# Patient Record
Sex: Female | Born: 1955 | Race: White | Hispanic: No | Marital: Married | Smoking: Former smoker
Health system: Southern US, Community
[De-identification: ages and names within clinical notes are randomized; demographics above are authoritative.]

## PROBLEM LIST (undated history)

## (undated) DIAGNOSIS — F419 Anxiety disorder, unspecified: Secondary | ICD-10-CM

## (undated) DIAGNOSIS — K219 Gastro-esophageal reflux disease without esophagitis: Secondary | ICD-10-CM

## (undated) DIAGNOSIS — I10 Essential (primary) hypertension: Secondary | ICD-10-CM

## (undated) DIAGNOSIS — F32A Depression, unspecified: Secondary | ICD-10-CM

## (undated) DIAGNOSIS — M1990 Unspecified osteoarthritis, unspecified site: Secondary | ICD-10-CM

## (undated) DIAGNOSIS — R0600 Dyspnea, unspecified: Secondary | ICD-10-CM

## (undated) DIAGNOSIS — C801 Malignant (primary) neoplasm, unspecified: Secondary | ICD-10-CM

## (undated) DIAGNOSIS — G709 Myoneural disorder, unspecified: Secondary | ICD-10-CM

## (undated) DIAGNOSIS — J189 Pneumonia, unspecified organism: Secondary | ICD-10-CM

## (undated) DIAGNOSIS — R634 Abnormal weight loss: Secondary | ICD-10-CM

## (undated) DIAGNOSIS — N189 Chronic kidney disease, unspecified: Secondary | ICD-10-CM

## (undated) DIAGNOSIS — M353 Polymyalgia rheumatica: Secondary | ICD-10-CM

## (undated) DIAGNOSIS — F329 Major depressive disorder, single episode, unspecified: Secondary | ICD-10-CM

## (undated) DIAGNOSIS — D649 Anemia, unspecified: Secondary | ICD-10-CM

## (undated) DIAGNOSIS — E079 Disorder of thyroid, unspecified: Secondary | ICD-10-CM

## (undated) DIAGNOSIS — E039 Hypothyroidism, unspecified: Secondary | ICD-10-CM

## (undated) DIAGNOSIS — G4700 Insomnia, unspecified: Secondary | ICD-10-CM

## (undated) DIAGNOSIS — M109 Gout, unspecified: Secondary | ICD-10-CM

## (undated) HISTORY — DX: Gout, unspecified: M10.9

## (undated) HISTORY — PX: KNEE ARTHROSCOPY: SUR90

## (undated) HISTORY — PX: GASTRIC BYPASS: SHX52

## (undated) HISTORY — PX: EYE SURGERY: SHX253

## (undated) HISTORY — PX: CARPAL TUNNEL RELEASE: SHX101

## (undated) HISTORY — DX: Gastro-esophageal reflux disease without esophagitis: K21.9

## (undated) HISTORY — DX: Depression, unspecified: F32.A

## (undated) HISTORY — PX: JOINT REPLACEMENT: SHX530

## (undated) HISTORY — PX: TOTAL HIP ARTHROPLASTY: SHX124

## (undated) HISTORY — PX: COLONOSCOPY: SHX174

## (undated) HISTORY — PX: TONSILLECTOMY AND ADENOIDECTOMY: SHX28

## (undated) HISTORY — DX: Major depressive disorder, single episode, unspecified: F32.9

## (undated) HISTORY — DX: Disorder of thyroid, unspecified: E07.9

## (undated) HISTORY — DX: Abnormal weight loss: R63.4

---

## 2003-12-19 DIAGNOSIS — E668 Other obesity: Secondary | ICD-10-CM | POA: Insufficient documentation

## 2005-04-01 DIAGNOSIS — M25552 Pain in left hip: Secondary | ICD-10-CM | POA: Insufficient documentation

## 2006-04-19 DIAGNOSIS — G894 Chronic pain syndrome: Secondary | ICD-10-CM | POA: Insufficient documentation

## 2009-01-16 DIAGNOSIS — E034 Atrophy of thyroid (acquired): Secondary | ICD-10-CM | POA: Insufficient documentation

## 2010-07-27 DIAGNOSIS — N189 Chronic kidney disease, unspecified: Secondary | ICD-10-CM

## 2010-07-27 HISTORY — DX: Chronic kidney disease, unspecified: N18.9

## 2010-09-18 DIAGNOSIS — Z9889 Other specified postprocedural states: Secondary | ICD-10-CM | POA: Insufficient documentation

## 2010-09-18 DIAGNOSIS — Z96649 Presence of unspecified artificial hip joint: Secondary | ICD-10-CM | POA: Insufficient documentation

## 2011-09-10 DIAGNOSIS — H81319 Aural vertigo, unspecified ear: Secondary | ICD-10-CM | POA: Insufficient documentation

## 2012-01-04 DIAGNOSIS — M353 Polymyalgia rheumatica: Secondary | ICD-10-CM | POA: Insufficient documentation

## 2012-01-04 DIAGNOSIS — Z8679 Personal history of other diseases of the circulatory system: Secondary | ICD-10-CM | POA: Insufficient documentation

## 2012-10-13 DIAGNOSIS — R0902 Hypoxemia: Secondary | ICD-10-CM | POA: Insufficient documentation

## 2012-12-27 DIAGNOSIS — N179 Acute kidney failure, unspecified: Secondary | ICD-10-CM | POA: Insufficient documentation

## 2015-02-05 DIAGNOSIS — R0602 Shortness of breath: Secondary | ICD-10-CM | POA: Insufficient documentation

## 2015-02-05 DIAGNOSIS — E785 Hyperlipidemia, unspecified: Secondary | ICD-10-CM | POA: Insufficient documentation

## 2015-02-05 DIAGNOSIS — R0609 Other forms of dyspnea: Secondary | ICD-10-CM | POA: Insufficient documentation

## 2016-01-24 DIAGNOSIS — Z9884 Bariatric surgery status: Secondary | ICD-10-CM | POA: Insufficient documentation

## 2016-01-25 HISTORY — PX: DG GALL BLADDER: HXRAD326

## 2017-03-09 DIAGNOSIS — N841 Polyp of cervix uteri: Secondary | ICD-10-CM | POA: Insufficient documentation

## 2017-08-11 ENCOUNTER — Other Ambulatory Visit: Payer: Self-pay

## 2017-08-11 ENCOUNTER — Encounter: Payer: Self-pay | Admitting: Family Medicine

## 2017-08-11 ENCOUNTER — Other Ambulatory Visit
Admission: RE | Admit: 2017-08-11 | Discharge: 2017-08-11 | Disposition: A | Discharge status: Home / Self Care | Payer: Medicare HMO | Source: Ambulatory Visit | Attending: Family Medicine | Admitting: Family Medicine

## 2017-08-11 ENCOUNTER — Ambulatory Visit (INDEPENDENT_AMBULATORY_CARE_PROVIDER_SITE_OTHER): Payer: Medicare HMO | Admitting: Family Medicine

## 2017-08-11 VITALS — BP 118/76 | HR 88 | Resp 16 | Ht 63.0 in | Wt 161.4 lb

## 2017-08-11 DIAGNOSIS — E559 Vitamin D deficiency, unspecified: Secondary | ICD-10-CM

## 2017-08-11 DIAGNOSIS — I10 Essential (primary) hypertension: Secondary | ICD-10-CM | POA: Insufficient documentation

## 2017-08-11 DIAGNOSIS — Z79890 Hormone replacement therapy (postmenopausal): Secondary | ICD-10-CM | POA: Insufficient documentation

## 2017-08-11 DIAGNOSIS — M1A0790 Idiopathic chronic gout, unspecified ankle and foot, without tophus (tophi): Secondary | ICD-10-CM | POA: Diagnosis not present

## 2017-08-11 DIAGNOSIS — M159 Polyosteoarthritis, unspecified: Secondary | ICD-10-CM | POA: Diagnosis not present

## 2017-08-11 DIAGNOSIS — F3289 Other specified depressive episodes: Secondary | ICD-10-CM | POA: Diagnosis not present

## 2017-08-11 DIAGNOSIS — M1990 Unspecified osteoarthritis, unspecified site: Secondary | ICD-10-CM | POA: Diagnosis not present

## 2017-08-11 DIAGNOSIS — G4700 Insomnia, unspecified: Secondary | ICD-10-CM

## 2017-08-11 DIAGNOSIS — Z8739 Personal history of other diseases of the musculoskeletal system and connective tissue: Secondary | ICD-10-CM | POA: Diagnosis not present

## 2017-08-11 DIAGNOSIS — M109 Gout, unspecified: Secondary | ICD-10-CM | POA: Insufficient documentation

## 2017-08-11 DIAGNOSIS — Z87891 Personal history of nicotine dependence: Secondary | ICD-10-CM | POA: Diagnosis not present

## 2017-08-11 DIAGNOSIS — Z79899 Other long term (current) drug therapy: Secondary | ICD-10-CM | POA: Insufficient documentation

## 2017-08-11 DIAGNOSIS — E039 Hypothyroidism, unspecified: Secondary | ICD-10-CM | POA: Diagnosis not present

## 2017-08-11 DIAGNOSIS — M353 Polymyalgia rheumatica: Secondary | ICD-10-CM | POA: Diagnosis not present

## 2017-08-11 DIAGNOSIS — F3341 Major depressive disorder, recurrent, in partial remission: Secondary | ICD-10-CM

## 2017-08-11 DIAGNOSIS — E538 Deficiency of other specified B group vitamins: Secondary | ICD-10-CM | POA: Insufficient documentation

## 2017-08-11 DIAGNOSIS — Z23 Encounter for immunization: Secondary | ICD-10-CM

## 2017-08-11 LAB — SEDIMENTATION RATE: Sed Rate: 21 mm/hr (ref 0–30)

## 2017-08-11 LAB — COMPREHENSIVE METABOLIC PANEL
ALT: 23 U/L (ref 14–54)
AST: 25 U/L (ref 15–41)
Albumin: 4.4 g/dL (ref 3.5–5.0)
Alkaline Phosphatase: 126 U/L (ref 38–126)
Anion gap: 7 (ref 5–15)
BUN: 21 mg/dL — ABNORMAL HIGH (ref 6–20)
CHLORIDE: 104 mmol/L (ref 101–111)
CO2: 25 mmol/L (ref 22–32)
Calcium: 9.2 mg/dL (ref 8.9–10.3)
Creatinine, Ser: 0.83 mg/dL (ref 0.44–1.00)
Glucose, Bld: 88 mg/dL (ref 65–99)
POTASSIUM: 4 mmol/L (ref 3.5–5.1)
Sodium: 136 mmol/L (ref 135–145)
Total Bilirubin: 0.8 mg/dL (ref 0.3–1.2)
Total Protein: 7.1 g/dL (ref 6.5–8.1)

## 2017-08-11 LAB — CBC
HCT: 36.3 % (ref 35.0–47.0)
Hemoglobin: 12.4 g/dL (ref 12.0–16.0)
MCH: 33.1 pg (ref 26.0–34.0)
MCHC: 34.1 g/dL (ref 32.0–36.0)
MCV: 96.9 fL (ref 80.0–100.0)
PLATELETS: 228 10*3/uL (ref 150–440)
RBC: 3.75 MIL/uL — ABNORMAL LOW (ref 3.80–5.20)
RDW: 14.7 % — AB (ref 11.5–14.5)
WBC: 7 10*3/uL (ref 3.6–11.0)

## 2017-08-11 LAB — LIPID PANEL
CHOL/HDL RATIO: 2.4 ratio
CHOLESTEROL: 175 mg/dL (ref 0–200)
HDL: 72 mg/dL (ref >40)
LDL Cholesterol: 86 mg/dL (ref 0–99)
Triglycerides: 84 mg/dL (ref <150)
VLDL: 17 mg/dL (ref 0–40)

## 2017-08-11 LAB — VITAMIN B12: Vitamin B-12: 833 pg/mL (ref 180–914)

## 2017-08-11 LAB — TSH: TSH: 1.392 u[IU]/mL (ref 0.350–4.500)

## 2017-08-11 LAB — URIC ACID: URIC ACID, SERUM: 3.6 mg/dL (ref 2.3–6.6)

## 2017-08-12 ENCOUNTER — Other Ambulatory Visit: Payer: Self-pay | Admitting: Family Medicine

## 2017-08-12 DIAGNOSIS — F5105 Insomnia due to other mental disorder: Secondary | ICD-10-CM | POA: Insufficient documentation

## 2017-08-12 DIAGNOSIS — F329 Major depressive disorder, single episode, unspecified: Secondary | ICD-10-CM | POA: Insufficient documentation

## 2017-08-12 DIAGNOSIS — G4700 Insomnia, unspecified: Secondary | ICD-10-CM

## 2017-08-12 DIAGNOSIS — F32A Depression, unspecified: Secondary | ICD-10-CM | POA: Insufficient documentation

## 2017-08-12 LAB — VITAMIN D 25 HYDROXY (VIT D DEFICIENCY, FRACTURES): VIT D 25 HYDROXY: 58.4 ng/mL (ref 30.0–100.0)

## 2017-08-12 NOTE — Progress Notes (Signed)
Date:  08/11/2017   Name:  Deborah Jimenez   DOB:  10/03/1955   MRN:  [id number]  PCP:  Adline Potter, MD    Chief Complaint: Establish Care   History of Present Illness:  This is a 62 y.o. female seen for initial visit. Moved here from [place] in Nov. S/p gastric bypass 2017, weight down 135#, still losing. Uses walker s/p B THR, most recent 2014. Hx PMR on chronic prednisone in past contributing to joint problems. Uses Elavil 75 mg qhs, was on 25 qhs but increased due to insomnia associated with move., takes Ambien about once a week, on B12/Mg/iron and high dose multivit since 2017. Depression on Effexor x 7 yrs, Wellbutrin XL x 1 yr, seems to help. On Protonix since surgery but thinks can stop. On Mobic x 5 yrs, allopurinol x 2 yrs after gout in big toe, has never tried regular Tylenol. Takes Vicodin prn pain about 1/2 tab per month. Synthroid x 6 yrs TSH ok in July. Biotin for hair/skin. Imms UTD except Prevnar, mammo and colo per chart.  Review of Systems:  Review of Systems  Constitutional: Negative for chills and fever.  HENT: Negative for ear pain and sore throat.   Eyes: Negative for pain.  Respiratory: Negative for cough and shortness of breath.   Cardiovascular: Negative for chest pain and leg swelling.  Gastrointestinal: Negative for abdominal pain, constipation and diarrhea.  Genitourinary: Negative for difficulty urinating.  Neurological: Negative for syncope and light-headedness.  Hematological: Negative for adenopathy.    Patient Active Problem List   Diagnosis Date Noted  . Gout 08/11/2017  . Hypertension 08/11/2017  . Hypothyroidism 08/11/2017  . B12 deficiency 08/11/2017  . Vitamin D deficiency 08/11/2017  . History of polymyalgia rheumatica 08/11/2017  . Osteoarthritis 08/11/2017    Prior to Admission medications   Medication Sig Start Date End Date Taking? Authorizing Provider  acetaminophen (TYLENOL) 500 MG tablet Take 1,000 mg by mouth 2 (two) times  daily.   Yes [provider]  allopurinol (ZYLOPRIM) 300 MG tablet Take 1 tablet by mouth daily. 07/15/17  Yes [provider]  amitriptyline (ELAVIL) 25 MG tablet Take 3 tablets by mouth daily. 06/09/17  Yes [provider]  BIOTIN PO Take 1 tablet by mouth daily.   Yes [provider]  buPROPion (WELLBUTRIN XL) 150 MG 24 hr tablet Take 2 tablets by mouth daily.  08/10/17  Yes [provider]  calcium carbonate (TUMS - DOSED IN MG ELEMENTAL CALCIUM) 500 MG chewable tablet Chew 1 tablet by mouth 2 (two) times daily.   Yes [provider]  Cholecalciferol (VITAMIN D3) 5000 units TABS Take by mouth 2 (two) times a week.   Yes [provider]  EPINEPHrine 0.3 mg/0.3 mL IJ SOAJ injection Inject into the muscle as needed.   Yes [provider]  ferrous sulfate ([employer] IRON) 325 (65 FE) MG tablet Take 325 mg by mouth daily with breakfast.   Yes [provider]  HYDROcodone-acetaminophen (NORCO/VICODIN) 5-325 MG tablet Take 1 tablet by mouth every 8 (eight) hours as needed. 05/14/17  Yes [provider]  levothyroxine (SYNTHROID, LEVOTHROID) 200 MCG tablet Take 1 tablet by mouth daily. 06/10/17  Yes [provider]  levothyroxine (SYNTHROID, LEVOTHROID) 25 MCG tablet Take 1 tablet by mouth daily. 07/15/17  Yes [provider]  lisinopril (PRINIVIL,ZESTRIL) 40 MG tablet Take 0.5 tablets by mouth daily. 07/23/17  Yes [provider]  MAGNESIUM ASPARTATE PO Take 500 mg by  mouth daily.   Yes [provider]  Melatonin 3 MG TABS Take 1 tablet by mouth at bedtime.   Yes [provider]  Multiple Vitamins-Minerals (ALIVE WOMENS GUMMY PO) Take 4 capsules by mouth daily.   Yes [provider]  spironolactone (ALDACTONE) 50 MG tablet Take 1 tablet by mouth daily. 08/10/17  Yes [provider]  venlafaxine (EFFEXOR) 25 MG tablet Take 1 tablet by mouth 2 (two) times daily.  05/31/17  Yes [provider]  vitamin B-12 (CYANOCOBALAMIN) 500 MCG tablet Take 250 mcg by mouth 2 (two) times a week.    Yes [provider]  zolpidem (AMBIEN) 10 MG tablet Take 10 mg by mouth at bedtime as needed for sleep.   Yes [provider]    Allergies  Allergen Reactions  . Bee Pollen   . Penicillins     Past Surgical History:  Procedure Laterality Date  . CESAREAN SECTION    . DG GALL BLADDER    . GASTRIC BYPASS    . TONSILLECTOMY AND ADENOIDECTOMY    . TOTAL HIP ARTHROPLASTY Left    x 2    Social History   Tobacco Use  . Smoking status: Former Smoker    Last attempt to quit: 01/24/2010    Years since quitting: 7.5  . Smokeless tobacco: Never Used  Substance Use Topics  . Alcohol use: Yes    Frequency: Never  . Drug use: No    Family History  Family history unknown: Yes    Medication list has been reviewed and updated.  Physical Examination: BP 118/76   Pulse 88   Resp 16   Ht '5\' 3"'  (1.6 m)   Wt 161 lb 6.4 oz (73.2 kg)   SpO2 98%   BMI 28.59 kg/m   Physical Exam  Constitutional: She is oriented to person, place, and time. She appears well-developed and well-nourished.  HENT:  Head: Normocephalic and atraumatic.  Right Ear: External ear normal.  Left Ear: External ear normal.  Nose: Nose normal.  Mouth/Throat: Oropharynx is clear and moist.  TMs clear  Eyes: Conjunctivae and EOM are normal. Pupils are equal, round, and reactive to light. No scleral icterus.  Neck: Neck supple. No thyromegaly present.  Cardiovascular: Normal rate, regular rhythm and normal heart sounds.  Pulmonary/Chest: Effort normal and breath sounds normal.  Abdominal: Soft. She exhibits no distension and no mass. There is no tenderness.  Musculoskeletal: She exhibits no edema.  Lymphadenopathy:    She has no cervical adenopathy.  Neurological: She is alert and oriented to person, place, and time. Coordination normal.  Romberg positive, gait  unsteady due to pain  Skin: Skin is warm and dry.  Psychiatric: She has a normal mood and affect. Her behavior is normal.  Nursing note and vitals reviewed.   Assessment and Plan:  1. Essential hypertension Well controlled on lisinopril/Aldactone - Comprehensive Metabolic Panel (CMET) - CBC - Lipid Profile  2. Hypothyroidism, unspecified type Well controlled on Synthroid - TSH  3. Chronic idiopathic gout involving toe without tophus, unspecified laterality Well controlled on allopurinol - Uric acid  4. Osteoarthritis of multiple joints, unspecified osteoarthritis type S/p steroid arthropathy, B THR, change Mobic to Tylenol 1000 mg bid - Ambulatory referral to Physical Therapy  5. Vitamin D deficiency On supplement - Vitamin D (25 hydroxy)  6. B12 deficiency On supplement - B12  7. Insomnia, unspecified type Taper off Elavil, cont melatonin, prn Ambien for now  8. Recurrent major depressive  disorder, in partial remission ([place]) Well controlled on Effexor/Wellbutrin XL, consider taper once off Elavil  9. History of polymyalgia rheumatica - Sed Rate (ESR)  10. Need for influenza vaccination - Flu Vaccine QUAD 6+ mos PF IM (Fluarix Quad PF)  11. Need for pneumococcal vaccination - Pneumococcal conjugate vaccine 13-valent  12. Med review D/c Protonix, consider d/c iron/Mg  Return in about 4 weeks (around 09/08/2017).   One hour spent with pt over half in counseling  Vern[facility]  08/12/2017

## 2017-08-18 ENCOUNTER — Ambulatory Visit: Payer: Medicare HMO | Admitting: Physical Therapy

## 2017-09-02 DIAGNOSIS — M25551 Pain in right hip: Secondary | ICD-10-CM | POA: Diagnosis not present

## 2017-09-02 DIAGNOSIS — M25562 Pain in left knee: Secondary | ICD-10-CM | POA: Diagnosis not present

## 2017-09-02 DIAGNOSIS — M25561 Pain in right knee: Secondary | ICD-10-CM | POA: Diagnosis not present

## 2017-09-02 DIAGNOSIS — M25552 Pain in left hip: Secondary | ICD-10-CM | POA: Diagnosis not present

## 2017-09-02 DIAGNOSIS — R262 Difficulty in walking, not elsewhere classified: Secondary | ICD-10-CM | POA: Diagnosis not present

## 2017-09-03 ENCOUNTER — Encounter: Payer: Self-pay | Admitting: Family Medicine

## 2017-09-03 ENCOUNTER — Other Ambulatory Visit: Payer: Self-pay | Admitting: Family Medicine

## 2017-09-03 MED ORDER — PANTOPRAZOLE SODIUM 40 MG PO TBEC: 40.0000 mg | DELAYED_RELEASE_TABLET | Freq: Every day | ORAL | 3 refills | Status: DC

## 2017-09-03 NOTE — Telephone Encounter (Signed)
Patient mychart message.

## 2017-09-06 DIAGNOSIS — M25551 Pain in right hip: Secondary | ICD-10-CM | POA: Diagnosis not present

## 2017-09-06 DIAGNOSIS — R262 Difficulty in walking, not elsewhere classified: Secondary | ICD-10-CM | POA: Diagnosis not present

## 2017-09-06 DIAGNOSIS — M25561 Pain in right knee: Secondary | ICD-10-CM | POA: Diagnosis not present

## 2017-09-06 DIAGNOSIS — M25562 Pain in left knee: Secondary | ICD-10-CM | POA: Diagnosis not present

## 2017-09-06 DIAGNOSIS — M25552 Pain in left hip: Secondary | ICD-10-CM | POA: Diagnosis not present

## 2017-09-08 ENCOUNTER — Encounter: Payer: Self-pay | Admitting: Family Medicine

## 2017-09-08 ENCOUNTER — Ambulatory Visit (INDEPENDENT_AMBULATORY_CARE_PROVIDER_SITE_OTHER): Payer: Medicare HMO | Admitting: Family Medicine

## 2017-09-08 VITALS — Ht 63.0 in | Wt 163.0 lb

## 2017-09-08 DIAGNOSIS — G4700 Insomnia, unspecified: Secondary | ICD-10-CM

## 2017-09-08 DIAGNOSIS — M25561 Pain in right knee: Secondary | ICD-10-CM | POA: Diagnosis not present

## 2017-09-08 DIAGNOSIS — M25551 Pain in right hip: Secondary | ICD-10-CM | POA: Diagnosis not present

## 2017-09-08 DIAGNOSIS — M159 Polyosteoarthritis, unspecified: Secondary | ICD-10-CM | POA: Diagnosis not present

## 2017-09-08 DIAGNOSIS — E039 Hypothyroidism, unspecified: Secondary | ICD-10-CM

## 2017-09-08 DIAGNOSIS — M1A0790 Idiopathic chronic gout, unspecified ankle and foot, without tophus (tophi): Secondary | ICD-10-CM

## 2017-09-08 DIAGNOSIS — I10 Essential (primary) hypertension: Secondary | ICD-10-CM

## 2017-09-08 DIAGNOSIS — F3341 Major depressive disorder, recurrent, in partial remission: Secondary | ICD-10-CM

## 2017-09-08 DIAGNOSIS — E538 Deficiency of other specified B group vitamins: Secondary | ICD-10-CM | POA: Diagnosis not present

## 2017-09-08 DIAGNOSIS — M7522 Bicipital tendinitis, left shoulder: Secondary | ICD-10-CM | POA: Diagnosis not present

## 2017-09-08 DIAGNOSIS — K219 Gastro-esophageal reflux disease without esophagitis: Secondary | ICD-10-CM

## 2017-09-08 DIAGNOSIS — E559 Vitamin D deficiency, unspecified: Secondary | ICD-10-CM

## 2017-09-08 DIAGNOSIS — R262 Difficulty in walking, not elsewhere classified: Secondary | ICD-10-CM | POA: Diagnosis not present

## 2017-09-08 DIAGNOSIS — M25552 Pain in left hip: Secondary | ICD-10-CM | POA: Diagnosis not present

## 2017-09-08 DIAGNOSIS — M25562 Pain in left knee: Secondary | ICD-10-CM | POA: Diagnosis not present

## 2017-09-08 MED ORDER — CELECOXIB 100 MG PO CAPS: 100.0000 mg | ORAL_CAPSULE | Freq: Two times a day (BID) | ORAL | 0 refills | Status: DC

## 2017-09-08 MED ORDER — NAPROXEN 500 MG PO TABS: 500.0000 mg | ORAL_TABLET | Freq: Two times a day (BID) | ORAL | 0 refills | Status: DC

## 2017-09-08 NOTE — Patient Instructions (Signed)
You have biceps tendonitis. Anti-inflammatory medication sent to pharmacy, use for two weeks. Stop spironolactone.

## 2017-09-09 ENCOUNTER — Encounter: Payer: Self-pay | Admitting: Family Medicine

## 2017-09-09 DIAGNOSIS — K219 Gastro-esophageal reflux disease without esophagitis: Secondary | ICD-10-CM | POA: Insufficient documentation

## 2017-09-09 DIAGNOSIS — Z9884 Bariatric surgery status: Secondary | ICD-10-CM | POA: Insufficient documentation

## 2017-09-09 NOTE — Progress Notes (Signed)
Date:  09/08/2017   Name:  Deborah Jimenez   DOB:  09/28/1955   MRN:  [id number]  PCP:  Adline Potter, MD    Chief Complaint: Follow-up and Arm Pain (Left arm. States it has been hurting since she recieved pneumonia shot. )   History of Present Illness:  This is a 62 y.o. female seen for one month f/u from initial visit. Elavil stopped and Mobic changed to Tylenol bid, sleeping ok but feels OA pain worse off Mobic, seeing PT which is helping. Also tried stopping Protonix but GERD sxs recurred so restarted. Feels she needs to stay on iron/Mg/Ca supplements given her prior bariatric surgery. Blood work last visit ok. C/o L upper arm pain that started one week after receiving shot, hurts to lift or flex arm. Also gets lightheaded on standing intermittently.   Review of Systems:  Review of Systems  Constitutional: Negative for chills and fever.  Respiratory: Negative for cough and shortness of breath.   Cardiovascular: Negative for chest pain and leg swelling.  Gastrointestinal: Negative for abdominal pain.  Genitourinary: Negative for difficulty urinating.  Neurological: Negative for syncope.    Patient Active Problem List   Diagnosis Date Noted  . Insomnia 08/12/2017  . Depression 08/12/2017  . Gout 08/11/2017  . Hypertension 08/11/2017  . Hypothyroidism 08/11/2017  . B12 deficiency 08/11/2017  . Vitamin D deficiency 08/11/2017  . History of polymyalgia rheumatica 08/11/2017  . Osteoarthritis 08/11/2017    Prior to Admission medications   Medication Sig Start Date End Date Taking? Authorizing Provider  acetaminophen (TYLENOL) 500 MG tablet Take 1,000 mg by mouth 2 (two) times daily.   Yes [provider]  allopurinol (ZYLOPRIM) 300 MG tablet Take 1 tablet by mouth daily. 07/15/17  Yes [provider]  BIOTIN PO Take 1 tablet by mouth daily.   Yes [provider]  buPROPion (WELLBUTRIN XL) 150 MG 24 hr tablet Take 2 tablets by mouth daily.   08/10/17  Yes [provider]  Cholecalciferol (VITAMIN D3) 5000 units TABS Take by mouth 2 (two) times a week.   Yes [provider]  EPINEPHrine 0.3 mg/0.3 mL IJ SOAJ injection Inject into the muscle as needed.   Yes [provider]  HYDROcodone-acetaminophen (NORCO/VICODIN) 5-325 MG tablet Take 1 tablet by mouth every 8 (eight) hours as needed. 05/14/17  Yes [provider]  levothyroxine (SYNTHROID, LEVOTHROID) 200 MCG tablet Take 1 tablet by mouth daily. 06/10/17  Yes [provider]  levothyroxine (SYNTHROID, LEVOTHROID) 25 MCG tablet Take 1 tablet by mouth daily. 07/15/17  Yes [provider]  lisinopril (PRINIVIL,ZESTRIL) 40 MG tablet Take 0.5 tablets by mouth daily. 07/23/17  Yes [provider]  Melatonin 3 MG TABS Take 1 tablet by mouth at bedtime.   Yes [provider]  Multiple Vitamins-Minerals (ALIVE WOMENS GUMMY PO) Take 4 capsules by mouth daily.   Yes [provider]  pantoprazole (PROTONIX) 40 MG tablet Take 1 tablet (40 mg total) by mouth daily. 09/03/17  Yes Stefannie Defeo, Gwyndolyn Saxon, MD  venlafaxine (EFFEXOR) 25 MG tablet Take 1 tablet by mouth 2 (two) times daily. 05/31/17  Yes [provider]  vitamin B-12 (CYANOCOBALAMIN) 500 MCG tablet Take 250 mcg by mouth 2 (two) times a week.    Yes [provider]  celecoxib (CELEBREX) 100 MG capsule Take 1 capsule (100 mg total) by mouth 2 (two) times daily. 09/08/17   Adline Potter, MD    Allergies  Allergen Reactions  . Bee  Pollen   . Penicillins     Past Surgical History:  Procedure Laterality Date  . CESAREAN SECTION    . DG GALL BLADDER    . GASTRIC BYPASS    . TONSILLECTOMY AND ADENOIDECTOMY    . TOTAL HIP ARTHROPLASTY Left    x 2    Social History   Tobacco Use  . Smoking status: Former Smoker    Last attempt to quit: 01/24/2010    Years since quitting: 7.6  . Smokeless tobacco: Never Used  Substance Use Topics  . Alcohol use:  Yes    Frequency: Never  . Drug use: No    Family History  Family history unknown: Yes    Medication list has been reviewed and updated.  Physical Examination: Ht 5\' 3"  (1.6 m)   Wt 163 lb (73.9 kg)   SpO2 99%   BMI 28.87 kg/m   Physical Exam  Constitutional: She appears well-developed and well-nourished.  Cardiovascular: Normal rate, regular rhythm and normal heart sounds.  Pulmonary/Chest: Effort normal and breath sounds normal.  Musculoskeletal: She exhibits no edema.  Markedly tender over L biceps tendon  Neurological: She is alert.  Skin: Skin is warm and dry.  Psychiatric: She has a normal mood and affect. Her behavior is normal.  Nursing note and vitals reviewed.   Assessment and Plan:  1. Recurrent major depressive disorder, in partial remission (HCC) Stable on Effexor/Wellbutrin XL, consider increased Effexor (may help with chronic pain), taper Wellbutrin next visit  2. Biceps tendonitis, left Celebrex 100 mg bid x 2 wks only (intolerant Naprosyn), pt to discuss with PT  3. Essential hypertension Orthostatics positive, d/c Aldactone, cont lisinopril  4. Hypothyroidism, unspecified type Well controlled on Synthroid  5. Osteoarthritis of multiple joints, unspecified osteoarthritis type Pain worse off Mobic, cont Tylenol bid, may improve with short-term Celebrex and further PT  6. Chronic idiopathic gout involving toe without tophus, unspecified laterality Well controlled on allopurinol  7. Insomnia, unspecified type Stable off Elavil, on melatonin, d/c Ambien  8. Gastroesophageal reflux disease, esophagitis presence not specified Intolerant Protonix d/c, consider taper or change to H2B in future  9. B12 deficiency Well controlled on supplement  10. Vitamin D deficiency Well controlled on supplement  11. Med review Prefers to stay on iron/Mg/Ca supplements given prior bariatric surgery  Return in about 4 weeks (around 10/06/2017).  Satira Anis.  [facility]  09/09/2017

## 2017-09-11 ENCOUNTER — Encounter: Payer: Self-pay | Admitting: Family Medicine

## 2017-09-13 DIAGNOSIS — R262 Difficulty in walking, not elsewhere classified: Secondary | ICD-10-CM | POA: Diagnosis not present

## 2017-09-13 DIAGNOSIS — M25561 Pain in right knee: Secondary | ICD-10-CM | POA: Diagnosis not present

## 2017-09-13 DIAGNOSIS — M25551 Pain in right hip: Secondary | ICD-10-CM | POA: Diagnosis not present

## 2017-09-13 DIAGNOSIS — M25552 Pain in left hip: Secondary | ICD-10-CM | POA: Diagnosis not present

## 2017-09-13 DIAGNOSIS — M25562 Pain in left knee: Secondary | ICD-10-CM | POA: Diagnosis not present

## 2017-09-13 NOTE — Telephone Encounter (Signed)
Patient mychart message. Please Advise.

## 2017-09-15 DIAGNOSIS — M25552 Pain in left hip: Secondary | ICD-10-CM | POA: Diagnosis not present

## 2017-09-15 DIAGNOSIS — M25562 Pain in left knee: Secondary | ICD-10-CM | POA: Diagnosis not present

## 2017-09-15 DIAGNOSIS — M25551 Pain in right hip: Secondary | ICD-10-CM | POA: Diagnosis not present

## 2017-09-15 DIAGNOSIS — M25561 Pain in right knee: Secondary | ICD-10-CM | POA: Diagnosis not present

## 2017-09-15 DIAGNOSIS — R262 Difficulty in walking, not elsewhere classified: Secondary | ICD-10-CM | POA: Diagnosis not present

## 2017-09-20 DIAGNOSIS — M25552 Pain in left hip: Secondary | ICD-10-CM | POA: Diagnosis not present

## 2017-09-20 DIAGNOSIS — M25551 Pain in right hip: Secondary | ICD-10-CM | POA: Diagnosis not present

## 2017-09-20 DIAGNOSIS — R262 Difficulty in walking, not elsewhere classified: Secondary | ICD-10-CM | POA: Diagnosis not present

## 2017-09-20 DIAGNOSIS — M25561 Pain in right knee: Secondary | ICD-10-CM | POA: Diagnosis not present

## 2017-09-20 DIAGNOSIS — M25562 Pain in left knee: Secondary | ICD-10-CM | POA: Diagnosis not present

## 2017-09-22 DIAGNOSIS — M25562 Pain in left knee: Secondary | ICD-10-CM | POA: Diagnosis not present

## 2017-09-22 DIAGNOSIS — R262 Difficulty in walking, not elsewhere classified: Secondary | ICD-10-CM | POA: Diagnosis not present

## 2017-09-22 DIAGNOSIS — M25551 Pain in right hip: Secondary | ICD-10-CM | POA: Diagnosis not present

## 2017-09-22 DIAGNOSIS — M25552 Pain in left hip: Secondary | ICD-10-CM | POA: Diagnosis not present

## 2017-09-22 DIAGNOSIS — M25561 Pain in right knee: Secondary | ICD-10-CM | POA: Diagnosis not present

## 2017-09-24 ENCOUNTER — Other Ambulatory Visit: Payer: Self-pay

## 2017-09-24 ENCOUNTER — Other Ambulatory Visit: Payer: Self-pay | Admitting: Family Medicine

## 2017-09-24 MED ORDER — CELECOXIB 100 MG PO CAPS: 100.0000 mg | ORAL_CAPSULE | Freq: Two times a day (BID) | ORAL | 0 refills | Status: DC

## 2017-09-27 ENCOUNTER — Encounter: Payer: Self-pay | Admitting: Family Medicine

## 2017-09-27 ENCOUNTER — Ambulatory Visit (INDEPENDENT_AMBULATORY_CARE_PROVIDER_SITE_OTHER): Payer: Medicare HMO | Admitting: Family Medicine

## 2017-09-27 VITALS — BP 128/78 | HR 68 | Temp 98.2°F | Resp 16 | Ht 63.0 in | Wt 159.0 lb

## 2017-09-27 DIAGNOSIS — F3341 Major depressive disorder, recurrent, in partial remission: Secondary | ICD-10-CM | POA: Diagnosis not present

## 2017-09-27 DIAGNOSIS — M25552 Pain in left hip: Secondary | ICD-10-CM | POA: Diagnosis not present

## 2017-09-27 DIAGNOSIS — G4700 Insomnia, unspecified: Secondary | ICD-10-CM

## 2017-09-27 DIAGNOSIS — K219 Gastro-esophageal reflux disease without esophagitis: Secondary | ICD-10-CM | POA: Diagnosis not present

## 2017-09-27 DIAGNOSIS — Z9884 Bariatric surgery status: Secondary | ICD-10-CM

## 2017-09-27 DIAGNOSIS — M25561 Pain in right knee: Secondary | ICD-10-CM | POA: Diagnosis not present

## 2017-09-27 DIAGNOSIS — I10 Essential (primary) hypertension: Secondary | ICD-10-CM | POA: Diagnosis not present

## 2017-09-27 DIAGNOSIS — R262 Difficulty in walking, not elsewhere classified: Secondary | ICD-10-CM | POA: Diagnosis not present

## 2017-09-27 DIAGNOSIS — M25551 Pain in right hip: Secondary | ICD-10-CM | POA: Diagnosis not present

## 2017-09-27 DIAGNOSIS — E039 Hypothyroidism, unspecified: Secondary | ICD-10-CM

## 2017-09-27 DIAGNOSIS — H5712 Ocular pain, left eye: Secondary | ICD-10-CM | POA: Diagnosis not present

## 2017-09-27 DIAGNOSIS — M25562 Pain in left knee: Secondary | ICD-10-CM | POA: Diagnosis not present

## 2017-09-27 DIAGNOSIS — M159 Polyosteoarthritis, unspecified: Secondary | ICD-10-CM

## 2017-09-27 NOTE — Progress Notes (Signed)
Date:  09/27/2017   Name:  Deborah Jimenez   DOB:  18-Jun-1956   MRN:  [id number]  PCP:  Adline Potter, MD    Chief Complaint: Eye Problem ( L eye irritation...redness and dry on skin only. Itching and draining in morning. Some pain also. Not swollen and not hot. Never crusting over and started around 09/09/2017. Complains of some blurred vision and watery eye. )   History of Present Illness:  This is a 62 y.o. female seen same day for L eye pain past month, saw [facility], placed on olopatadine gtts without improvement, watery d/c but no purulence, worse end of day, no photophobia. Depression improving with increased activity. Off Aldactone for orthostasis. Still taking Elavil and Ambien for insomnia but has cut back. GERD sxs resolved back on Protonix. OA sxs improving with PT, biceps tendonitis also improved.  Review of Systems:  Review of Systems  Constitutional: Negative for chills and fever.  HENT: Negative for ear pain and sinus pain.   Respiratory: Negative for cough and shortness of breath.   Cardiovascular: Negative for chest pain and leg swelling.  Genitourinary: Negative for difficulty urinating.  Neurological: Negative for syncope and light-headedness.    Patient Active Problem List   Diagnosis Date Noted  . GERD (gastroesophageal reflux disease) 09/09/2017  . History of bariatric surgery 09/09/2017  . Insomnia 08/12/2017  . Depression 08/12/2017  . Gout 08/11/2017  . Hypertension 08/11/2017  . Hypothyroidism 08/11/2017  . B12 deficiency 08/11/2017  . Vitamin D deficiency 08/11/2017  . History of polymyalgia rheumatica 08/11/2017  . Osteoarthritis 08/11/2017    Prior to Admission medications   Medication Sig Start Date End Date Taking? Authorizing Provider  acetaminophen (TYLENOL) 500 MG tablet Take 1,000 mg by mouth 2 (two) times daily.   Yes [provider]  allopurinol (ZYLOPRIM) 300 MG tablet Take 1 tablet by mouth daily. 07/15/17  Yes [provider]  BIOTIN PO Take 1 tablet by mouth daily.   Yes [provider]  buPROPion (WELLBUTRIN XL) 150 MG 24 hr tablet Take 2 tablets by mouth daily.  08/10/17  Yes [provider]  Cholecalciferol (VITAMIN D3) 5000 units TABS Take by mouth 2 (two) times a week.   Yes [provider]  EPINEPHrine 0.3 mg/0.3 mL IJ SOAJ injection Inject into the muscle as needed.   Yes [provider]  HYDROcodone-acetaminophen (NORCO/VICODIN) 5-325 MG tablet Take 1 tablet by mouth every 8 (eight) hours as needed. 05/14/17  Yes [provider]  levothyroxine (SYNTHROID, LEVOTHROID) 200 MCG tablet Take 1 tablet by mouth daily. 06/10/17  Yes [provider]  levothyroxine (SYNTHROID, LEVOTHROID) 25 MCG tablet Take 1 tablet by mouth daily. 07/15/17  Yes [provider]  lisinopril (PRINIVIL,ZESTRIL) 40 MG tablet Take 0.5 tablets by mouth daily. 07/23/17  Yes [provider]  Melatonin 3 MG TABS Take 1 tablet by mouth at bedtime.   Yes [provider]  Multiple Vitamins-Minerals (ALIVE WOMENS GUMMY PO) Take 4 capsules by mouth daily.   Yes [provider]  pantoprazole (PROTONIX) 40 MG tablet Take 1 tablet (40 mg total) by mouth daily. 09/03/17  Yes Dnya Hickle, Gwyndolyn Saxon, MD  venlafaxine (EFFEXOR) 25 MG tablet Take 1 tablet by mouth 2 (two) times daily. 05/31/17  Yes [provider]  vitamin B-12 (CYANOCOBALAMIN) 500 MCG tablet Take 250 mcg by mouth 2 (two) times a week.    Yes [provider]    Allergies  Allergen Reactions  . Bee  Pollen   . Latex   . Naprosyn [Naproxen] Diarrhea  . Penicillins     Past Surgical History:  Procedure Laterality Date  . CESAREAN SECTION    . DG GALL BLADDER    . GASTRIC BYPASS    . TONSILLECTOMY AND ADENOIDECTOMY    . TOTAL HIP ARTHROPLASTY Left    x 2    Social History   Tobacco Use  . Smoking status: Former Smoker    Last attempt to quit: 01/24/2010    Years since  quitting: 7.6  . Smokeless tobacco: Never Used  Substance Use Topics  . Alcohol use: Yes    Frequency: Never  . Drug use: No    Family History  Family history unknown: Yes    Medication list has been reviewed and updated.  Physical Examination: BP 128/78   Pulse 68   Temp 98.2 F (36.8 C) (Oral)   Resp 16   Ht 5\' 3"  (1.6 m)   Wt 159 lb (72.1 kg)   SpO2 98%   BMI 28.17 kg/m   Physical Exam  Constitutional: She appears well-developed and well-nourished.  Eyes: EOM are normal. Pupils are equal, round, and reactive to light.  L periorbital erythema L conjunctiva sl injected No FB appreciated  Cardiovascular: Normal rate, regular rhythm and normal heart sounds.  Pulmonary/Chest: Effort normal and breath sounds normal.  Neurological: She is alert.  Skin: Skin is warm and dry.  Psychiatric: She has a normal mood and affect. Her behavior is normal.  Nursing note and vitals reviewed.   Assessment and Plan:  1. Left eye pain, persistent Unclear etiology, unresponsive to olopatadine gtts, will ask optho to see next day or two - Ambulatory referral to Ophthalmology  2. Recurrent major depressive disorder, in partial remission (HCC) Stable on Effexor/Wellbutrin XR, consider increasing Effexor and weaning Wellbutrin to help with chronic pain  3. Essential hypertension Improved off Aldactone, on lisinopril  4. Gastroesophageal reflux disease, esophagitis presence not specified Well controlled on Protonix, intolerant d/c  5. Hypothyroidism, unspecified type Well controlled on Synthroid  6. Insomnia, unspecified type D/c Ambien, try to wean off Elavil, consider change to Pamelor or Remeron if ineffective  7. Osteoarthritis of multiple joints, unspecified osteoarthritis type Improved with PT, Tylenol bid  8. History of bariatric surgery Prefers to stay on multiple supplements, B12, vit D levels ok  Return in about 1 week (around 10/04/2017).  Satira Anis. [facility]  09/27/2017

## 2017-09-28 DIAGNOSIS — G4700 Insomnia, unspecified: Secondary | ICD-10-CM | POA: Diagnosis not present

## 2017-09-28 DIAGNOSIS — Z6828 Body mass index (BMI) 28.0-28.9, adult: Secondary | ICD-10-CM | POA: Diagnosis not present

## 2017-09-28 DIAGNOSIS — E663 Overweight: Secondary | ICD-10-CM | POA: Diagnosis not present

## 2017-09-28 DIAGNOSIS — E039 Hypothyroidism, unspecified: Secondary | ICD-10-CM | POA: Diagnosis not present

## 2017-09-28 DIAGNOSIS — Z9049 Acquired absence of other specified parts of digestive tract: Secondary | ICD-10-CM | POA: Diagnosis not present

## 2017-09-28 DIAGNOSIS — F3340 Major depressive disorder, recurrent, in remission, unspecified: Secondary | ICD-10-CM | POA: Diagnosis not present

## 2017-09-28 DIAGNOSIS — M109 Gout, unspecified: Secondary | ICD-10-CM | POA: Diagnosis not present

## 2017-09-28 DIAGNOSIS — H04222 Epiphora due to insufficient drainage, left lacrimal gland: Secondary | ICD-10-CM | POA: Diagnosis not present

## 2017-09-28 DIAGNOSIS — K219 Gastro-esophageal reflux disease without esophagitis: Secondary | ICD-10-CM | POA: Diagnosis not present

## 2017-09-28 DIAGNOSIS — I10 Essential (primary) hypertension: Secondary | ICD-10-CM | POA: Diagnosis not present

## 2017-09-30 DIAGNOSIS — R262 Difficulty in walking, not elsewhere classified: Secondary | ICD-10-CM | POA: Diagnosis not present

## 2017-09-30 DIAGNOSIS — M25552 Pain in left hip: Secondary | ICD-10-CM | POA: Diagnosis not present

## 2017-09-30 DIAGNOSIS — M25562 Pain in left knee: Secondary | ICD-10-CM | POA: Diagnosis not present

## 2017-09-30 DIAGNOSIS — M25561 Pain in right knee: Secondary | ICD-10-CM | POA: Diagnosis not present

## 2017-09-30 DIAGNOSIS — M25551 Pain in right hip: Secondary | ICD-10-CM | POA: Diagnosis not present

## 2017-10-01 ENCOUNTER — Telehealth: Payer: Self-pay

## 2017-10-01 ENCOUNTER — Other Ambulatory Visit: Payer: Self-pay | Admitting: Family Medicine

## 2017-10-01 MED ORDER — BUPROPION HCL 100 MG PO TABS: 100.0000 mg | ORAL_TABLET | Freq: Two times a day (BID) | ORAL | 3 refills | Status: DC

## 2017-10-01 NOTE — Telephone Encounter (Signed)
Refill needed for Buproprion NOT Extended Release

## 2017-10-01 NOTE — Telephone Encounter (Signed)
Rx for Wellbutrin 100 mg bid sent (does not come in 150 mg tablets).

## 2017-10-04 ENCOUNTER — Ambulatory Visit (INDEPENDENT_AMBULATORY_CARE_PROVIDER_SITE_OTHER): Payer: Medicare HMO | Admitting: Family Medicine

## 2017-10-04 ENCOUNTER — Encounter: Payer: Self-pay | Admitting: Family Medicine

## 2017-10-04 VITALS — BP 112/78 | HR 68 | Ht 63.0 in | Wt 161.1 lb

## 2017-10-04 DIAGNOSIS — G4700 Insomnia, unspecified: Secondary | ICD-10-CM

## 2017-10-04 DIAGNOSIS — E538 Deficiency of other specified B group vitamins: Secondary | ICD-10-CM

## 2017-10-04 DIAGNOSIS — F3341 Major depressive disorder, recurrent, in partial remission: Secondary | ICD-10-CM | POA: Diagnosis not present

## 2017-10-04 DIAGNOSIS — Z9884 Bariatric surgery status: Secondary | ICD-10-CM

## 2017-10-04 DIAGNOSIS — I10 Essential (primary) hypertension: Secondary | ICD-10-CM | POA: Diagnosis not present

## 2017-10-04 DIAGNOSIS — K219 Gastro-esophageal reflux disease without esophagitis: Secondary | ICD-10-CM | POA: Diagnosis not present

## 2017-10-04 DIAGNOSIS — E039 Hypothyroidism, unspecified: Secondary | ICD-10-CM | POA: Diagnosis not present

## 2017-10-04 DIAGNOSIS — M159 Polyosteoarthritis, unspecified: Secondary | ICD-10-CM

## 2017-10-04 DIAGNOSIS — E559 Vitamin D deficiency, unspecified: Secondary | ICD-10-CM | POA: Diagnosis not present

## 2017-10-04 MED ORDER — BUPROPION HCL 100 MG PO TABS: 50.0000 mg | ORAL_TABLET | Freq: Two times a day (BID) | ORAL | 3 refills | Status: DC

## 2017-10-04 MED ORDER — VENLAFAXINE HCL 50 MG PO TABS: 50.0000 mg | ORAL_TABLET | Freq: Two times a day (BID) | ORAL | 2 refills | Status: DC

## 2017-10-04 NOTE — Progress Notes (Signed)
Date:  10/04/2017   Name:  Deborah Jimenez   DOB:  05/13/1956   MRN:  [id number]  PCP:  Adline Potter, MD    Chief Complaint: Hypertension and Eye Problem (much better will see plastic surgeon for droop eye and tear duct issues )   History of Present Illness:  This is a 62 y.o. female seen for one month f/u. Saw optho, told lid sag blocking tear ducts, eye gtts stopped, scheduled to see plastic surgeon. Depression marginally controlled on Effexor/Wellbutrin. GERD ok on daily PPI, intolerant d/c, H2B ineffective in past. Weaning off Elavil, down to 25 mg qhs, sleeping ok. OA ok with PT/Tylenol  Review of Systems:  Review of Systems  Constitutional: Negative for chills and fever.  Respiratory: Negative for cough and shortness of breath.   Cardiovascular: Negative for chest pain and leg swelling.  Genitourinary: Negative for difficulty urinating.  Neurological: Negative for syncope and light-headedness.    Patient Active Problem List   Diagnosis Date Noted  . GERD (gastroesophageal reflux disease) 09/09/2017  . History of bariatric surgery 09/09/2017  . Insomnia 08/12/2017  . Depression 08/12/2017  . Gout 08/11/2017  . Hypertension 08/11/2017  . Hypothyroidism 08/11/2017  . B12 deficiency 08/11/2017  . Vitamin D deficiency 08/11/2017  . History of polymyalgia rheumatica 08/11/2017  . Osteoarthritis 08/11/2017    Prior to Admission medications   Medication Sig Start Date End Date Taking? Authorizing Provider  acetaminophen (TYLENOL) 500 MG tablet Take 1,000 mg by mouth 2 (two) times daily.   Yes [provider]  allopurinol (ZYLOPRIM) 300 MG tablet Take 1 tablet by mouth daily. 07/15/17  Yes [provider]  BIOTIN PO Take 1 tablet by mouth daily.   Yes [provider]  buPROPion (WELLBUTRIN) 100 MG tablet Take 0.5 tablets (50 mg total) by mouth 2 (two) times daily. 10/04/17  Yes Tresha Muzio, Gwyndolyn Saxon, MD  Cholecalciferol (VITAMIN D3) 5000 units TABS  Take by mouth 2 (two) times a week.   Yes [provider]  EPINEPHrine 0.3 mg/0.3 mL IJ SOAJ injection Inject into the muscle as needed.   Yes [provider]  HYDROcodone-acetaminophen (NORCO/VICODIN) 5-325 MG tablet Take 1 tablet by mouth every 8 (eight) hours as needed. 05/14/17  Yes [provider]  levothyroxine (SYNTHROID, LEVOTHROID) 200 MCG tablet Take 1 tablet by mouth daily. 06/10/17  Yes [provider]  levothyroxine (SYNTHROID, LEVOTHROID) 25 MCG tablet Take 1 tablet by mouth daily. 07/15/17  Yes [provider]  lisinopril (PRINIVIL,ZESTRIL) 40 MG tablet Take 0.5 tablets by mouth daily. 07/23/17  Yes [provider]  Melatonin 3 MG TABS Take 1 tablet by mouth at bedtime.   Yes [provider]  Multiple Vitamins-Minerals (ALIVE WOMENS GUMMY PO) Take 4 capsules by mouth daily.   Yes [provider]  pantoprazole (PROTONIX) 40 MG tablet Take 1 tablet (40 mg total) by mouth daily. 09/03/17  Yes Denicia Pagliarulo, Gwyndolyn Saxon, MD  venlafaxine (EFFEXOR) 50 MG tablet Take 1 tablet (50 mg total) by mouth 2 (two) times daily. 10/04/17  Yes Shaton Lore, Gwyndolyn Saxon, MD  vitamin B-12 (CYANOCOBALAMIN) 500 MCG tablet Take 250 mcg by mouth 2 (two) times a week.    Yes [provider]    Allergies  Allergen Reactions  . Bee Pollen   . Latex   . Naprosyn [Naproxen] Diarrhea  . Penicillins     Past Surgical History:  Procedure Laterality Date  . CESAREAN SECTION    . DG GALL BLADDER    .  GASTRIC BYPASS    . TONSILLECTOMY AND ADENOIDECTOMY    . TOTAL HIP ARTHROPLASTY Left    x 2    Social History   Tobacco Use  . Smoking status: Former Smoker    Last attempt to quit: 01/24/2010    Years since quitting: 7.6  . Smokeless tobacco: Never Used  Substance Use Topics  . Alcohol use: Yes    Frequency: Never  . Drug use: No    Family History  Family history unknown: Yes    Medication list has been reviewed and updated.  Physical  Examination: BP 112/78   Pulse 68   Ht 5\' 3"  (1.6 m)   Wt 161 lb 1.6 oz (73.1 kg)   SpO2 99%   BMI 28.54 kg/m   Physical Exam  Constitutional: She appears well-developed and well-nourished.  Eyes:  L periorbital erythema much improved  Cardiovascular: Normal rate, regular rhythm and normal heart sounds.  Pulmonary/Chest: Effort normal and breath sounds normal.  Musculoskeletal:  Trace BLE edema  Neurological: She is alert.  Skin: Skin is warm and dry.  Psychiatric: She has a normal mood and affect. Her behavior is normal.  Nursing note and vitals reviewed.   Assessment and Plan:  1. Recurrent major depressive disorder, in partial remission (HCC) Increase Effexor to 50 mg bid, wean off Wellbutrin (unable to use XL formulations due to bariatric surgery)  2. Essential hypertension Well controlled on lisinopril  3. Gastroesophageal reflux disease, esophagitis presence not specified Well controlled on daily PPI, intolerant d/c, H2B ineffective in past  4. Hypothyroidism, unspecified type Well controlled on Synthroid  5. Osteoarthritis of multiple joints, unspecified osteoarthritis type Adequate control on PT/Tylenol  6. Insomnia, unspecified type Weaning off Elavil  7. B12 deficiency Well controlled on supplement  8. Vitamin D deficiency Well controlled on supplement  9. History of bariatric surgery Pt prefers to continue multiple supplements  Return in about 4 weeks (around 11/01/2017).  Satira Anis. [facility]  10/04/2017

## 2017-10-04 NOTE — Patient Instructions (Signed)
Increase venlafaxine to 50 mg twice daily (new prescription sent). Decrease bupropion to 50 mg (1/2 of 100 mg tablet) twice daily for two weeks then 50 mg (1/2 of 100 mg tablet) daily for two weeks then stop.

## 2017-10-06 ENCOUNTER — Telehealth: Payer: Self-pay

## 2017-10-06 NOTE — Telephone Encounter (Signed)
Called pt to sched AWV w/ [facility]. LVM requesting returned call.  

## 2017-10-11 ENCOUNTER — Ambulatory Visit: Payer: Medicare HMO | Admitting: Family Medicine

## 2017-10-11 DIAGNOSIS — R262 Difficulty in walking, not elsewhere classified: Secondary | ICD-10-CM | POA: Diagnosis not present

## 2017-10-11 DIAGNOSIS — M25561 Pain in right knee: Secondary | ICD-10-CM | POA: Diagnosis not present

## 2017-10-11 DIAGNOSIS — M25562 Pain in left knee: Secondary | ICD-10-CM | POA: Diagnosis not present

## 2017-10-11 DIAGNOSIS — M25551 Pain in right hip: Secondary | ICD-10-CM | POA: Diagnosis not present

## 2017-10-11 DIAGNOSIS — M25552 Pain in left hip: Secondary | ICD-10-CM | POA: Diagnosis not present

## 2017-10-14 DIAGNOSIS — M25552 Pain in left hip: Secondary | ICD-10-CM | POA: Diagnosis not present

## 2017-10-14 DIAGNOSIS — M25562 Pain in left knee: Secondary | ICD-10-CM | POA: Diagnosis not present

## 2017-10-14 DIAGNOSIS — M25561 Pain in right knee: Secondary | ICD-10-CM | POA: Diagnosis not present

## 2017-10-14 DIAGNOSIS — M25551 Pain in right hip: Secondary | ICD-10-CM | POA: Diagnosis not present

## 2017-10-14 DIAGNOSIS — R262 Difficulty in walking, not elsewhere classified: Secondary | ICD-10-CM | POA: Diagnosis not present

## 2017-10-18 DIAGNOSIS — M25551 Pain in right hip: Secondary | ICD-10-CM | POA: Diagnosis not present

## 2017-10-18 DIAGNOSIS — M25561 Pain in right knee: Secondary | ICD-10-CM | POA: Diagnosis not present

## 2017-10-18 DIAGNOSIS — M25552 Pain in left hip: Secondary | ICD-10-CM | POA: Diagnosis not present

## 2017-10-18 DIAGNOSIS — M25562 Pain in left knee: Secondary | ICD-10-CM | POA: Diagnosis not present

## 2017-10-18 DIAGNOSIS — R262 Difficulty in walking, not elsewhere classified: Secondary | ICD-10-CM | POA: Diagnosis not present

## 2017-10-21 DIAGNOSIS — M25552 Pain in left hip: Secondary | ICD-10-CM | POA: Diagnosis not present

## 2017-10-21 DIAGNOSIS — M25551 Pain in right hip: Secondary | ICD-10-CM | POA: Diagnosis not present

## 2017-10-21 DIAGNOSIS — M25561 Pain in right knee: Secondary | ICD-10-CM | POA: Diagnosis not present

## 2017-10-21 DIAGNOSIS — R262 Difficulty in walking, not elsewhere classified: Secondary | ICD-10-CM | POA: Diagnosis not present

## 2017-10-21 DIAGNOSIS — M25562 Pain in left knee: Secondary | ICD-10-CM | POA: Diagnosis not present

## 2017-10-25 DIAGNOSIS — M25561 Pain in right knee: Secondary | ICD-10-CM | POA: Diagnosis not present

## 2017-10-25 DIAGNOSIS — M25562 Pain in left knee: Secondary | ICD-10-CM | POA: Diagnosis not present

## 2017-10-25 DIAGNOSIS — M25551 Pain in right hip: Secondary | ICD-10-CM | POA: Diagnosis not present

## 2017-10-25 DIAGNOSIS — M25552 Pain in left hip: Secondary | ICD-10-CM | POA: Diagnosis not present

## 2017-10-25 DIAGNOSIS — R262 Difficulty in walking, not elsewhere classified: Secondary | ICD-10-CM | POA: Diagnosis not present

## 2017-10-31 ENCOUNTER — Encounter: Payer: Self-pay | Admitting: Family Medicine

## 2017-11-01 NOTE — Telephone Encounter (Signed)
Patient requesting refill. Please Advise.

## 2017-11-02 ENCOUNTER — Other Ambulatory Visit: Payer: Self-pay | Admitting: Family Medicine

## 2017-11-02 DIAGNOSIS — M25561 Pain in right knee: Secondary | ICD-10-CM | POA: Diagnosis not present

## 2017-11-02 DIAGNOSIS — M25562 Pain in left knee: Secondary | ICD-10-CM | POA: Diagnosis not present

## 2017-11-02 DIAGNOSIS — R262 Difficulty in walking, not elsewhere classified: Secondary | ICD-10-CM | POA: Diagnosis not present

## 2017-11-02 DIAGNOSIS — M25551 Pain in right hip: Secondary | ICD-10-CM | POA: Diagnosis not present

## 2017-11-02 DIAGNOSIS — M25552 Pain in left hip: Secondary | ICD-10-CM | POA: Diagnosis not present

## 2017-11-04 ENCOUNTER — Ambulatory Visit: Payer: Medicare HMO

## 2017-11-04 ENCOUNTER — Encounter: Payer: Self-pay | Admitting: Family Medicine

## 2017-11-04 ENCOUNTER — Ambulatory Visit (INDEPENDENT_AMBULATORY_CARE_PROVIDER_SITE_OTHER): Payer: Medicare HMO | Admitting: Family Medicine

## 2017-11-04 VITALS — BP 124/82 | Resp 16 | Ht 63.0 in | Wt 164.1 lb

## 2017-11-04 DIAGNOSIS — M1A0790 Idiopathic chronic gout, unspecified ankle and foot, without tophus (tophi): Secondary | ICD-10-CM

## 2017-11-04 DIAGNOSIS — Z9884 Bariatric surgery status: Secondary | ICD-10-CM

## 2017-11-04 DIAGNOSIS — E538 Deficiency of other specified B group vitamins: Secondary | ICD-10-CM

## 2017-11-04 DIAGNOSIS — M159 Polyosteoarthritis, unspecified: Secondary | ICD-10-CM | POA: Diagnosis not present

## 2017-11-04 DIAGNOSIS — K219 Gastro-esophageal reflux disease without esophagitis: Secondary | ICD-10-CM

## 2017-11-04 DIAGNOSIS — F3341 Major depressive disorder, recurrent, in partial remission: Secondary | ICD-10-CM

## 2017-11-04 DIAGNOSIS — I10 Essential (primary) hypertension: Secondary | ICD-10-CM

## 2017-11-04 DIAGNOSIS — E559 Vitamin D deficiency, unspecified: Secondary | ICD-10-CM

## 2017-11-04 DIAGNOSIS — E039 Hypothyroidism, unspecified: Secondary | ICD-10-CM

## 2017-11-04 DIAGNOSIS — G4700 Insomnia, unspecified: Secondary | ICD-10-CM

## 2017-11-04 MED ORDER — EPINEPHRINE 0.3 MG/0.3ML IJ SOAJ: 0.3000 mg | INTRAMUSCULAR | 2 refills | Status: DC | PRN

## 2017-11-04 MED ORDER — VENLAFAXINE HCL 75 MG PO TABS: 75.0000 mg | ORAL_TABLET | Freq: Two times a day (BID) | ORAL | 2 refills | Status: DC

## 2017-11-04 NOTE — Patient Instructions (Signed)
Increase Effexor to 75 mg twice daily. Stop B12 supplement. Decrease vit D supplement to once weekly. Stop biotin as can interfere with thyroid testing. We will recheck your vitamin and thyroid levels at your next visit.

## 2017-11-04 NOTE — Progress Notes (Signed)
Date:  11/04/2017   Name:  Deborah Jimenez   DOB:  1956-05-13   MRN:  [id number]  PCP:  Adline Potter, MD    Chief Complaint: Depression (F/U ) and Allergies (Need Epi pen)   History of Present Illness:  This is a 62 y.o. female seen for one month f/u. Feeling more depressed, thinks mostly situational. Taking last Wellbutrin dose today. Insomnia still a problem, lots of light and noise in bedroom, still taking Elavil prn. GERD sxs well controlled on daily PPI, OA sxs ok on Tylenol bid. Remains on B12 and vit D supplements twice weekly. Needs Epipen refill.  Review of Systems:  Review of Systems  Constitutional: Negative for chills and fever.  Respiratory: Negative for cough and shortness of breath.   Cardiovascular: Negative for chest pain and leg swelling.  Genitourinary: Negative for difficulty urinating.  Neurological: Negative for syncope and light-headedness.    Patient Active Problem List   Diagnosis Date Noted  . GERD (gastroesophageal reflux disease) 09/09/2017  . History of bariatric surgery 09/09/2017  . Insomnia 08/12/2017  . Depression 08/12/2017  . Gout 08/11/2017  . Hypertension 08/11/2017  . Hypothyroidism 08/11/2017  . B12 deficiency 08/11/2017  . Vitamin D deficiency 08/11/2017  . History of polymyalgia rheumatica 08/11/2017  . Osteoarthritis 08/11/2017    Prior to Admission medications   Medication Sig Start Date End Date Taking? Authorizing Provider  acetaminophen (TYLENOL) 500 MG tablet Take 1,000 mg by mouth 2 (two) times daily.   Yes [provider]  allopurinol (ZYLOPRIM) 300 MG tablet Take 1 tablet by mouth daily. 07/15/17  Yes [provider]  Cholecalciferol (VITAMIN D3) 5000 units TABS Take by mouth once a week.   Yes [provider]  EPINEPHrine 0.3 mg/0.3 mL IJ SOAJ injection Inject 0.3 mLs (0.3 mg total) into the muscle as needed. 11/04/17  Yes Jeaninne Lodico, Gwyndolyn Saxon, MD  HYDROcodone-acetaminophen (NORCO/VICODIN) 5-325 MG  tablet Take 1 tablet by mouth every 8 (eight) hours as needed. 05/14/17  Yes [provider]  levothyroxine (SYNTHROID, LEVOTHROID) 200 MCG tablet Take 1 tablet by mouth daily. 06/10/17  Yes [provider]  levothyroxine (SYNTHROID, LEVOTHROID) 25 MCG tablet Take 1 tablet by mouth daily. 07/15/17  Yes [provider]  lisinopril (PRINIVIL,ZESTRIL) 40 MG tablet Take 0.5 tablets by mouth daily. 07/23/17  Yes [provider]  Melatonin 3 MG TABS Take 1 tablet by mouth at bedtime.   Yes [provider]  Multiple Vitamins-Minerals (ALIVE WOMENS GUMMY PO) Take 4 capsules by mouth daily.   Yes [provider]  pantoprazole (PROTONIX) 40 MG tablet Take 1 tablet (40 mg total) by mouth daily. 09/03/17  Yes Janavia Rottman, Gwyndolyn Saxon, MD  venlafaxine (EFFEXOR) 75 MG tablet Take 1 tablet (75 mg total) by mouth 2 (two) times daily. 11/04/17  Yes Shirlyn Savin, Gwyndolyn Saxon, MD    Allergies  Allergen Reactions  . Bee Pollen   . Latex   . Naprosyn [Naproxen] Diarrhea  . Penicillins     Past Surgical History:  Procedure Laterality Date  . CESAREAN SECTION    . DG GALL BLADDER    . GASTRIC BYPASS    . TONSILLECTOMY AND ADENOIDECTOMY    . TOTAL HIP ARTHROPLASTY Left    x 2    Social History   Tobacco Use  . Smoking status: Former Smoker    Last attempt to quit: 01/24/2010    Years since quitting: 7.7  . Smokeless tobacco: Never Used  Substance Use Topics  . Alcohol  use: Yes    Frequency: Never  . Drug use: No    Family History  Family history unknown: Yes    Medication list has been reviewed and updated.  Physical Examination: BP 124/82   Resp 16   Ht 5\' 3"  (1.6 m)   Wt 164 lb 1.6 oz (74.4 kg)   BMI 29.07 kg/m   Physical Exam  Constitutional: She appears well-developed and well-nourished.  Cardiovascular: Normal rate, regular rhythm and normal heart sounds.  Pulmonary/Chest: Effort normal and breath sounds normal.  Musculoskeletal: She exhibits no  edema.  Neurological: She is alert.  Skin: Skin is warm and dry.  Psychiatric: Her behavior is normal.  Depressed affect  Nursing note and vitals reviewed.   Assessment and Plan:  1. Recurrent major depressive disorder, in partial remission (HCC) Increase Effexor to 75 mg bid (unable to use XL formulation due to bariatric surgery)  2. Essential hypertension Well controlled on lisinopril  3. Gastroesophageal reflux disease, esophagitis presence not specified Well controlled on daily Protonix, intolerant taper  4. Hypothyroidism, unspecified type TSH ok on high dose Synthroid but biotin may be interfering with testing, recommend d/c, recheck TSH next visit  5. Osteoarthritis of multiple joints, unspecified osteoarthritis type Adequate control with PT/Tylenol bid  6. Chronic idiopathic gout involving toe without tophus, unspecified laterality Well controlled on allopurinol  7. Insomnia, unspecified type May improve with increased Effexor, sleep hygiene discussed, avoid Elavil/Ambien, may use melatonin prn  8. Vitamin D deficiency Decrease supplement to weekly, recheck level next visit  9. B12 deficiency D/c low dose supplement, recheck level next visit  10. History of bariatric surgery Weight up 3#, exercise/diet discussed  Return in about 3 months (around 02/03/2018).  Satira Anis. [facility]  11/04/2017

## 2017-11-08 ENCOUNTER — Encounter: Payer: Self-pay | Admitting: Family Medicine

## 2017-11-09 NOTE — Telephone Encounter (Signed)
Patient mychart message. Please Advise.

## 2017-11-11 NOTE — Telephone Encounter (Signed)
Patient response. Thank you.

## 2017-11-18 DIAGNOSIS — H04551 Acquired stenosis of right nasolacrimal duct: Secondary | ICD-10-CM | POA: Diagnosis not present

## 2017-11-25 ENCOUNTER — Encounter: Payer: Self-pay | Admitting: Family Medicine

## 2017-12-05 ENCOUNTER — Encounter: Payer: Self-pay | Admitting: Family Medicine

## 2017-12-06 ENCOUNTER — Other Ambulatory Visit: Payer: Self-pay

## 2017-12-06 ENCOUNTER — Telehealth: Payer: Self-pay

## 2017-12-06 ENCOUNTER — Encounter: Payer: Self-pay | Admitting: Family Medicine

## 2017-12-06 DIAGNOSIS — G4700 Insomnia, unspecified: Secondary | ICD-10-CM

## 2017-12-06 NOTE — Telephone Encounter (Signed)
Putting in a referral to psychiatry for further Ambien use. Left pt a message on her voicemail to indicate this.

## 2017-12-06 NOTE — Telephone Encounter (Signed)
Left message stating that Dr Vicente Masson nor Dr Ronnald Ramp prescribes Ambien. I looked in Dr Chinita Greenland last office note and he "advised against Ambien" told her to "use Melatonin". Also called the pharmacy to see if he prescribed this, he had not "it was prescribed by a doctor in [place]"

## 2017-12-08 DIAGNOSIS — I10 Essential (primary) hypertension: Secondary | ICD-10-CM | POA: Diagnosis not present

## 2017-12-08 DIAGNOSIS — M109 Gout, unspecified: Secondary | ICD-10-CM | POA: Diagnosis not present

## 2017-12-08 DIAGNOSIS — G8929 Other chronic pain: Secondary | ICD-10-CM | POA: Diagnosis not present

## 2017-12-08 DIAGNOSIS — F329 Major depressive disorder, single episode, unspecified: Secondary | ICD-10-CM | POA: Diagnosis not present

## 2017-12-08 DIAGNOSIS — K219 Gastro-esophageal reflux disease without esophagitis: Secondary | ICD-10-CM | POA: Diagnosis not present

## 2017-12-09 ENCOUNTER — Telehealth: Payer: Self-pay

## 2017-12-09 ENCOUNTER — Encounter: Payer: Self-pay | Admitting: Family Medicine

## 2017-12-09 NOTE — Telephone Encounter (Signed)
Pt. Was called and given info on [facility]- phone number, address and walk in hours were left on voice mail

## 2017-12-24 DIAGNOSIS — F17210 Nicotine dependence, cigarettes, uncomplicated: Secondary | ICD-10-CM | POA: Diagnosis not present

## 2017-12-24 DIAGNOSIS — H04202 Unspecified epiphora, left lacrimal gland: Secondary | ICD-10-CM | POA: Diagnosis not present

## 2017-12-24 DIAGNOSIS — F329 Major depressive disorder, single episode, unspecified: Secondary | ICD-10-CM | POA: Diagnosis not present

## 2017-12-24 DIAGNOSIS — E039 Hypothyroidism, unspecified: Secondary | ICD-10-CM | POA: Diagnosis not present

## 2017-12-24 DIAGNOSIS — H04551 Acquired stenosis of right nasolacrimal duct: Secondary | ICD-10-CM | POA: Diagnosis not present

## 2017-12-24 DIAGNOSIS — H04552 Acquired stenosis of left nasolacrimal duct: Secondary | ICD-10-CM | POA: Diagnosis not present

## 2017-12-24 DIAGNOSIS — G4733 Obstructive sleep apnea (adult) (pediatric): Secondary | ICD-10-CM | POA: Diagnosis not present

## 2017-12-24 DIAGNOSIS — E669 Obesity, unspecified: Secondary | ICD-10-CM | POA: Diagnosis not present

## 2017-12-24 DIAGNOSIS — K219 Gastro-esophageal reflux disease without esophagitis: Secondary | ICD-10-CM | POA: Diagnosis not present

## 2017-12-24 DIAGNOSIS — H04223 Epiphora due to insufficient drainage, bilateral lacrimal glands: Secondary | ICD-10-CM | POA: Diagnosis not present

## 2017-12-24 DIAGNOSIS — F419 Anxiety disorder, unspecified: Secondary | ICD-10-CM | POA: Diagnosis not present

## 2017-12-24 HISTORY — PX: EYE SURGERY: SHX253

## 2018-01-03 ENCOUNTER — Other Ambulatory Visit: Payer: Self-pay

## 2018-01-03 ENCOUNTER — Ambulatory Visit (INDEPENDENT_AMBULATORY_CARE_PROVIDER_SITE_OTHER): Payer: Medicare HMO | Admitting: Psychiatry

## 2018-01-03 ENCOUNTER — Encounter: Payer: Self-pay | Admitting: Psychiatry

## 2018-01-03 VITALS — BP 146/79 | HR 80 | Temp 97.5°F | Wt 165.6 lb

## 2018-01-03 DIAGNOSIS — F331 Major depressive disorder, recurrent, moderate: Secondary | ICD-10-CM | POA: Diagnosis not present

## 2018-01-03 DIAGNOSIS — F5105 Insomnia due to other mental disorder: Secondary | ICD-10-CM | POA: Diagnosis not present

## 2018-01-03 DIAGNOSIS — F411 Generalized anxiety disorder: Secondary | ICD-10-CM | POA: Diagnosis not present

## 2018-01-03 MED ORDER — DULOXETINE HCL 30 MG PO CPEP: 30.0000 mg | ORAL_CAPSULE | Freq: Every day | ORAL | 0 refills | Status: DC

## 2018-01-03 MED ORDER — AMITRIPTYLINE HCL 25 MG PO TABS: 75.0000 mg | ORAL_TABLET | Freq: Every day | ORAL | 2 refills | Status: DC

## 2018-01-03 MED ORDER — ZOLPIDEM TARTRATE 10 MG PO TABS: 5.0000 mg | ORAL_TABLET | Freq: Every evening | ORAL | 2 refills | Status: DC | PRN

## 2018-01-03 NOTE — Patient Instructions (Signed)
Duloxetine delayed-release capsules What is this medicine? DULOXETINE (doo LOX e teen) is used to treat depression, anxiety, and different types of chronic pain. This medicine may be used for other purposes; ask your health care provider or pharmacist if you have questions. COMMON BRAND NAME(S): Cymbalta, [place]renka What should I tell my health care provider before I take this medicine? They need to know if you have any of these conditions: -bipolar disorder or a family history of bipolar disorder -glaucoma -kidney disease -liver disease -suicidal thoughts or a previous suicide attempt -taken medicines called MAOIs like Carbex, Eldepryl, Marplan, Nardil, and Parnate within 14 days -an unusual reaction to duloxetine, other medicines, foods, dyes, or preservatives -pregnant or trying to get pregnant -breast-feeding How should I use this medicine? Take this medicine by mouth with a glass of water. Follow the directions on the prescription label. Do not cut, crush or chew this medicine. You can take this medicine with or without food. Take your medicine at regular intervals. Do not take your medicine more often than directed. Do not stop taking this medicine suddenly except upon the advice of your doctor. Stopping this medicine too quickly may cause serious side effects or your condition may worsen. A special MedGuide will be given to you by the pharmacist with each prescription and refill. Be sure to read this information carefully each time. Talk to your pediatrician regarding the use of this medicine in children. While this drug may be prescribed for children as young as 7 years of age for selected conditions, precautions do apply. Overdosage: If you think you have taken too much of this medicine contact a poison control center or emergency room at once. NOTE: This medicine is only for you. Do not share this medicine with others. What if I miss a dose? If you miss a dose, take it as soon as you  can. If it is almost time for your next dose, take only that dose. Do not take double or extra doses. What may interact with this medicine? Do not take this medicine with any of the following medications: -desvenlafaxine -levomilnacipran -linezolid -MAOIs like Carbex, Eldepryl, Marplan, Nardil, and Parnate -methylene blue (injected into a vein) -milnacipran -thioridazine -venlafaxine This medicine may also interact with the following medications: -alcohol -amphetamines -aspirin and aspirin-like medicines -certain antibiotics like ciprofloxacin and enoxacin -certain medicines for blood pressure, heart disease, irregular heart beat -certain medicines for depression, anxiety, or psychotic disturbances -certain medicines for migraine headache like almotriptan, eletriptan, frovatriptan, naratriptan, rizatriptan, sumatriptan, zolmitriptan -certain medicines that treat or prevent blood clots like warfarin, enoxaparin, and dalteparin -cimetidine -fentanyl -lithium -NSAIDS, medicines for pain and inflammation, like ibuprofen or naproxen -phentermine -procarbazine -rasagiline -sibutramine -St. John's wort -theophylline -tramadol -tryptophan This list may not describe all possible interactions. Give your health care provider a list of all the medicines, herbs, non-prescription drugs, or dietary supplements you use. Also tell them if you smoke, drink alcohol, or use illegal drugs. Some items may interact with your medicine. What should I watch for while using this medicine? Tell your doctor if your symptoms do not get better or if they get worse. Visit your doctor or health care professional for regular checks on your progress. Because it may take several weeks to see the full effects of this medicine, it is important to continue your treatment as prescribed by your doctor. Patients and their families should watch out for new or worsening thoughts of suicide or depression. Also watch out for  sudden changes in   feelings such as feeling anxious, agitated, panicky, irritable, hostile, aggressive, impulsive, severely restless, overly excited and hyperactive, or not being able to sleep. If this happens, especially at the beginning of treatment or after a change in dose, call your health care professional. Dennis Bast may get drowsy or dizzy. Do not drive, use machinery, or do anything that needs mental alertness until you know how this medicine affects you. Do not stand or sit up quickly, especially if you are an older patient. This reduces the risk of dizzy or fainting spells. Alcohol may interfere with the effect of this medicine. Avoid alcoholic drinks. This medicine can cause an increase in blood pressure. This medicine can also cause a sudden drop in your blood pressure, which may make you feel faint and increase the chance of a fall. These effects are most common when you first start the medicine or when the dose is increased, or during use of other medicines that can cause a sudden drop in blood pressure. Check with your doctor for instructions on monitoring your blood pressure while taking this medicine. Your mouth may get dry. Chewing sugarless gum or sucking hard candy, and drinking plenty of water may help. Contact your doctor if the problem does not go away or is severe. What side effects may I notice from receiving this medicine? Side effects that you should report to your doctor or health care professional as soon as possible: -allergic reactions like skin rash, itching or hives, swelling of the face, lips, or tongue -anxious -breathing problems -confusion -changes in vision -chest pain -confusion -elevated mood, decreased need for sleep, racing thoughts, impulsive behavior -eye pain -fast, irregular heartbeat -feeling faint or lightheaded, falls -feeling agitated, angry, or irritable -hallucination, loss of contact with reality -high blood pressure -loss of balance or  coordination -palpitations -redness, blistering, peeling or loosening of the skin, including inside the mouth -restlessness, pacing, inability to keep still -seizures -stiff muscles -suicidal thoughts or other mood changes -trouble passing urine or change in the amount of urine -trouble sleeping -unusual bleeding or bruising -unusually weak or tired -vomiting -yellowing of the eyes or skin Side effects that usually do not require medical attention (report to your doctor or health care professional if they continue or are bothersome): -change in sex drive or performance -change in appetite or weight -constipation -dizziness -dry mouth -headache -increased sweating -nausea -tired This list may not describe all possible side effects. Call your doctor for medical advice about side effects. You may report side effects to FDA at 1-800-FDA-1088. Where should I keep my medicine? Keep out of the reach of children. Store at room temperature between 20 and 25 degrees C (68 to 77 degrees F). Throw away any unused medicine after the expiration date. NOTE: This sheet is a summary. It may not cover all possible information. If you have questions about this medicine, talk to your doctor, pharmacist, or health care provider.  2018 Elsevier/Gold Standard (2015-12-12 18:16:03) Zolpidem tablets What is this medicine? ZOLPIDEM (zole PI dem) is used to treat insomnia. This medicine helps you to fall asleep and sleep through the night. This medicine may be used for other purposes; ask your health care provider or pharmacist if you have questions. COMMON BRAND NAME(S): Ambien What should I tell my health care provider before I take this medicine? They need to know if you have any of these conditions: -depression -history of drug abuse or addiction -if you often drink alcohol -liver disease -lung or breathing disease -myasthenia gravis -  sleep apnea -suicidal thoughts, plans, or attempt; a previous  suicide attempt by you or a family member -an unusual or allergic reaction to zolpidem, other medicines, foods, dyes, or preservatives -pregnant or trying to get pregnant -breast-feeding How should I use this medicine? Take this medicine by mouth with a glass of water. Follow the directions on the prescription label. It is better to take this medicine on an empty stomach and only when you are ready for bed. Do not take your medicine more often than directed. If you have been taking this medicine for several weeks and suddenly stop taking it, you may get unpleasant withdrawal symptoms. Your doctor or health care professional may want to gradually reduce the dose. Do not stop taking this medicine on your own. Always follow your doctor or health care professional's advice. A special MedGuide will be given to you by the pharmacist with each prescription and refill. Be sure to read this information carefully each time. Talk to your pediatrician regarding the use of this medicine in children. Special care may be needed. Overdosage: If you think you have taken too much of this medicine contact a poison control center or emergency room at once. NOTE: This medicine is only for you. Do not share this medicine with others. What if I miss a dose? This does not apply. This medicine should only be taken immediately before going to sleep. Do not take double or extra doses. What may interact with this medicine? -alcohol -antihistamines for allergy, cough and cold -certain medicines for anxiety or sleep -certain medicines for depression, like amitriptyline, fluoxetine, sertraline -certain medicines for fungal infections like ketoconazole and itraconazole -certain medicines for seizures like phenobarbital, primidone -ciprofloxacin -dietary supplements for sleep, like valerian or kava kava -general anesthetics like halothane, isoflurane, methoxyflurane, propofol -local anesthetics like lidocaine, pramoxine,  tetracaine -medicines that relax muscles for surgery -narcotic medicines for pain -phenothiazines like chlorpromazine, mesoridazine, prochlorperazine, thioridazine -rifampin This list may not describe all possible interactions. Give your health care provider a list of all the medicines, herbs, non-prescription drugs, or dietary supplements you use. Also tell them if you smoke, drink alcohol, or use illegal drugs. Some items may interact with your medicine. What should I watch for while using this medicine? Visit your doctor or health care professional for regular checks on your progress. Keep a regular sleep schedule by going to bed at about the same time each night. Avoid caffeine-containing drinks in the evening hours. When sleep medicines are used every night for more than a few weeks, they may stop working. Talk to your doctor if you still have trouble sleeping. After taking this medicine for sleep, you may get up out of bed while not being fully awake and do an activity that you do not know you are doing. The next morning, you may have no memory of the event. Activities such as driving a car ("sleep-driving"), making and eating food, talking on the phone, sexual activity, and sleep-walking have been reported. Call your doctor right away if you find out you have done any of these activities. Do not take this medicine if you have used alcohol that evening or before bed or taken another medicine for sleep since your risk of doing these sleep-related activities will be increased. Wait for at least 8 hours after you take a dose before driving or doing other activities that require full mental alertness. Do not take this medicine unless you are able to stay in bed for a full night (7  to 8 hours) before you must be active again. You may have a decrease in mental alertness the day after use, even if you feel that you are fully awake. Tell your doctor if you will need to perform activities requiring full  alertness, such as driving, the next day. Do not stand or sit up quickly after taking this medicine, especially if you are an older patient. This reduces the risk of dizzy or fainting spells. If you or your family notice any changes in your behavior, such as new or worsening depression, thoughts of harming yourself, anxiety, other unusual or disturbing thoughts, or memory loss, call your doctor right away. After you stop taking this medicine, you may have trouble falling asleep. This is called rebound insomnia. This problem usually goes away on its own after 1 or 2 nights. What side effects may I notice from receiving this medicine? Side effects that you should report to your doctor or health care professional as soon as possible: -allergic reactions like skin rash, itching or hives, swelling of the face, lips, or tongue -breathing problems -changes in vision -confusion -depressed mood or other changes in moods or emotions -feeling faint or lightheaded, falls -hallucinations -loss of balance or coordination -loss of memory -numbness or tingling of the tongue -restlessness, excitability, or feelings of anxiety or agitation -signs and symptoms of liver injury like dark yellow or brown urine; general ill feeling or flu-like symptoms; light-colored stools; loss of appetite; nausea; right upper belly pain; unusually weak or tired; yellowing of the eyes or skin -suicidal thoughts -unusual activities while asleep like driving, eating, making phone calls, or sexual activity Side effects that usually do not require medical attention (report to your doctor or health care professional if they continue or are bothersome): -dizziness -drowsiness the day after you take this medicine -headache This list may not describe all possible side effects. Call your doctor for medical advice about side effects. You may report side effects to FDA at 1-800-FDA-1088. Where should I keep my medicine? Keep out of the  reach of children. This medicine can be abused. Keep your medicine in a safe place to protect it from theft. Do not share this medicine with anyone. Selling or giving away this medicine is dangerous and against the law. This medicine may cause accidental overdose and death if taken by other adults, children, or pets. Mix any unused medicine with a substance like cat litter or coffee grounds. Then throw the medicine away in a sealed container like a sealed bag or a coffee can with a lid. Do not use the medicine after the expiration date. Store at room temperature between 20 and 25 degrees C (68 and 77 degrees F). NOTE: This sheet is a summary. It may not cover all possible information. If you have questions about this medicine, talk to your doctor, pharmacist, or health care provider.  2018 Elsevier/Gold Standard (2015-10-16 14:38:20)
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
## 2018-01-11 ENCOUNTER — Telehealth: Payer: Self-pay | Admitting: Psychiatry

## 2018-01-11 MED ORDER — DULOXETINE HCL 30 MG PO CPEP: 30.0000 mg | ORAL_CAPSULE | Freq: Every day | ORAL | 0 refills | Status: DC

## 2018-01-11 NOTE — Telephone Encounter (Signed)
Sent Cymbalta to pharmacy for 30 days.pt has appointment to see me July 1 st week.

## 2018-01-13 ENCOUNTER — Telehealth: Payer: Self-pay

## 2018-01-13 NOTE — Telephone Encounter (Signed)
Patient called stating that at her last office visit if she requested a prescription would be called in to help with her taking the medication Cymbalta. Next appointment is scheduled for 01/24/18. Please review and advise.

## 2018-01-13 NOTE — Telephone Encounter (Signed)
Called and informed patient of message. She voiced understanding

## 2018-01-13 NOTE — Telephone Encounter (Signed)
She is on Elavil and Cymbalta now. More medications changes may be done when she comes in for appointment. But for now I would advice to give cymbalta more time .

## 2018-01-24 ENCOUNTER — Ambulatory Visit (INDEPENDENT_AMBULATORY_CARE_PROVIDER_SITE_OTHER): Payer: Medicare HMO | Admitting: Psychiatry

## 2018-01-24 ENCOUNTER — Encounter: Payer: Self-pay | Admitting: Psychiatry

## 2018-01-24 ENCOUNTER — Other Ambulatory Visit: Payer: Self-pay

## 2018-01-24 VITALS — BP 147/77 | HR 88 | Temp 97.7°F | Wt 166.2 lb

## 2018-01-24 DIAGNOSIS — F5105 Insomnia due to other mental disorder: Secondary | ICD-10-CM

## 2018-01-24 DIAGNOSIS — F331 Major depressive disorder, recurrent, moderate: Secondary | ICD-10-CM | POA: Diagnosis not present

## 2018-01-24 DIAGNOSIS — F411 Generalized anxiety disorder: Secondary | ICD-10-CM

## 2018-01-24 MED ORDER — ZOLPIDEM TARTRATE 10 MG PO TABS: 5.0000 mg | ORAL_TABLET | Freq: Every evening | ORAL | 2 refills | Status: DC | PRN

## 2018-01-24 MED ORDER — DULOXETINE HCL 20 MG PO CPEP: 40.0000 mg | ORAL_CAPSULE | Freq: Every day | ORAL | 2 refills | Status: DC

## 2018-01-24 NOTE — Progress Notes (Signed)
[facility] MD OP Progress Note  01/24/2018 3:22 PM Deborah Jimenez  MRN:  [id number]  Chief Complaint: ' I am here for follow up." Chief Complaint    Follow-up; Medication Refill     HPI: Deborah Jimenez is a 62 year old Caucasian female, married, on disability, lives in [place], has a history of depression, anxiety, chronic pain, history of bariatric surgery, hypothyroidism, vitamin B12, vitamin D deficiency, polymyalgia rheumatica, chronic pain, multiple hip replacement surgeries, recent eye surgery, presented to the clinic today for a follow-up visit.  Patient today reports she had a few days initially when she felt bad with regards to her anxiety symptoms while coming off of the Effexor.  She however reports she feels much better now.  She however is interested in increasing the Cymbalta slightly.  She denies any side effects to the medication.  She reports she is sleeping better.  She continues to use the Ambien as needed.  Patient denies any suicidality.  Patient denies any perceptual disturbances.  Patient reports after the eye surgery she spent some time with her daughter and that helped her a lot.  She reports she and her family took a vacation to the beach which went well.  She denies any other concerns today. Visit Diagnosis:    ICD-10-CM   1. MDD (major depressive disorder), recurrent episode, moderate (HCC) F33.1 DULoxetine (CYMBALTA) 20 MG capsule  2. GAD (generalized anxiety disorder) F41.1   3. Insomnia due to mental condition F51.05 zolpidem (AMBIEN) 10 MG tablet    Past Psychiatric History: Reviewed past psychiatric history from my progress note on 01/03/2018.  Past trials of Elavil, Wellbutrin, Ambien.  Past Medical History:  Past Medical History:  Diagnosis Date  . Depression   . GERD (gastroesophageal reflux disease)   . Gout   . Thyroid disease   . Weight loss     Past Surgical History:  Procedure Laterality Date  . CESAREAN SECTION    . DG GALL BLADDER    . EYE  SURGERY Bilateral   . GASTRIC BYPASS    . TONSILLECTOMY AND ADENOIDECTOMY    . TOTAL HIP ARTHROPLASTY Left    x 2    Family Psychiatric History: Reviewed family psychiatric history from my progress note on 01/03/2018  Family History:  Family History  Problem Relation Age of Onset  . Suicidality Other     Social History: Reviewed social history from my progress note on 01/03/2018. Social History   Socioeconomic History  . Marital status: Married    Spouse name: andy  . Number of children: 1  . Years of education: Not on file  . Highest education level: Master's degree (e.g., MA, MS, MEng, MEd, MSW, MBA)  Occupational History    Comment: disabled   Social Needs  . Financial resource strain: Not hard at all  . Food insecurity:    Worry: Often true    Inability: Often true  . Transportation needs:    Medical: Yes    Non-medical: Yes  Tobacco Use  . Smoking status: Former Smoker    Types: Cigarettes    Last attempt to quit: 01/24/2010    Years since quitting: 8.0  . Smokeless tobacco: Never Used  Substance and Sexual Activity  . Alcohol use: Not Currently    Frequency: Never  . Drug use: No  . Sexual activity: Not Currently  Lifestyle  . Physical activity:    Days per week: 7 days    Minutes per session: 40 min  . Stress: Very  much  Relationships  . Social connections:    Talks on phone: More than three times a week    Gets together: Never    Attends religious service: Never    Active member of club or organization: Yes    Attends meetings of clubs or organizations: More than 4 times per year    Relationship status: Married  Other Topics Concern  . Not on file  Social History Narrative  . Not on file    Allergies:  Allergies  Allergen Reactions  . Bee Pollen   . Latex   . Naprosyn [Naproxen] Diarrhea  . Penicillins     Metabolic Disorder Labs: No results found for: HGBA1C, MPG No results found for: PROLACTIN Lab Results  Component Value Date   CHOL  175 08/11/2017   TRIG 84 08/11/2017   HDL 72 08/11/2017   CHOLHDL 2.4 08/11/2017   VLDL 17 08/11/2017   LDLCALC 86 08/11/2017   Lab Results  Component Value Date   TSH 1.392 08/11/2017    Therapeutic Level Labs: No results found for: LITHIUM No results found for: VALPROATE No components found for:  CBMZ  Current Medications: Current Outpatient Medications  Medication Sig Dispense Refill  . allopurinol (ZYLOPRIM) 300 MG tablet Take 1 tablet by mouth daily.    Marland Kitchen amitriptyline (ELAVIL) 25 MG tablet Take 3 tablets (75 mg total) by mouth at bedtime. 90 tablet 2  . Cholecalciferol (VITAMIN D3) 5000 units TABS Take by mouth once a week.    . DULoxetine (CYMBALTA) 20 MG capsule Take 2 capsules (40 mg total) by mouth daily. 60 capsule 2  . EPINEPHrine 0.3 mg/0.3 mL IJ SOAJ injection Inject 0.3 mLs (0.3 mg total) into the muscle as needed. 1 Device 2  . levothyroxine (SYNTHROID, LEVOTHROID) 200 MCG tablet Take 1 tablet by mouth daily.    Marland Kitchen levothyroxine (SYNTHROID, LEVOTHROID) 25 MCG tablet Take 1 tablet by mouth daily.    Marland Kitchen lisinopril (PRINIVIL,ZESTRIL) 40 MG tablet Take 0.5 tablets by mouth daily.    . Melatonin 3 MG TABS Take 1 tablet by mouth at bedtime.    . meloxicam (MOBIC) 15 MG tablet Take by mouth.    . Multiple Vitamins-Minerals (ALIVE WOMENS GUMMY PO) Take 4 capsules by mouth daily.    . pantoprazole (PROTONIX) 40 MG tablet Take 1 tablet (40 mg total) by mouth daily. 90 tablet 3  . zolpidem (AMBIEN) 10 MG tablet Take 0.5-1 tablets (5-10 mg total) by mouth at bedtime as needed for sleep. 30 tablet 2   No current facility-administered medications for this visit.      Musculoskeletal: Strength & Muscle Tone: within normal limits Gait & Station: normal Patient leans: N/A  Psychiatric Specialty Exam: Review of Systems  Psychiatric/Behavioral: Positive for depression. The patient is nervous/anxious.   All other systems reviewed and are negative.   Blood pressure (!)  147/77, pulse 88, temperature 97.7 F (36.5 C), temperature source Oral, weight 166 lb 3.2 oz (75.4 kg).Body mass index is 29.44 kg/m.  General Appearance: Casual  Eye Contact:  Fair  Speech:  Normal Rate  Volume:  Normal  Mood:  Anxious and Depressed improving  Affect:  Congruent  Thought Process:  Goal Directed and Descriptions of Associations: Intact  Orientation:  Full (Time, Place, and Person)  Thought Content: Logical   Suicidal Thoughts:  No  Homicidal Thoughts:  No  Memory:  Immediate;   Fair Recent;   Fair Remote;   Fair  Judgement:  Fair  Insight:  Fair  Psychomotor Activity:  Normal  Concentration:  Concentration: Fair and Attention Span: Fair  Recall:  [employer] of Knowledge: Fair  Language: Fair  Akathisia:  No  Handed:  Right  AIMS (if indicated): na  Assets:  Communication Skills Desire for Improvement Social Support  ADL's:  Intact  Cognition: WNL  Sleep:  Fair   Screenings: PHQ2-9     Office Visit from 11/04/2017 in [facility] Office Visit from 08/11/2017 in [facility]  PHQ-2 Total Score  5  3  PHQ-9 Total Score  19  6       Assessment and Plan: Anistyn is a 62 year old Caucasian female who has a history of depression, anxiety, insomnia, chronic pain, hypothyroidism, polymyalgia rheumatica, vitamin D, vitamin B12 deficiency, hypertension, chronic pain, history of bariatric surgery, presented to the clinic today for a follow-up visit.  Patient is biologically predisposed given her family history of trauma.  Patient also has psychosocial stressors of multiple medical problems, physical disability, social isolation and so on.  Patient however reports her mood symptoms is getting better and she was able to spend more time with her family which helped.  Discussed plan as noted below.  Plan MDD Increase Cymbalta to 40 mg p.o. Daily. Continue amitriptyline 75 mg p.o. nightly Patient has been referred to Ms. Peacock for  psychotherapy. PHQ 9 - 7  For GAD Increase Cymbalta to 40 mg p.o. Daily Gad 7 = 8  For insomnia Ambien 5-10 mg p.o. nightly as needed  Follow-up in clinic in 6-8 weeks.    More than 50 % of the time was spent for psychoeducation and supportive psychotherapy and care coordination.  This note was generated in part or whole with voice recognition software. Voice recognition is usually quite accurate but there are transcription errors that can and very often do occur. I apologize for any typographical errors that were not detected and corrected.        Ursula Alert, MD 01/24/2018, 3:22 PM

## 2018-02-01 ENCOUNTER — Encounter: Payer: Self-pay | Admitting: Family Medicine

## 2018-02-03 ENCOUNTER — Ambulatory Visit (INDEPENDENT_AMBULATORY_CARE_PROVIDER_SITE_OTHER): Payer: Medicare HMO | Admitting: Family Medicine

## 2018-02-03 ENCOUNTER — Encounter: Payer: Self-pay | Admitting: Family Medicine

## 2018-02-03 VITALS — BP 112/74 | HR 66 | Resp 16 | Ht 63.0 in | Wt 164.0 lb

## 2018-02-03 DIAGNOSIS — K219 Gastro-esophageal reflux disease without esophagitis: Secondary | ICD-10-CM | POA: Diagnosis not present

## 2018-02-03 DIAGNOSIS — I10 Essential (primary) hypertension: Secondary | ICD-10-CM | POA: Diagnosis not present

## 2018-02-03 DIAGNOSIS — M159 Polyosteoarthritis, unspecified: Secondary | ICD-10-CM

## 2018-02-03 DIAGNOSIS — E039 Hypothyroidism, unspecified: Secondary | ICD-10-CM | POA: Diagnosis not present

## 2018-02-03 DIAGNOSIS — E790 Hyperuricemia without signs of inflammatory arthritis and tophaceous disease: Secondary | ICD-10-CM | POA: Diagnosis not present

## 2018-02-03 DIAGNOSIS — M1990 Unspecified osteoarthritis, unspecified site: Secondary | ICD-10-CM | POA: Diagnosis not present

## 2018-02-03 MED ORDER — LEVOTHYROXINE SODIUM 25 MCG PO TABS: 25.0000 ug | ORAL_TABLET | Freq: Every day | ORAL | 1 refills | Status: DC

## 2018-02-03 MED ORDER — LEVOTHYROXINE SODIUM 200 MCG PO TABS: 200.0000 ug | ORAL_TABLET | Freq: Every day | ORAL | 1 refills | Status: DC

## 2018-02-03 MED ORDER — MELOXICAM 15 MG PO TABS: 15.0000 mg | ORAL_TABLET | Freq: Every day | ORAL | 1 refills | Status: DC

## 2018-02-03 MED ORDER — PANTOPRAZOLE SODIUM 40 MG PO TBEC: 40.0000 mg | DELAYED_RELEASE_TABLET | Freq: Every day | ORAL | 1 refills | Status: DC

## 2018-02-03 MED ORDER — ALLOPURINOL 100 MG PO TABS: 100.0000 mg | ORAL_TABLET | Freq: Every day | ORAL | 1 refills | Status: DC

## 2018-02-03 MED ORDER — LISINOPRIL 20 MG PO TABS: 20.0000 mg | ORAL_TABLET | Freq: Every day | ORAL | 1 refills | Status: DC

## 2018-02-03 NOTE — Assessment & Plan Note (Signed)
Chronic stable Continue Mobic 15 mg prn.

## 2018-02-03 NOTE — Assessment & Plan Note (Signed)
Patient on allopurinol 300 mg Last uric acid 3.6 Will decrease allopurinol 100mg  daily and check uric acid in 3 mos.

## 2018-02-03 NOTE — Assessment & Plan Note (Signed)
Chronic. Stable Will continue lisinopril 20 MG daily. Will check renal panel in 3 mos.

## 2018-02-03 NOTE — Patient Instructions (Signed)

## 2018-02-03 NOTE — Assessment & Plan Note (Signed)
Chronic stable Continue pantoprazole 40 MG daily

## 2018-02-03 NOTE — Assessment & Plan Note (Signed)
Subjectively stable. Continue levothyroxine 225 mcgm (200 + 25) Will recheck TSH in 3 mos.

## 2018-02-03 NOTE — Progress Notes (Signed)
Name: Deborah Jimenez   MRN: [id number]    DOB: 1956-02-22   Date:02/03/2018       Progress Note  Subjective  Chief Complaint  Chief Complaint  Patient presents with  . Hypertension    rerfills    Patient presents to establish care with new physician. 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 Last uric acid 3.6. No nephropathy noted on labs.   Hypertension  This is a chronic problem. The current episode started more than 1 year ago. The problem is unchanged. The problem is controlled. Pertinent negatives include no anxiety, blurred vision, chest pain, headaches, malaise/fatigue, neck pain, orthopnea, palpitations, peripheral edema, PND, shortness of breath or sweats. There are no associated agents to hypertension. There are no known risk factors for coronary artery disease. Past treatments include ACE inhibitors. There are no compliance problems.  There is no history of angina, kidney disease, CAD/MI, CVA, heart failure, left ventricular hypertrophy, PVD or retinopathy. Identifiable causes of hypertension include a thyroid problem. There is no history of chronic renal disease, a hypertension causing med or renovascular disease.  Thyroid Problem  Presents for follow-up visit. Symptoms include hair loss. Patient reports no anxiety, cold intolerance, constipation, depressed mood, diaphoresis, diarrhea, dry skin, fatigue, heat intolerance, hoarse voice, leg swelling, menstrual problem, nail problem, palpitations, tremors, visual change, weight gain or weight loss. The symptoms have been stable. There is no history of heart failure.  Arthritis  Presents for follow-up (Bilateral hip arthritis/with history of polymyalgia rheumatica) visit. She complains of pain and stiffness. Symptom course: Fluctuates. Affected locations include the right hip and left hip. Pertinent negatives include no diarrhea, dysuria, fatigue, fever, rash or weight loss.  Gastroesophageal Reflux  She reports no  abdominal pain, no chest pain, no coughing, no heartburn, no hoarse voice, no nausea, no sore throat or no wheezing. This is a chronic problem. The current episode started more than 1 year ago. The problem has been unchanged (stable). The symptoms are aggravated by certain foods. Pertinent negatives include no fatigue, melena or weight loss. She has tried a PPI (pantoprzole 40) for the symptoms. The treatment provided moderate relief. Past procedures do not include a UGI.    Hypertension Chronic. Stable Will continue lisinopril 20 MG daily. Will check renal panel in 3 mos.  GERD (gastroesophageal reflux disease) Chronic stable Continue pantoprazole 40 MG daily  Hypothyroidism Subjectively stable. Continue levothyroxine 225 mcgm (200 + 25) Will recheck TSH in 3 mos.  Osteoarthritis Chronic stable Continue Mobic 15 mg prn.  Hyperuricemia Patient on allopurinol 300 mg Last uric acid 3.6 Will decrease allopurinol 100mg  daily and check uric acid in 3 mos.   Past Medical History:  Diagnosis Date  . Depression   . GERD (gastroesophageal reflux disease)   . Gout   . Thyroid disease   . Weight loss     Past Surgical History:  Procedure Laterality Date  . CESAREAN SECTION    . DG GALL BLADDER    . EYE SURGERY Bilateral   . EYE SURGERY Bilateral 12/24/2017  . GASTRIC BYPASS    . TONSILLECTOMY AND ADENOIDECTOMY    . TOTAL HIP ARTHROPLASTY Left    x 2    Family History  Problem Relation Age of Onset  . Suicidality Other     Social History   Socioeconomic History  . Marital status: Married    Spouse name: andy  . Number of children: 1  . Years of education: Not on file  .  Highest education level: Master's degree (e.g., MA, MS, MEng, MEd, MSW, MBA)  Occupational History    Comment: disabled   Social Needs  . Financial resource strain: Not hard at all  . Food insecurity:    Worry: Often true    Inability: Often true  . Transportation needs:    Medical: Yes     Non-medical: Yes  Tobacco Use  . Smoking status: Former Smoker    Types: Cigarettes    Last attempt to quit: 01/24/2010    Years since quitting: 8.0  . Smokeless tobacco: Never Used  Substance and Sexual Activity  . Alcohol use: Not Currently    Frequency: Never  . Drug use: No  . Sexual activity: Not Currently  Lifestyle  . Physical activity:    Days per week: 7 days    Minutes per session: 40 min  . Stress: Very much  Relationships  . Social connections:    Talks on phone: More than three times a week    Gets together: Never    Attends religious service: Never    Active member of club or organization: Yes    Attends meetings of clubs or organizations: More than 4 times per year    Relationship status: Married  . Intimate partner violence:    Fear of current or ex partner: No    Emotionally abused: No    Physically abused: No    Forced sexual activity: No  Other Topics Concern  . Not on file  Social History Narrative  . Not on file    Allergies  Allergen Reactions  . Bee Pollen   . Latex   . Naprosyn [Naproxen] Diarrhea  . Penicillins     Outpatient Medications Prior to Visit  Medication Sig Dispense Refill  . amitriptyline (ELAVIL) 25 MG tablet Take 3 tablets (75 mg total) by mouth at bedtime. 90 tablet 2  . Cholecalciferol (VITAMIN D3) 5000 units TABS Take by mouth once a week.    . DULoxetine (CYMBALTA) 20 MG capsule Take 2 capsules (40 mg total) by mouth daily. 60 capsule 2  . EPINEPHrine 0.3 mg/0.3 mL IJ SOAJ injection Inject 0.3 mLs (0.3 mg total) into the muscle as needed. 1 Device 2  . ferrous sulfate (IRON SUPPLEMENT) 325 (65 FE) MG tablet Take 325 mg by mouth daily with breakfast.    . Melatonin 3 MG TABS Take 1 tablet by mouth at bedtime.    . Multiple Vitamins-Minerals (ALIVE WOMENS GUMMY PO) Take 4 capsules by mouth daily.    Marland Kitchen zolpidem (AMBIEN) 10 MG tablet Take 0.5-1 tablets (5-10 mg total) by mouth at bedtime as needed for sleep. 30 tablet 2  .  allopurinol (ZYLOPRIM) 300 MG tablet Take 1 tablet by mouth daily.    Marland Kitchen levothyroxine (SYNTHROID, LEVOTHROID) 200 MCG tablet Take 1 tablet by mouth daily.    Marland Kitchen levothyroxine (SYNTHROID, LEVOTHROID) 25 MCG tablet Take 1 tablet by mouth daily.    Marland Kitchen lisinopril (PRINIVIL,ZESTRIL) 40 MG tablet Take 0.5 tablets by mouth daily.    . meloxicam (MOBIC) 15 MG tablet Take by mouth.    . pantoprazole (PROTONIX) 40 MG tablet Take 1 tablet (40 mg total) by mouth daily. 90 tablet 3   No facility-administered medications prior to visit.     Review of Systems  Constitutional: Negative for chills, diaphoresis, fatigue, fever, malaise/fatigue, weight gain and weight loss.  HENT: Negative for ear discharge, ear pain, hoarse voice and sore throat.   Eyes: Negative for  blurred vision.  Respiratory: Negative for cough, sputum production, shortness of breath and wheezing.   Cardiovascular: Negative for chest pain, palpitations, orthopnea, leg swelling and PND.  Gastrointestinal: Negative for abdominal pain, blood in stool, constipation, diarrhea, heartburn, melena and nausea.  Genitourinary: Negative for dysuria, frequency, hematuria, menstrual problem and urgency.  Musculoskeletal: Positive for arthritis and stiffness. Negative for back pain, joint pain, myalgias and neck pain.  Skin: Negative for rash.  Neurological: Negative for dizziness, tingling, tremors, sensory change, focal weakness and headaches.  Endo/Heme/Allergies: Negative for environmental allergies, cold intolerance, heat intolerance and polydipsia. Does not bruise/bleed easily.  Psychiatric/Behavioral: Negative for depression and suicidal ideas. The patient is not nervous/anxious and does not have insomnia.      Objective  Vitals:   02/03/18 0807  BP: 112/74  Pulse: 66  Resp: 16  SpO2: 99%  Weight: 164 lb (74.4 kg)  Height: 5\' 3"  (1.6 m)    Physical Exam  Constitutional: She is oriented to person, place, and time. She appears  well-developed and well-nourished.  HENT:  Head: Normocephalic.  Right Ear: External ear normal.  Left Ear: External ear normal.  Mouth/Throat: Oropharynx is clear and moist.  Eyes: Pupils are equal, round, and reactive to light. Conjunctivae and EOM are normal. Lids are everted and swept, no foreign bodies found. Left eye exhibits no hordeolum. No foreign body present in the left eye. Right conjunctiva is not injected. Left conjunctiva is not injected. No scleral icterus.  Neck: Normal range of motion. Neck supple. No JVD present. No tracheal deviation present. No thyromegaly present.  Cardiovascular: Normal rate, regular rhythm, normal heart sounds and intact distal pulses. Exam reveals no gallop and no friction rub.  No murmur heard. Pulmonary/Chest: Effort normal and breath sounds normal. No respiratory distress. She has no wheezes. She has no rales.  Abdominal: Soft. Bowel sounds are normal. She exhibits no mass. There is no hepatosplenomegaly. There is no tenderness. There is no rebound and no guarding.  Musculoskeletal: Normal range of motion. She exhibits no edema or tenderness.  Lymphadenopathy:    She has no cervical adenopathy.  Neurological: She is alert and oriented to person, place, and time. She has normal strength. No cranial nerve deficit.  Skin: Skin is warm. No rash noted.  Psychiatric: She has a normal mood and affect. Her mood appears not anxious. She does not exhibit a depressed mood.  Nursing note and vitals reviewed.     Assessment & Plan  Problem List Items Addressed This Visit      Cardiovascular and Mediastinum   Hypertension - Primary    Chronic. Stable Will continue lisinopril 20 MG daily. Will check renal panel in 3 mos.      Relevant Medications   lisinopril (PRINIVIL,ZESTRIL) 20 MG tablet     Digestive   GERD (gastroesophageal reflux disease)    Chronic stable Continue pantoprazole 40 MG daily      Relevant Medications   pantoprazole (PROTONIX)  40 MG tablet     Endocrine   Hypothyroidism    Subjectively stable. Continue levothyroxine 225 mcgm (200 + 25) Will recheck TSH in 3 mos.      Relevant Medications   levothyroxine (SYNTHROID, LEVOTHROID) 25 MCG tablet   levothyroxine (SYNTHROID, LEVOTHROID) 200 MCG tablet     Musculoskeletal and Integument   Osteoarthritis    Chronic stable Continue Mobic 15 mg prn.      Relevant Medications   meloxicam (MOBIC) 15 MG tablet   allopurinol (ZYLOPRIM) 100  MG tablet     Other   Hyperuricemia    Patient on allopurinol 300 mg Last uric acid 3.6 Will decrease allopurinol 100mg  daily and check uric acid in 3 mos.      Relevant Medications   allopurinol (ZYLOPRIM) 100 MG tablet    Other Visit Diagnoses    Arthritis       Relevant Medications   meloxicam (MOBIC) 15 MG tablet   allopurinol (ZYLOPRIM) 100 MG tablet      Meds ordered this encounter  Medications  . levothyroxine (SYNTHROID, LEVOTHROID) 25 MCG tablet    Sig: Take 1 tablet (25 mcg total) by mouth daily.    Dispense:  90 tablet    Refill:  1  . levothyroxine (SYNTHROID, LEVOTHROID) 200 MCG tablet    Sig: Take 1 tablet (200 mcg total) by mouth daily.    Dispense:  90 tablet    Refill:  1  . meloxicam (MOBIC) 15 MG tablet    Sig: Take 1 tablet (15 mg total) by mouth daily.    Dispense:  90 tablet    Refill:  1  . pantoprazole (PROTONIX) 40 MG tablet    Sig: Take 1 tablet (40 mg total) by mouth daily.    Dispense:  90 tablet    Refill:  1  . lisinopril (PRINIVIL,ZESTRIL) 20 MG tablet    Sig: Take 1 tablet (20 mg total) by mouth daily.    Dispense:  90 tablet    Refill:  1  . allopurinol (ZYLOPRIM) 100 MG tablet    Sig: Take 1 tablet (100 mg total) by mouth daily.    Dispense:  90 tablet    Refill:  1      Dr. Otilio Miu [facility]  02/03/18

## 2018-02-07 ENCOUNTER — Ambulatory Visit: Payer: Medicare HMO | Admitting: Licensed Clinical Social Worker

## 2018-03-14 ENCOUNTER — Encounter: Payer: Self-pay | Admitting: Psychiatry

## 2018-03-14 ENCOUNTER — Ambulatory Visit (INDEPENDENT_AMBULATORY_CARE_PROVIDER_SITE_OTHER): Payer: Medicare HMO | Admitting: Psychiatry

## 2018-03-14 ENCOUNTER — Other Ambulatory Visit: Payer: Self-pay

## 2018-03-14 VITALS — BP 143/91 | HR 67 | Temp 97.6°F | Wt 161.8 lb

## 2018-03-14 DIAGNOSIS — F411 Generalized anxiety disorder: Secondary | ICD-10-CM

## 2018-03-14 DIAGNOSIS — F331 Major depressive disorder, recurrent, moderate: Secondary | ICD-10-CM

## 2018-03-14 DIAGNOSIS — F5105 Insomnia due to other mental disorder: Secondary | ICD-10-CM | POA: Diagnosis not present

## 2018-03-14 MED ORDER — AMITRIPTYLINE HCL 25 MG PO TABS: 75.0000 mg | ORAL_TABLET | Freq: Every day | ORAL | 2 refills | Status: DC

## 2018-03-14 MED ORDER — DULOXETINE HCL 60 MG PO CPEP: 60.0000 mg | ORAL_CAPSULE | Freq: Every day | ORAL | 2 refills | Status: DC

## 2018-03-14 NOTE — Progress Notes (Signed)
[facility] OP Progress Note  03/14/2018 10:45 AM Deborah Jimenez  MRN:  [id number]  Chief Complaint: ' I am here for follow up." Chief Complaint    Follow-up; Medication Refill     HPI: Deborah Jimenez is a 62 year old Caucasian female, married, on disability, lives in [place], has a history of depression, anxiety, chronic pain, history of bariatric surgery, hypothyroidism, vitamin B12, vitamin D deficiency, polymyalgia rheumatica, chronic pain, multiple hip replacement surgeries, recent eye surgery, presented to the clinic today for a follow-up visit.  Patient today reports she is taking the Cymbalta as prescribed.  She is also on Elavil which she takes for pain.  She reports recently her stressors have been higher and that has been having an impact on her mood.  She rates her depression as 6 out of 10, 10 being the worst today.  Patient reports she is distressed by her inability to move around, mostly due to her pain, knee issues as well as limited transportation.  Her husband works all the time and he is unable to provide her transportation.  Patient reports she was hoping to use the apartment pool however that got vandalized and they are not going to repair it.  Patient hence has been having some trouble getting the physical activity that she requires.  She is unable to go to the community pools and make use of resources like [employer] sne[employer] due to her lack of transportation.  Discussed with patient about keeping her appointment with Ms. Royal Piedra, therapist who will be able to assist her by providing resources in the community.  Patient continues to sleep well on the Ambien.  Patient denies any suicidality, homicidality or perceptual disturbances. Visit Diagnosis:    ICD-10-CM   1. MDD (major depressive disorder), recurrent episode, moderate (HCC) F33.1   2. GAD (generalized anxiety disorder) F41.1   3. Insomnia due to mental condition F51.05     Past Psychiatric History: Reviewed past  psychiatric history from my progress note on 01/03/2018.  Past trials of Elavil, Wellbutrin, Ambien.  Past Medical History:  Past Medical History:  Diagnosis Date  . Depression   . GERD (gastroesophageal reflux disease)   . Gout   . Thyroid disease   . Weight loss     Past Surgical History:  Procedure Laterality Date  . CESAREAN SECTION    . DG GALL BLADDER    . EYE SURGERY Bilateral   . EYE SURGERY Bilateral 12/24/2017  . GASTRIC BYPASS    . TONSILLECTOMY AND ADENOIDECTOMY    . TOTAL HIP ARTHROPLASTY Left    x 2    Family Psychiatric History: I have reviewed family psychiatric history from my progress note on 01/03/2018.  Family History:  Family History  Problem Relation Age of Onset  . Suicidality Other     Social History: Have reviewed social history from my progress note on 01/03/2018 Social History   Socioeconomic History  . Marital status: Married    Spouse name: andy  . Number of children: 1  . Years of education: Not on file  . Highest education level: Master's degree (e.g., MA, MS, MEng, MEd, MSW, MBA)  Occupational History    Comment: disabled   Social Needs  . Financial resource strain: Not hard at all  . Food insecurity:    Worry: Often true    Inability: Often true  . Transportation needs:    Medical: Yes    Non-medical: Yes  Tobacco Use  . Smoking status: Former Smoker  Types: Cigarettes    Last attempt to quit: 01/24/2010    Years since quitting: 8.1  . Smokeless tobacco: Never Used  Substance and Sexual Activity  . Alcohol use: Not Currently    Frequency: Never  . Drug use: No  . Sexual activity: Not Currently  Lifestyle  . Physical activity:    Days per week: 7 days    Minutes per session: 40 min  . Stress: Very much  Relationships  . Social connections:    Talks on phone: More than three times a week    Gets together: Never    Attends religious service: Never    Active member of club or organization: Yes    Attends meetings of  clubs or organizations: More than 4 times per year    Relationship status: Married  Other Topics Concern  . Not on file  Social History Narrative  . Not on file    Allergies:  Allergies  Allergen Reactions  . Bee Pollen   . Latex   . Naprosyn [Naproxen] Diarrhea  . Penicillins     Metabolic Disorder Labs: No results found for: HGBA1C, MPG No results found for: PROLACTIN Lab Results  Component Value Date   CHOL 175 08/11/2017   TRIG 84 08/11/2017   HDL 72 08/11/2017   CHOLHDL 2.4 08/11/2017   VLDL 17 08/11/2017   LDLCALC 86 08/11/2017   Lab Results  Component Value Date   TSH 1.392 08/11/2017    Therapeutic Level Labs: No results found for: LITHIUM No results found for: VALPROATE No components found for:  CBMZ  Current Medications: Current Outpatient Medications  Medication Sig Dispense Refill  . allopurinol (ZYLOPRIM) 100 MG tablet Take 1 tablet (100 mg total) by mouth daily. 90 tablet 1  . amitriptyline (ELAVIL) 25 MG tablet Take 3 tablets (75 mg total) by mouth at bedtime. 90 tablet 2  . Cholecalciferol (VITAMIN D3) 5000 units TABS Take by mouth once a week.    Marland Kitchen EPINEPHrine 0.3 mg/0.3 mL IJ SOAJ injection Inject 0.3 mLs (0.3 mg total) into the muscle as needed. 1 Device 2  . ferrous sulfate (IRON SUPPLEMENT) 325 (65 FE) MG tablet Take 325 mg by mouth daily with breakfast.    . levothyroxine (SYNTHROID, LEVOTHROID) 200 MCG tablet Take 1 tablet (200 mcg total) by mouth daily. 90 tablet 1  . levothyroxine (SYNTHROID, LEVOTHROID) 25 MCG tablet Take 1 tablet (25 mcg total) by mouth daily. 90 tablet 1  . lisinopril (PRINIVIL,ZESTRIL) 20 MG tablet Take 1 tablet (20 mg total) by mouth daily. 90 tablet 1  . Melatonin 3 MG TABS Take 1 tablet by mouth at bedtime.    . meloxicam (MOBIC) 15 MG tablet Take 1 tablet (15 mg total) by mouth daily. 90 tablet 1  . Multiple Vitamins-Minerals (ALIVE WOMENS GUMMY PO) Take 4 capsules by mouth daily.    . pantoprazole (PROTONIX) 40 MG  tablet Take 1 tablet (40 mg total) by mouth daily. 90 tablet 1  . zolpidem (AMBIEN) 10 MG tablet Take 0.5-1 tablets (5-10 mg total) by mouth at bedtime as needed for sleep. 30 tablet 2  . DULoxetine (CYMBALTA) 60 MG capsule Take 1 capsule (60 mg total) by mouth daily. 30 capsule 2   No current facility-administered medications for this visit.      Musculoskeletal: Strength & Muscle Tone: within normal limits Gait & Station: normal Patient leans: N/A  Psychiatric Specialty Exam: Review of Systems  Musculoskeletal: Positive for myalgias.  Psychiatric/Behavioral: Positive for  depression. The patient is nervous/anxious.   All other systems reviewed and are negative.   Blood pressure (!) 143/91, pulse 67, temperature 97.6 F (36.4 C), temperature source Oral, weight 161 lb 12.8 oz (73.4 kg).Body mass index is 28.66 kg/m.  General Appearance: Casual  Eye Contact:  Fair  Speech:  Normal Rate  Volume:  Normal  Mood:  Depressed  Affect:  Congruent  Thought Process:  Goal Directed and Descriptions of Associations: Intact  Orientation:  Full (Time, Place, and Person)  Thought Content: Logical   Suicidal Thoughts:  No  Homicidal Thoughts:  No  Memory:  Immediate;   Fair Recent;   Fair Remote;   Fair  Judgement:  Fair  Insight:  Fair  Psychomotor Activity:  Normal  Concentration:  Concentration: Fair and Attention Span: Fair  Recall:  [employer] of Knowledge: Fair  Language: Fair  Akathisia:  No  Handed:  Right  AIMS (if indicated): na  Assets:  Communication Skills Desire for Improvement Housing Social Support  ADL's:  Intact  Cognition: WNL  Sleep:  Fair   Screenings: PHQ2-9     Office Visit from 02/03/2018 in [facility] Office Visit from 11/04/2017 in [facility] Office Visit from 08/11/2017 in [facility]  PHQ-2 Total Score  0  5  3  PHQ-9 Total Score  -  19  6       Assessment and Plan: Chey is a 62 year old Caucasian female who  has a history of depression, anxiety, insomnia, chronic pain, hypothyroidism, polymyalgia rheumatica, vitamin D, vitamin B12 deficiency, hypertension, chronic pain, history of bariatric surgery, presented to the clinic today for a follow-up visit.  Patient is biologically predisposed given her family history of mental illness and hx  of trauma.  Patient also has psychosocial stressors of multiple medical problems, physical disability, social isolation and so on.  Patient continues to have some depressive symptoms more so because of her medical problems and physical limitations.  Discussed making medication changes as noted below.  Plan MDD Increase Cymbalta to 60 mg p.o. daily. Continue Elavil 75 mg p.o. nightly-she takes it for pain. Patient will start psychotherapy with Ms. Peacock.  For GAD Increase Cymbalta to 60 mg p.o. daily.  For insomnia Ambien 5-10 mg p.o. nightly as needed  Follow-up in clinic in 6-8 weeks or sooner if needed.  More than 50 % of the time was spent for psychoeducation and supportive psychotherapy and care coordination.  This note was generated in part or whole with voice recognition software. Voice recognition is usually quite accurate but there are transcription errors that can and very often do occur. I apologize for any typographical errors that were not detected and corrected.      Ursula Alert, MD 03/14/2018, 10:45 AM

## 2018-03-29 ENCOUNTER — Ambulatory Visit: Payer: Medicare HMO | Admitting: Licensed Clinical Social Worker

## 2018-04-20 ENCOUNTER — Ambulatory Visit: Payer: Medicare HMO | Admitting: Licensed Clinical Social Worker

## 2018-04-20 DIAGNOSIS — F5105 Insomnia due to other mental disorder: Secondary | ICD-10-CM

## 2018-04-20 DIAGNOSIS — F411 Generalized anxiety disorder: Secondary | ICD-10-CM

## 2018-04-20 DIAGNOSIS — F331 Major depressive disorder, recurrent, moderate: Secondary | ICD-10-CM
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
## 2018-05-06 ENCOUNTER — Encounter: Payer: Self-pay | Admitting: Family Medicine

## 2018-05-06 ENCOUNTER — Ambulatory Visit (INDEPENDENT_AMBULATORY_CARE_PROVIDER_SITE_OTHER): Payer: Medicare HMO | Admitting: Family Medicine

## 2018-05-06 ENCOUNTER — Other Ambulatory Visit
Admission: RE | Admit: 2018-05-06 | Discharge: 2018-05-06 | Disposition: A | Discharge status: Home / Self Care | Payer: Medicare HMO | Source: Ambulatory Visit | Attending: Family Medicine | Admitting: Family Medicine

## 2018-05-06 VITALS — BP 130/80 | HR 72 | Ht 63.0 in | Wt 160.0 lb

## 2018-05-06 DIAGNOSIS — Z23 Encounter for immunization: Secondary | ICD-10-CM | POA: Diagnosis not present

## 2018-05-06 DIAGNOSIS — D649 Anemia, unspecified: Secondary | ICD-10-CM | POA: Insufficient documentation

## 2018-05-06 DIAGNOSIS — I10 Essential (primary) hypertension: Secondary | ICD-10-CM | POA: Insufficient documentation

## 2018-05-06 DIAGNOSIS — M1A0790 Idiopathic chronic gout, unspecified ankle and foot, without tophus (tophi): Secondary | ICD-10-CM | POA: Diagnosis not present

## 2018-05-06 DIAGNOSIS — E039 Hypothyroidism, unspecified: Secondary | ICD-10-CM | POA: Insufficient documentation

## 2018-05-06 DIAGNOSIS — E790 Hyperuricemia without signs of inflammatory arthritis and tophaceous disease: Secondary | ICD-10-CM | POA: Diagnosis not present

## 2018-05-06 DIAGNOSIS — K219 Gastro-esophageal reflux disease without esophagitis: Secondary | ICD-10-CM | POA: Diagnosis not present

## 2018-05-06 LAB — RENAL FUNCTION PANEL
Albumin: 4 g/dL (ref 3.5–5.0)
Anion gap: 10 (ref 5–15)
BUN: 24 mg/dL — AB (ref 8–23)
CO2: 23 mmol/L (ref 22–32)
Calcium: 9.2 mg/dL (ref 8.9–10.3)
Chloride: 108 mmol/L (ref 98–111)
Creatinine, Ser: 0.67 mg/dL (ref 0.44–1.00)
GFR calc Af Amer: >60 mL/min (ref >60)
GFR calc non Af Amer: >60 mL/min (ref >60)
GLUCOSE: 90 mg/dL (ref 70–99)
POTASSIUM: 3.7 mmol/L (ref 3.5–5.1)
Phosphorus: 3.7 mg/dL (ref 2.5–4.6)
Sodium: 141 mmol/L (ref 135–145)

## 2018-05-06 LAB — TSH: TSH: 0.488 u[IU]/mL (ref 0.350–4.500)

## 2018-05-06 LAB — URIC ACID: Uric Acid, Serum: 5.4 mg/dL (ref 2.5–7.1)

## 2018-05-06 LAB — HEMOGLOBIN: Hemoglobin: 11.8 g/dL — ABNORMAL LOW (ref 12.0–15.0)

## 2018-05-06 MED ORDER — ALLOPURINOL 100 MG PO TABS: 100.0000 mg | ORAL_TABLET | Freq: Every day | ORAL | 1 refills | Status: DC

## 2018-05-06 MED ORDER — LISINOPRIL 20 MG PO TABS: 20.0000 mg | ORAL_TABLET | Freq: Every day | ORAL | 1 refills | Status: DC

## 2018-05-06 MED ORDER — LEVOTHYROXINE SODIUM 25 MCG PO TABS: 25.0000 ug | ORAL_TABLET | Freq: Every day | ORAL | 1 refills | Status: DC

## 2018-05-06 MED ORDER — LEVOTHYROXINE SODIUM 200 MCG PO TABS: 200.0000 ug | ORAL_TABLET | Freq: Every day | ORAL | 1 refills | Status: DC

## 2018-05-06 MED ORDER — FERROUS SULFATE 325 (65 FE) MG PO TABS: 325.0000 mg | ORAL_TABLET | Freq: Every day | ORAL | 1 refills | Status: DC

## 2018-05-06 NOTE — Progress Notes (Signed)
Date:  05/06/2018   Name:  Deborah Jimenez   DOB:  05-28-1956   MRN:  [id number]   Chief Complaint: Hypothyroidism; Hypertension; Anemia; Flu Vaccine; and Gout Hypertension  This is a chronic problem. The current episode started more than 1 year ago. The problem is unchanged. The problem is controlled. Pertinent negatives include no anxiety, blurred vision, chest pain, headaches, malaise/fatigue, neck pain, orthopnea, palpitations, peripheral edema, PND, shortness of breath or sweats. There are no associated agents to hypertension. Past treatments include ACE inhibitors. The current treatment provides moderate improvement. There are no compliance problems.  There is no history of angina, kidney disease, CAD/MI, CVA, heart failure, left ventricular hypertrophy, PVD or retinopathy. Identifiable causes of hypertension include a thyroid problem. There is no history of chronic renal disease, a hypertension causing med or renovascular disease.  Anemia  Presents for follow-up visit. There has been no abdominal pain, anorexia, bruising/bleeding easily, confusion, fever, leg swelling, light-headedness, malaise/fatigue, pallor, palpitations, paresthesias, pica or weight loss. Signs of blood loss that are not present include hematemesis, hematochezia, melena, menorrhagia and vaginal bleeding. There is no history of chronic renal disease or heart failure. There are no compliance problems.   Thyroid Problem  Presents for follow-up visit. Patient reports no anxiety, cold intolerance, constipation, depressed mood, diaphoresis, diarrhea, dry skin, fatigue, hair loss, heat intolerance, hoarse voice, leg swelling, menstrual problem, nail problem, palpitations, tremors, visual change, weight gain or weight loss. The symptoms have been stable. There is no history of heart failure.     Review of Systems  Constitutional: Negative.  Negative for chills, diaphoresis, fatigue, fever, malaise/fatigue, unexpected  weight change, weight gain and weight loss.  HENT: Negative for congestion, ear discharge, ear pain, hoarse voice, rhinorrhea, sinus pressure, sneezing and sore throat.   Eyes: Negative for blurred vision, photophobia, pain, discharge, redness and itching.  Respiratory: Negative for cough, shortness of breath, wheezing and stridor.   Cardiovascular: Negative for chest pain, palpitations, orthopnea and PND.  Gastrointestinal: Negative for abdominal pain, anorexia, blood in stool, constipation, diarrhea, hematemesis, hematochezia, melena, nausea and vomiting.  Endocrine: Negative for cold intolerance, heat intolerance, polydipsia, polyphagia and polyuria.  Genitourinary: Negative for dysuria, flank pain, frequency, hematuria, menorrhagia, menstrual problem, pelvic pain, urgency, vaginal bleeding and vaginal discharge.  Musculoskeletal: Negative for arthralgias, back pain, myalgias and neck pain.  Skin: Negative for pallor and rash.  Allergic/Immunologic: Negative for environmental allergies and food allergies.  Neurological: Negative for dizziness, tremors, weakness, light-headedness, numbness, headaches and paresthesias.  Hematological: Negative for adenopathy. Does not bruise/bleed easily.  Psychiatric/Behavioral: Negative for confusion and dysphoric mood. The patient is not nervous/anxious.     Patient Active Problem List   Diagnosis Date Noted  . Anemia 05/06/2018  . Hyperuricemia 02/03/2018  . GERD (gastroesophageal reflux disease) 09/09/2017  . History of bariatric surgery 09/09/2017  . Insomnia 08/12/2017  . Depression 08/12/2017  . Gout 08/11/2017  . Hypertension 08/11/2017  . Hypothyroidism 08/11/2017  . B12 deficiency 08/11/2017  . Vitamin D deficiency 08/11/2017  . History of polymyalgia rheumatica 08/11/2017  . Osteoarthritis 08/11/2017    Allergies  Allergen Reactions  . Bee Pollen   . Latex   . Naprosyn [Naproxen] Diarrhea  . Penicillins     Past Surgical History:   Procedure Laterality Date  . CESAREAN SECTION    . DG GALL BLADDER    . EYE SURGERY Bilateral   . EYE SURGERY Bilateral 12/24/2017  . GASTRIC BYPASS    . TONSILLECTOMY AND  ADENOIDECTOMY    . TOTAL HIP ARTHROPLASTY Left    x 2    Social History   Tobacco Use  . Smoking status: Former Smoker    Types: Cigarettes    Last attempt to quit: 01/24/2010    Years since quitting: 8.2  . Smokeless tobacco: Never Used  Substance Use Topics  . Alcohol use: Not Currently    Frequency: Never  . Drug use: No     Medication list has been reviewed and updated.  No outpatient medications have been marked as taking for the 05/06/18 encounter (Office Visit) with Juline Patch, MD.    [facility] 2/9 Scores 05/06/2018 02/03/2018 11/04/2017 08/11/2017  PHQ - 2 Score 2 0 5 3  PHQ- 9 Score 2 - 19 6    Physical Exam  Constitutional: No distress.  HENT:  Head: Normocephalic and atraumatic.  Right Ear: Hearing, tympanic membrane, external ear and ear canal normal.  Left Ear: Hearing, tympanic membrane, external ear and ear canal normal.  Nose: Nose normal.  Mouth/Throat: Uvula is midline and oropharynx is clear and moist. Mucous membranes are pale, not dry and not cyanotic. No oropharyngeal exudate, posterior oropharyngeal edema or posterior oropharyngeal erythema.  Eyes: Pupils are equal, round, and reactive to light. Conjunctivae and EOM are normal. Right eye exhibits no discharge. Left eye exhibits no discharge.  Neck: Normal range of motion. Neck supple. Normal carotid pulses, no hepatojugular reflux and no JVD present. Carotid bruit is not present. No thyromegaly present.  Cardiovascular: Normal rate, regular rhythm, normal heart sounds and intact distal pulses. Exam reveals no gallop and no friction rub.  No murmur heard. Pulmonary/Chest: Effort normal and breath sounds normal. No stridor. She has no wheezes. She has no rales.  Abdominal: Soft. Normal aorta and bowel sounds are normal. She exhibits  no mass. There is no hepatosplenomegaly. There is no tenderness. There is no rigidity, no rebound, no guarding and no CVA tenderness.  Musculoskeletal: Normal range of motion. She exhibits no edema.  Lymphadenopathy:    She has no cervical adenopathy.  Neurological: She is alert. She has normal reflexes.  Skin: Skin is warm and dry. She is not diaphoretic.  Nursing note and vitals reviewed.   There were no vitals taken for this visit.  Assessment and Plan:  1. Essential hypertension Chronic Controlled Continue Lisinopril 20 mg daily - Renal Function Panel - lisinopril (PRINIVIL,ZESTRIL) 20 MG tablet; Take 1 tablet (20 mg total) by mouth daily.  Dispense: 90 tablet; Refill: 1  2. Gastroesophageal reflux disease, esophagitis presence not specified Chronic Controlled Continue Pantoprazole 40 mg daily.   3. Hypothyroidism, unspecified type Chronic Stable Check TSH and adjust Levothyroxin accordingly.  - TSH - levothyroxine (SYNTHROID, LEVOTHROID) 25 MCG tablet; Take 1 tablet (25 mcg total) by mouth daily.  Dispense: 90 tablet; Refill: 1 - levothyroxine (SYNTHROID, LEVOTHROID) 200 MCG tablet; Take 1 tablet (200 mcg total) by mouth daily.  Dispense: 90 tablet; Refill: 1  4. Chronic idiopathic gout involving toe without tophus, unspecified laterality Only one previous episode Presently on allopurinol. Check uric acid /will likely discontinue - Uric acid  5. Anemia, unspecified type Prior history of iron deficiency anemia recheck cbc - Hemoglobin  6. Hyperuricemia Discontinue allopurinol. Will resume if uric acid elevated. - allopurinol (ZYLOPRIM) 100 MG tablet; Take 1 tablet (100 mg total) by mouth daily.  Dispense: 90 tablet; Refill: 1  7. Flu vaccine need Discussed and administered. - Flu vaccine HIGH DOSE PF (Fluzone High dose)  Dr. [facility]  05/06/2018

## 2018-05-09 ENCOUNTER — Ambulatory Visit: Payer: Medicare HMO | Admitting: Licensed Clinical Social Worker

## 2018-05-09 ENCOUNTER — Ambulatory Visit: Payer: Medicare HMO | Admitting: Psychiatry

## 2018-05-09 ENCOUNTER — Other Ambulatory Visit: Payer: Self-pay

## 2018-05-09 ENCOUNTER — Encounter: Payer: Self-pay | Admitting: Psychiatry

## 2018-05-09 VITALS — BP 164/81 | HR 71 | Temp 98.2°F | Wt 163.6 lb

## 2018-05-09 DIAGNOSIS — F331 Major depressive disorder, recurrent, moderate: Secondary | ICD-10-CM | POA: Diagnosis not present

## 2018-05-09 DIAGNOSIS — F411 Generalized anxiety disorder: Secondary | ICD-10-CM

## 2018-05-09 DIAGNOSIS — F5105 Insomnia due to other mental disorder: Secondary | ICD-10-CM | POA: Diagnosis not present

## 2018-05-09 MED ORDER — AMITRIPTYLINE HCL 25 MG PO TABS: 25.0000 mg | ORAL_TABLET | Freq: Every day | ORAL | 0 refills | Status: DC

## 2018-05-09 MED ORDER — TRAZODONE HCL 50 MG PO TABS: 50.0000 mg | ORAL_TABLET | Freq: Every evening | ORAL | 0 refills | Status: DC | PRN

## 2018-05-09 NOTE — Progress Notes (Signed)
[facility] Progress Note  05/09/2018 12:43 PM Deborah Jimenez  MRN:  [id number]  Chief Complaint: ' I am here for follow up." Chief Complaint    Follow-up; Medication Refill     HPI: Deborah Jimenez is a 62 year old Caucasian female, married, on disability, lives in [place], has a history of depression, anxiety, chronic pain, history of bariatric surgery, hypothyroidism, vitamin B12, vitamin D deficiency, polymyalgia rheumatica, chronic pain, multiple hip replacement surgeries, recent eye surgery, presented to the clinic today for a follow-up visit.  Patient today reports she has been struggling with some depressive symptoms as well as sleep problems recently.  She reports she has been unable to exercise and since the weather is getting colder she has restrictions to do activities like she used to before.  She reports she was referred to the pace program by her therapist recently.  She is waiting to hear back from them.  She reports the Ambien and the Elavil does not help with her sleep problem.  She reports she is able to fall asleep but she wakes up at around 1 AM and is unable to fall back asleep.  She reports she has never tried trazodone.  Will add trazodone today.  Provided medication education to patient.  She will continue to follow-up with her therapist.  She denies any suicidality or homicidality.  Patient with elevated blood pressure today.  Discussed with her to follow-up with her PMD. Visit Diagnosis:    ICD-10-CM   1. MDD (major depressive disorder), recurrent episode, moderate (HCC) F33.1   2. GAD (generalized anxiety disorder) F41.1   3. Insomnia due to mental condition F51.05 traZODone (DESYREL) 50 MG tablet    Past Psychiatric History: I have reviewed past psychiatric history from my progress note on 01/03/2018.  Past trials of Elavil, Wellbutrin, Ambien.  Past Medical History:  Past Medical History:  Diagnosis Date  . Depression   . GERD (gastroesophageal reflux disease)    . Gout   . Thyroid disease   . Weight loss     Past Surgical History:  Procedure Laterality Date  . CESAREAN SECTION    . DG GALL BLADDER    . EYE SURGERY Bilateral   . EYE SURGERY Bilateral 12/24/2017  . GASTRIC BYPASS    . TONSILLECTOMY AND ADENOIDECTOMY    . TOTAL HIP ARTHROPLASTY Left    x 2    Family Psychiatric History: Reviewed family psychiatric history from my progress note on 01/03/2018  Family History:  Family History  Problem Relation Age of Onset  . Suicidality Other     Social History: Have reviewed social history from my progress note on 01/03/2018 Social History   Socioeconomic History  . Marital status: Married    Spouse name: andy  . Number of children: 1  . Years of education: Not on file  . Highest education level: Master's degree (e.g., MA, MS, MEng, MEd, MSW, MBA)  Occupational History    Comment: disabled   Social Needs  . Financial resource strain: Not hard at all  . Food insecurity:    Worry: Often true    Inability: Often true  . Transportation needs:    Medical: Yes    Non-medical: Yes  Tobacco Use  . Smoking status: Former Smoker    Types: Cigarettes    Last attempt to quit: 01/24/2010    Years since quitting: 8.2  . Smokeless tobacco: Never Used  Substance and Sexual Activity  . Alcohol use: Not Currently  Frequency: Never  . Drug use: No  . Sexual activity: Not Currently  Lifestyle  . Physical activity:    Days per week: 7 days    Minutes per session: 40 min  . Stress: Very much  Relationships  . Social connections:    Talks on phone: More than three times a week    Gets together: Never    Attends religious service: Never    Active member of club or organization: Yes    Attends meetings of clubs or organizations: More than 4 times per year    Relationship status: Married  Other Topics Concern  . Not on file  Social History Narrative  . Not on file    Allergies:  Allergies  Allergen Reactions  . Bee Pollen   .  Latex   . Naprosyn [Naproxen] Diarrhea  . Penicillins     Metabolic Disorder Labs: No results found for: HGBA1C, MPG No results found for: PROLACTIN Lab Results  Component Value Date   CHOL 175 08/11/2017   TRIG 84 08/11/2017   HDL 72 08/11/2017   CHOLHDL 2.4 08/11/2017   VLDL 17 08/11/2017   LDLCALC 86 08/11/2017   Lab Results  Component Value Date   TSH 0.488 05/06/2018   TSH 1.392 08/11/2017    Therapeutic Level Labs: No results found for: LITHIUM No results found for: VALPROATE No components found for:  CBMZ  Current Medications: Current Outpatient Medications  Medication Sig Dispense Refill  . allopurinol (ZYLOPRIM) 100 MG tablet Take 1 tablet (100 mg total) by mouth daily. 90 tablet 1  . amitriptyline (ELAVIL) 25 MG tablet Take 1 tablet (25 mg total) by mouth at bedtime. PT HAS SUPPLIES 90 tablet 0  . Cholecalciferol (VITAMIN D3) 5000 units TABS Take 1 tablet by mouth 2 (two) times daily.     . DULoxetine (CYMBALTA) 60 MG capsule Take 1 capsule (60 mg total) by mouth daily. (Patient taking differently: Take 60 mg by mouth daily. Dr Glennis Brink) 30 capsule 2  . EPINEPHrine 0.3 mg/0.3 mL IJ SOAJ injection Inject 0.3 mLs (0.3 mg total) into the muscle as needed. 1 Device 2  . ferrous sulfate (IRON SUPPLEMENT) 325 (65 FE) MG tablet Take 1 tablet (325 mg total) by mouth daily with breakfast. 90 tablet 1  . levothyroxine (SYNTHROID, LEVOTHROID) 200 MCG tablet Take 1 tablet (200 mcg total) by mouth daily. 90 tablet 1  . levothyroxine (SYNTHROID, LEVOTHROID) 25 MCG tablet Take 1 tablet (25 mcg total) by mouth daily. 90 tablet 1  . lisinopril (PRINIVIL,ZESTRIL) 20 MG tablet Take 1 tablet (20 mg total) by mouth daily. 90 tablet 1  . Melatonin 3 MG TABS Take 1 tablet by mouth at bedtime.    . meloxicam (MOBIC) 15 MG tablet Take 1 tablet (15 mg total) by mouth daily. 90 tablet 1  . Multiple Vitamins-Minerals (ALIVE WOMENS GUMMY PO) Take 4 capsules by mouth daily.    . pantoprazole  (PROTONIX) 40 MG tablet Take 1 tablet (40 mg total) by mouth daily. 90 tablet 1  . traZODone (DESYREL) 50 MG tablet Take 1-2 tablets (50-100 mg total) by mouth at bedtime as needed for sleep. 60 tablet 0   No current facility-administered medications for this visit.      Musculoskeletal: Strength & Muscle Tone: within normal limits Gait & Station: walks with walker Patient leans: N/A  Psychiatric Specialty Exam: Review of Systems  Psychiatric/Behavioral: Positive for depression. The patient is nervous/anxious and has insomnia.   All other systems  reviewed and are negative.   Blood pressure (!) 164/81, pulse 71, temperature 98.2 F (36.8 C), temperature source Oral, weight 163 lb 9.6 oz (74.2 kg).Body mass index is 28.98 kg/m.  General Appearance: Casual  Eye Contact:  Fair  Speech:  Normal Rate  Volume:  Normal  Mood:  Anxious and Dysphoric  Affect:  Congruent  Thought Process:  Goal Directed and Descriptions of Associations: Intact  Orientation:  Full (Time, Place, and Person)  Thought Content: Logical   Suicidal Thoughts:  No  Homicidal Thoughts:  No  Memory:  Immediate;   Fair Recent;   Fair Remote;   Fair  Judgement:  Fair  Insight:  Fair  Psychomotor Activity:  Normal  Concentration:  Concentration: Fair and Attention Span: Fair  Recall:  [employer] of Knowledge: Fair  Language: Fair  Akathisia:  No  Handed:  Right  AIMS (if indicated):NA  Assets:  Communication Skills Desire for Improvement Social Support  ADL's:  Intact  Cognition: WNL  Sleep:  Poor   Screenings: PHQ2-9     Office Visit from 05/06/2018 in [facility] Office Visit from 02/03/2018 in [facility] Office Visit from 11/04/2017 in [facility] Office Visit from 08/11/2017 in [facility]  PHQ-2 Total Score  2  0  5  3  PHQ-9 Total Score  2  -  19  6       Assessment and Plan: Deborah Jimenez is a 62 year old Caucasian female who has a history of depression,  anxiety, insomnia, chronic pain, hypothyroidism, polymyalgia rheumatica, vitamin D, vitamin B12 deficiency, hypertension, chronic pain, history of bariatric surgery, presented to the clinic today for a follow-up visit.  Patient is biologically predisposed given her family history of mental illness and history of trauma.  Patient also has psychosocial stressors of physical disability, social isolation, multiple medical problems.  Patient has started psychotherapy sessions with Ms. Royal Piedra.  She has been referred to the pace program and is awaiting to hear back from them.  We will continue to make medication changes since she continues to struggle with mood and sleep problems.  Plan MDD Continue Cymbalta 60 mg p.o. daily Reduce Elavil to 25 mg p.o. nightly-she takes it for pain Start trazodone 50-100 mg p.o. nightly as needed Continue CBT with Ms. Peacock  For GAD Cymbalta 60 mg p.o. daily  For insomnia Discontinue Ambien for lack of efficacy Reduce Elavil to 25 mg p.o. nightly Start trazodone 50-100 mg p.o. nightly as needed  For elevated blood pressure Patient to follow-up with her primary medical doctor.  Patient has been referred to pace program, patient awaiting to hear back from them.  Follow-up in clinic in 2-4 weeks or sooner if needed.  More than 50 % of the time was spent for psychoeducation and supportive psychotherapy and care coordination.  This note was generated in part or whole with voice recognition software. Voice recognition is usually quite accurate but there are transcription errors that can and very often do occur. I apologize for any typographical errors that were not detected and corrected.       Ursula Alert, MD 05/09/2018, 12:43 PM

## 2018-05-09 NOTE — Patient Instructions (Signed)
Start taking Trazodone 50 mg , you could increase to 75 mg and then to 100 mg gradually in a few days if you do not sleep on the lower dose. Start taking a lower dose of Elavil - 25 mg at bed time.  Trazodone tablets What is this medicine? TRAZODONE (TRAZ oh done) is used to treat depression. This medicine may be used for other purposes; ask your health care provider or pharmacist if you have questions. COMMON BRAND NAME(S): Desyrel What should I tell my health care provider before I take this medicine? They need to know if you have any of these conditions: -attempted suicide or thinking about it -bipolar disorder -bleeding problems -glaucoma -heart disease, or previous heart attack -irregular heart beat -kidney or liver disease -low levels of sodium in the blood -an unusual or allergic reaction to trazodone, other medicines, foods, dyes or preservatives -pregnant or trying to get pregnant -breast-feeding How should I use this medicine? Take this medicine by mouth with a glass of water. Follow the directions on the prescription label. Take this medicine shortly after a meal or a light snack. Take your medicine at regular intervals. Do not take your medicine more often than directed. Do not stop taking this medicine suddenly except upon the advice of your doctor. Stopping this medicine too quickly may cause serious side effects or your condition may worsen. A special MedGuide will be given to you by the pharmacist with each prescription and refill. Be sure to read this information carefully each time. Talk to your pediatrician regarding the use of this medicine in children. Special care may be needed. Overdosage: If you think you have taken too much of this medicine contact a poison control center or emergency room at once. NOTE: This medicine is only for you. Do not share this medicine with others. What if I miss a dose? If you miss a dose, take it as soon as you can. If it is almost time  for your next dose, take only that dose. Do not take double or extra doses. What may interact with this medicine? Do not take this medicine with any of the following medications: -certain medicines for fungal infections like fluconazole, itraconazole, ketoconazole, posaconazole, voriconazole -cisapride -dofetilide -dronedarone -linezolid -MAOIs like Carbex, Eldepryl, Marplan, Nardil, and Parnate -mesoridazine -methylene blue (injected into a vein) -pimozide -saquinavir -thioridazine -ziprasidone This medicine may also interact with the following medications: -alcohol -antiviral medicines for HIV or AIDS -aspirin and aspirin-like medicines -barbiturates like phenobarbital -certain medicines for blood pressure, heart disease, irregular heart beat -certain medicines for depression, anxiety, or psychotic disturbances -certain medicines for migraine headache like almotriptan, eletriptan, frovatriptan, naratriptan, rizatriptan, sumatriptan, zolmitriptan -certain medicines for seizures like carbamazepine and phenytoin -certain medicines for sleep -certain medicines that treat or prevent blood clots like dalteparin, enoxaparin, warfarin -digoxin -fentanyl -lithium -NSAIDS, medicines for pain and inflammation, like ibuprofen or naproxen -other medicines that prolong the QT interval (cause an abnormal heart rhythm) -rasagiline -supplements like St. John's wort, kava kava, valerian -tramadol -tryptophan This list may not describe all possible interactions. Give your health care provider a list of all the medicines, herbs, non-prescription drugs, or dietary supplements you use. Also tell them if you smoke, drink alcohol, or use illegal drugs. Some items may interact with your medicine. What should I watch for while using this medicine? Tell your doctor if your symptoms do not get better or if they get worse. Visit your doctor or health care professional for regular checks on your  progress.  Because it may take several weeks to see the full effects of this medicine, it is important to continue your treatment as prescribed by your doctor. Patients and their families should watch out for new or worsening thoughts of suicide or depression. Also watch out for sudden changes in feelings such as feeling anxious, agitated, panicky, irritable, hostile, aggressive, impulsive, severely restless, overly excited and hyperactive, or not being able to sleep. If this happens, especially at the beginning of treatment or after a change in dose, call your health care professional. Dennis Bast may get drowsy or dizzy. Do not drive, use machinery, or do anything that needs mental alertness until you know how this medicine affects you. Do not stand or sit up quickly, especially if you are an older patient. This reduces the risk of dizzy or fainting spells. Alcohol may interfere with the effect of this medicine. Avoid alcoholic drinks. This medicine may cause dry eyes and blurred vision. If you wear contact lenses you may feel some discomfort. Lubricating drops may help. See your eye doctor if the problem does not go away or is severe. Your mouth may get dry. Chewing sugarless gum, sucking hard candy and drinking plenty of water may help. Contact your doctor if the problem does not go away or is severe. What side effects may I notice from receiving this medicine? Side effects that you should report to your doctor or health care professional as soon as possible: -allergic reactions like skin rash, itching or hives, swelling of the face, lips, or tongue -elevated mood, decreased need for sleep, racing thoughts, impulsive behavior -confusion -fast, irregular heartbeat -feeling faint or lightheaded, falls -feeling agitated, angry, or irritable -loss of balance or coordination -painful or prolonged erections -restlessness, pacing, inability to keep still -suicidal thoughts or other mood changes -tremors -trouble  sleeping -seizures -unusual bleeding or bruising Side effects that usually do not require medical attention (report to your doctor or health care professional if they continue or are bothersome): -change in sex drive or performance -change in appetite or weight -constipation -headache -muscle aches or pains -nausea This list may not describe all possible side effects. Call your doctor for medical advice about side effects. You may report side effects to FDA at 1-800-FDA-1088. Where should I keep my medicine? Keep out of the reach of children. Store at room temperature between 15 and 30 degrees C (59 to 86 degrees F). Protect from light. Keep container tightly closed. Throw away any unused medicine after the expiration date. NOTE: This sheet is a summary. It may not cover all possible information. If you have questions about this medicine, talk to your doctor, pharmacist, or health care provider.  2018 Elsevier/Gold Standard (2015-12-12 16:57:05)

## 2018-05-12 ENCOUNTER — Telehealth: Payer: Self-pay

## 2018-05-12 MED ORDER — ZOLPIDEM TARTRATE 10 MG PO TABS: 5.0000 mg | ORAL_TABLET | Freq: Every evening | ORAL | 0 refills | Status: DC | PRN

## 2018-05-12 NOTE — Telephone Encounter (Signed)
Patient said that she can't take Trazadone 50mg  it makes her "nuts, jittery, nervous, itch,sweat."  Patient said that she wants to go back to Ambien where she was taking 1 tab to 1 1/2 at bedtime also her Amitriptyline 25mg . Next appointment is scheduled for 05-23-18. Please advise

## 2018-05-12 NOTE — Telephone Encounter (Signed)
I have sent Ambien 10 mg to pharmacy. 

## 2018-05-13 NOTE — Telephone Encounter (Signed)
Called patient and left voicemail to inform patient that Ambien had been sent to the pharmacy

## 2018-05-17 ENCOUNTER — Encounter: Payer: Self-pay | Admitting: Family Medicine

## 2018-05-19 ENCOUNTER — Other Ambulatory Visit: Payer: Self-pay

## 2018-05-19 DIAGNOSIS — M7522 Bicipital tendinitis, left shoulder: Secondary | ICD-10-CM

## 2018-05-23 ENCOUNTER — Ambulatory Visit: Payer: Medicare HMO | Admitting: Psychiatry

## 2018-05-24 DIAGNOSIS — S46001A Unspecified injury of muscle(s) and tendon(s) of the rotator cuff of right shoulder, initial encounter: Secondary | ICD-10-CM | POA: Diagnosis not present

## 2018-05-24 DIAGNOSIS — M25311 Other instability, right shoulder: Secondary | ICD-10-CM | POA: Diagnosis not present

## 2018-05-25 ENCOUNTER — Other Ambulatory Visit: Payer: Self-pay | Admitting: Orthopedic Surgery

## 2018-05-25 DIAGNOSIS — M25311 Other instability, right shoulder: Secondary | ICD-10-CM

## 2018-05-25 DIAGNOSIS — S46001A Unspecified injury of muscle(s) and tendon(s) of the rotator cuff of right shoulder, initial encounter: Secondary | ICD-10-CM

## 2018-06-13 ENCOUNTER — Ambulatory Visit
Admission: RE | Admit: 2018-06-13 | Discharge: 2018-06-13 | Disposition: A | Discharge status: Home / Self Care | Payer: Medicare HMO | Source: Ambulatory Visit | Attending: Orthopedic Surgery | Admitting: Orthopedic Surgery

## 2018-06-13 DIAGNOSIS — S46001A Unspecified injury of muscle(s) and tendon(s) of the rotator cuff of right shoulder, initial encounter: Secondary | ICD-10-CM

## 2018-06-13 DIAGNOSIS — M75121 Complete rotator cuff tear or rupture of right shoulder, not specified as traumatic: Secondary | ICD-10-CM | POA: Diagnosis not present

## 2018-06-13 DIAGNOSIS — M25411 Effusion, right shoulder: Secondary | ICD-10-CM | POA: Diagnosis not present

## 2018-06-13 DIAGNOSIS — M12811 Other specific arthropathies, not elsewhere classified, right shoulder: Secondary | ICD-10-CM | POA: Insufficient documentation

## 2018-06-13 DIAGNOSIS — M25311 Other instability, right shoulder: Secondary | ICD-10-CM

## 2018-06-27 ENCOUNTER — Other Ambulatory Visit: Payer: Self-pay | Admitting: Psychiatry

## 2018-07-05 DIAGNOSIS — S46001A Unspecified injury of muscle(s) and tendon(s) of the rotator cuff of right shoulder, initial encounter: Secondary | ICD-10-CM | POA: Diagnosis not present

## 2018-07-05 DIAGNOSIS — M25311 Other instability, right shoulder: Secondary | ICD-10-CM | POA: Diagnosis not present

## 2018-07-11 ENCOUNTER — Other Ambulatory Visit: Payer: Self-pay

## 2018-07-11 ENCOUNTER — Encounter
Admission: RE | Admit: 2018-07-11 | Discharge: 2018-07-11 | Disposition: A | Discharge status: Home / Self Care | Payer: Medicare HMO | Source: Ambulatory Visit | Attending: Orthopedic Surgery | Admitting: Orthopedic Surgery

## 2018-07-11 ENCOUNTER — Ambulatory Visit: Payer: Medicare HMO | Admitting: Psychiatry

## 2018-07-11 ENCOUNTER — Encounter: Payer: Self-pay | Admitting: Psychiatry

## 2018-07-11 VITALS — BP 167/79 | HR 75 | Temp 97.5°F | Wt 158.6 lb

## 2018-07-11 DIAGNOSIS — I10 Essential (primary) hypertension: Secondary | ICD-10-CM | POA: Diagnosis not present

## 2018-07-11 DIAGNOSIS — F411 Generalized anxiety disorder: Secondary | ICD-10-CM | POA: Diagnosis not present

## 2018-07-11 DIAGNOSIS — F331 Major depressive disorder, recurrent, moderate: Secondary | ICD-10-CM

## 2018-07-11 DIAGNOSIS — F5105 Insomnia due to other mental disorder: Secondary | ICD-10-CM

## 2018-07-11 DIAGNOSIS — Z01812 Encounter for preprocedural laboratory examination: Secondary | ICD-10-CM | POA: Diagnosis not present

## 2018-07-11 HISTORY — DX: Polymyalgia rheumatica: M35.3

## 2018-07-11 HISTORY — DX: Anemia, unspecified: D64.9

## 2018-07-11 HISTORY — DX: Essential (primary) hypertension: I10

## 2018-07-11 HISTORY — DX: Hypothyroidism, unspecified: E03.9

## 2018-07-11 HISTORY — DX: Dyspnea, unspecified: R06.00

## 2018-07-11 HISTORY — DX: Insomnia, unspecified: G47.00

## 2018-07-11 HISTORY — DX: Malignant (primary) neoplasm, unspecified: C80.1

## 2018-07-11 HISTORY — DX: Chronic kidney disease, unspecified: N18.9

## 2018-07-11 LAB — CBC
HCT: 37.3 % (ref 36.0–46.0)
HEMOGLOBIN: 12.1 g/dL (ref 12.0–15.0)
MCH: 31.9 pg (ref 26.0–34.0)
MCHC: 32.4 g/dL (ref 30.0–36.0)
MCV: 98.4 fL (ref 80.0–100.0)
NRBC: 0 % (ref 0.0–0.2)
Platelets: 221 10*3/uL (ref 150–400)
RBC: 3.79 MIL/uL — AB (ref 3.87–5.11)
RDW: 12.5 % (ref 11.5–15.5)
WBC: 7.1 10*3/uL (ref 4.0–10.5)

## 2018-07-11 LAB — BASIC METABOLIC PANEL
ANION GAP: 8 (ref 5–15)
BUN: 25 mg/dL — ABNORMAL HIGH (ref 8–23)
CO2: 23 mmol/L (ref 22–32)
Calcium: 9.3 mg/dL (ref 8.9–10.3)
Chloride: 107 mmol/L (ref 98–111)
Creatinine, Ser: 0.63 mg/dL (ref 0.44–1.00)
GFR calc Af Amer: >60 mL/min (ref >60)
GFR calc non Af Amer: >60 mL/min (ref >60)
Glucose, Bld: 88 mg/dL (ref 70–99)
POTASSIUM: 3.9 mmol/L (ref 3.5–5.1)
Sodium: 138 mmol/L (ref 135–145)

## 2018-07-11 MED ORDER — AMITRIPTYLINE HCL 25 MG PO TABS: 25.0000 mg | ORAL_TABLET | Freq: Every day | ORAL | 1 refills | Status: DC

## 2018-07-11 MED ORDER — DULOXETINE HCL 60 MG PO CPEP: 60.0000 mg | ORAL_CAPSULE | Freq: Every day | ORAL | 1 refills | Status: DC

## 2018-07-11 MED ORDER — CEFAZOLIN SODIUM-DEXTROSE 2-4 GM/100ML-% IV SOLN: 2.0000 g | Freq: Once | INTRAVENOUS | Status: DC

## 2018-07-11 MED ORDER — DULOXETINE HCL 30 MG PO CPEP: 30.0000 mg | ORAL_CAPSULE | Freq: Every day | ORAL | 1 refills | Status: DC
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
## 2018-07-11 NOTE — Patient Instructions (Signed)
Your procedure is scheduled on: Monday, July 18, 2018  Report to [facility]     DO NOT STOP ON THE FIRST FLOOR TO REGISTER  To find out your arrival time please call [phone] between 1PM - 3PM on Friday, July 15, 2018   Remember: Instructions that are not followed completely may result in serious medical risk,  up to and including death, or upon the discretion of your surgeon and anesthesiologist your  surgery may need to be rescheduled.     _X__ 1. Do not eat food after midnight the night before your procedure.                 No gum chewing or hard candies.                    ABSOLUTELY NOTHING SOLID IN YOUR MOUTH AFTER MIDNIGHT.                  You may drink clear liquids up to 2 hours before you are scheduled to arrive for your surgery-                  DO not drink clear liquids within 2 hours of the start of your surgery.                  Clear Liquids include:  water, apple juice without pulp, clear carbohydrate                 drink such as Clearfast of Gatorade, Black Coffee or Tea (Do not add                 anything to coffee or tea). NO BROTH OR JELLO / NO ADDED DAIRY PRODUCTS   __X__2.  On the morning of surgery brush your teeth with toothpaste and water,                   You may rinse your mouth with mouthwash if you wish.                         Do not swallow any toothpaste of mouthwash.     _X__ 3.  No Alcohol for 24 hours before or after surgery.   _X__ 4.  Do Not Smoke or use e-cigarettes For 24 Hours Prior to Your Surgery.                 Do not use any chewable tobacco products for at least 6 hours prior to                 surgery.  ____  5.  Bring all medications with you on the day of surgery if instructed.   _X___  6.  Notify your doctor if there is any change in your medical condition      (cold, fever, infections).     Do not wear jewelry, make-up, hairpins, clips or nail polish. Do not  wear lotions, powders, or perfumes. You may NOT wear deodorant ON RIGHT SIDE. Do not shave 48 hours prior to surgery. Men may shave face and neck. Do not bring valuables to the hospital.    [facility] is not responsible for any belongings or valuables.  Contacts, dentures or bridgework may not be worn into surgery. Leave your suitcase in the car. After surgery it may be brought to your room. For patients  admitted to the hospital, discharge time is determined by your treatment team.   Patients discharged the day of surgery will not be allowed to drive home.   Please read over the following fact sheets that you were given:   PREPARING FOR SURGERY    _X___ Take these medicines the morning of surgery with A SIP OF WATER:    1. VITAMIN D  2. CYMBALTA  3. MAGNESIUM  4. PROTONIX  5.  6.  ____ Fleet Enema (as directed)   __X__ Use CHG Soap as directed  _X___ Stop ALL ASPIRIN PRODUCTS AS OF NOW  _X___ Stop Anti-inflammatories AS OF TODAY                   THIS INCLUDES MOBIC               YOU MAY TAKE TYLENOL AT ANY TIME PRIOR TO SURGERY   __X__ Stop supplements until after surgery.               THIS INCLUDES CALCIUM PLUS D / IRON    ____ Bring C-Pap to the hospital.   YOU MAY CONTINUE TAKING MELATONIN BUT DO NOT TAKE THE NIGHT     BEFORE SURGERY  CONTINUE TAKING LISINOPRIL, BUT DO NOT TAKE ON THE DAY OF SURGERY  YOU MAY CONTINUE TAKING MULTIVITS BUT DO NOT HAVE [place] ON       THE DAY OF SURGERY.  CONTINUE LEVOTHYROXINE / AMBIEN AND ELAVIL AS USUAL.    WEAR A LARGE BUTTON UP SHIRT OR A VERY LARGE LOOSE SLIP ON SHIRT.

## 2018-07-18 ENCOUNTER — Ambulatory Visit
Admission: RE | Admit: 2018-07-18 | Discharge: 2018-07-18 | Disposition: A | Discharge status: Home / Self Care | Payer: Medicare HMO | Attending: Orthopedic Surgery | Admitting: Orthopedic Surgery

## 2018-07-18 ENCOUNTER — Encounter
Admission: RE | Disposition: A | Discharge status: Home / Self Care | Payer: Self-pay | Source: Home / Self Care | Attending: Orthopedic Surgery

## 2018-07-18 ENCOUNTER — Ambulatory Visit: Payer: Medicare HMO | Admitting: Anesthesiology

## 2018-07-18 ENCOUNTER — Other Ambulatory Visit: Payer: Self-pay

## 2018-07-18 ENCOUNTER — Ambulatory Visit: Payer: Medicare HMO

## 2018-07-18 DIAGNOSIS — X58XXXA Exposure to other specified factors, initial encounter: Secondary | ICD-10-CM | POA: Diagnosis not present

## 2018-07-18 DIAGNOSIS — M353 Polymyalgia rheumatica: Secondary | ICD-10-CM | POA: Insufficient documentation

## 2018-07-18 DIAGNOSIS — S46211A Strain of muscle, fascia and tendon of other parts of biceps, right arm, initial encounter: Secondary | ICD-10-CM | POA: Insufficient documentation

## 2018-07-18 DIAGNOSIS — E039 Hypothyroidism, unspecified: Secondary | ICD-10-CM | POA: Insufficient documentation

## 2018-07-18 DIAGNOSIS — E538 Deficiency of other specified B group vitamins: Secondary | ICD-10-CM | POA: Diagnosis not present

## 2018-07-18 DIAGNOSIS — M75121 Complete rotator cuff tear or rupture of right shoulder, not specified as traumatic: Secondary | ICD-10-CM | POA: Diagnosis not present

## 2018-07-18 DIAGNOSIS — Z87891 Personal history of nicotine dependence: Secondary | ICD-10-CM | POA: Insufficient documentation

## 2018-07-18 DIAGNOSIS — F329 Major depressive disorder, single episode, unspecified: Secondary | ICD-10-CM | POA: Diagnosis not present

## 2018-07-18 DIAGNOSIS — Z79899 Other long term (current) drug therapy: Secondary | ICD-10-CM | POA: Diagnosis not present

## 2018-07-18 DIAGNOSIS — M109 Gout, unspecified: Secondary | ICD-10-CM | POA: Diagnosis not present

## 2018-07-18 DIAGNOSIS — K219 Gastro-esophageal reflux disease without esophagitis: Secondary | ICD-10-CM | POA: Insufficient documentation

## 2018-07-18 DIAGNOSIS — M65811 Other synovitis and tenosynovitis, right shoulder: Secondary | ICD-10-CM | POA: Diagnosis not present

## 2018-07-18 DIAGNOSIS — M19011 Primary osteoarthritis, right shoulder: Secondary | ICD-10-CM | POA: Diagnosis not present

## 2018-07-18 DIAGNOSIS — Z4889 Encounter for other specified surgical aftercare: Secondary | ICD-10-CM | POA: Diagnosis not present

## 2018-07-18 DIAGNOSIS — M25811 Other specified joint disorders, right shoulder: Secondary | ICD-10-CM | POA: Insufficient documentation

## 2018-07-18 DIAGNOSIS — I10 Essential (primary) hypertension: Secondary | ICD-10-CM | POA: Diagnosis not present

## 2018-07-18 DIAGNOSIS — Z419 Encounter for procedure for purposes other than remedying health state, unspecified: Secondary | ICD-10-CM

## 2018-07-18 DIAGNOSIS — M7541 Impingement syndrome of right shoulder: Secondary | ICD-10-CM | POA: Diagnosis not present

## 2018-07-18 DIAGNOSIS — M75111 Incomplete rotator cuff tear or rupture of right shoulder, not specified as traumatic: Secondary | ICD-10-CM | POA: Diagnosis not present

## 2018-07-18 DIAGNOSIS — M75101 Unspecified rotator cuff tear or rupture of right shoulder, not specified as traumatic: Secondary | ICD-10-CM | POA: Diagnosis not present

## 2018-07-18 DIAGNOSIS — R9431 Abnormal electrocardiogram [ECG] [EKG]: Secondary | ICD-10-CM | POA: Diagnosis not present

## 2018-07-18 HISTORY — PX: SHOULDER ARTHROSCOPY WITH SUBACROMIAL DECOMPRESSION AND BICEP TENDON REPAIR: SHX5689

## 2018-07-18 SURGERY — SHOULDER ARTHROSCOPY WITH SUBACROMIAL DECOMPRESSION AND BICEP TENDON REPAIR
Anesthesia: General | Site: Shoulder | Laterality: Right

## 2018-07-18 MED ORDER — LIDOCAINE HCL (CARDIAC) PF 100 MG/5ML IV SOSY
PREFILLED_SYRINGE | INTRAVENOUS | Status: DC | PRN
  Administered 2018-07-18: 80 mg via INTRAVENOUS

## 2018-07-18 MED ORDER — SUGAMMADEX SODIUM 200 MG/2ML IV SOLN
INTRAVENOUS | Status: DC | PRN
  Administered 2018-07-18: 140.6 mg via INTRAVENOUS

## 2018-07-18 MED ORDER — LIDOCAINE HCL (PF) 1 % IJ SOLN
INTRAMUSCULAR | Status: AC
  Filled 2018-07-18: qty 5

## 2018-07-18 MED ORDER — LACTATED RINGERS IV SOLN
INTRAVENOUS | Status: DC | PRN
  Administered 2018-07-18: 31 mL

## 2018-07-18 MED ORDER — ASPIRIN EC 325 MG PO TBEC: 325.0000 mg | DELAYED_RELEASE_TABLET | Freq: Every day | ORAL | 0 refills | Status: AC

## 2018-07-18 MED ORDER — MIDAZOLAM HCL 2 MG/2ML IJ SOLN
1.0000 mg | Freq: Once | INTRAMUSCULAR | Status: AC
  Administered 2018-07-18: 1 mg via INTRAVENOUS

## 2018-07-18 MED ORDER — FENTANYL CITRATE (PF) 100 MCG/2ML IJ SOLN
50.0000 ug | Freq: Once | INTRAMUSCULAR | Status: AC
  Administered 2018-07-18: 50 ug via INTRAVENOUS

## 2018-07-18 MED ORDER — FENTANYL CITRATE (PF) 100 MCG/2ML IJ SOLN
INTRAMUSCULAR | Status: AC
  Filled 2018-07-18: qty 2

## 2018-07-18 MED ORDER — DEXAMETHASONE SODIUM PHOSPHATE 10 MG/ML IJ SOLN
INTRAMUSCULAR | Status: DC | PRN
  Administered 2018-07-18: 10 mg via INTRAVENOUS

## 2018-07-18 MED ORDER — PHENYLEPHRINE HCL 10 MG/ML IJ SOLN
INTRAMUSCULAR | Status: DC | PRN
  Administered 2018-07-18 (×2): 100 ug via INTRAVENOUS

## 2018-07-18 MED ORDER — CLINDAMYCIN PHOSPHATE 900 MG/50ML IV SOLN
900.0000 mg | Freq: Once | INTRAVENOUS | Status: AC
  Administered 2018-07-18: 900 mg via INTRAVENOUS

## 2018-07-18 MED ORDER — MIDAZOLAM HCL 2 MG/2ML IJ SOLN
INTRAMUSCULAR | Status: DC | PRN
  Administered 2018-07-18: 2 mg via INTRAVENOUS

## 2018-07-18 MED ORDER — BUPIVACAINE LIPOSOME 1.3 % IJ SUSP
INTRAMUSCULAR | Status: AC
  Filled 2018-07-18: qty 20

## 2018-07-18 MED ORDER — LACTATED RINGERS IV SOLN
INTRAVENOUS | Status: DC
  Administered 2018-07-18: 10:00:00 via INTRAVENOUS
  Administered 2018-07-18: 1000 mL via INTRAVENOUS

## 2018-07-18 MED ORDER — GLYCOPYRROLATE 0.2 MG/ML IJ SOLN
INTRAMUSCULAR | Status: DC | PRN
  Administered 2018-07-18: 0.2 mg via INTRAVENOUS

## 2018-07-18 MED ORDER — SUCCINYLCHOLINE CHLORIDE 20 MG/ML IJ SOLN
INTRAMUSCULAR | Status: DC | PRN
  Administered 2018-07-18: 100 mg via INTRAVENOUS

## 2018-07-18 MED ORDER — ROCURONIUM BROMIDE 100 MG/10ML IV SOLN
INTRAVENOUS | Status: DC | PRN
  Administered 2018-07-18: 30 mg via INTRAVENOUS
  Administered 2018-07-18: 20 mg via INTRAVENOUS
  Administered 2018-07-18: 10 mg via INTRAVENOUS
  Administered 2018-07-18: 40 mg via INTRAVENOUS

## 2018-07-18 MED ORDER — EPINEPHRINE 30 MG/30ML IJ SOLN
INTRAMUSCULAR | Status: AC
  Filled 2018-07-18: qty 1

## 2018-07-18 MED ORDER — ONDANSETRON HCL 4 MG/2ML IJ SOLN: 4.0000 mg | Freq: Once | INTRAMUSCULAR | Status: DC | PRN

## 2018-07-18 MED ORDER — MIDAZOLAM HCL 2 MG/2ML IJ SOLN
INTRAMUSCULAR | Status: AC
  Filled 2018-07-18: qty 2

## 2018-07-18 MED ORDER — OXYCODONE HCL 5 MG PO TABS: 5.0000 mg | ORAL_TABLET | ORAL | 0 refills | Status: DC | PRN

## 2018-07-18 MED ORDER — ONDANSETRON HCL 4 MG/2ML IJ SOLN
INTRAMUSCULAR | Status: DC | PRN
  Administered 2018-07-18 (×2): 4 mg via INTRAVENOUS

## 2018-07-18 MED ORDER — BUPIVACAINE HCL (PF) 0.5 % IJ SOLN
INTRAMUSCULAR | Status: AC
  Filled 2018-07-18: qty 10

## 2018-07-18 MED ORDER — ONDANSETRON 4 MG PO TBDP: 4.0000 mg | ORAL_TABLET | Freq: Three times a day (TID) | ORAL | 0 refills | Status: DC | PRN

## 2018-07-18 MED ORDER — FENTANYL CITRATE (PF) 100 MCG/2ML IJ SOLN
INTRAMUSCULAR | Status: DC | PRN
  Administered 2018-07-18 (×6): 50 ug via INTRAVENOUS

## 2018-07-18 MED ORDER — PROPOFOL 10 MG/ML IV BOLUS
INTRAVENOUS | Status: DC | PRN
  Administered 2018-07-18: 160 mg via INTRAVENOUS

## 2018-07-18 MED ORDER — FENTANYL CITRATE (PF) 100 MCG/2ML IJ SOLN: 25.0000 ug | INTRAMUSCULAR | Status: DC | PRN

## 2018-07-18 MED ORDER — ACETAMINOPHEN 500 MG PO TABS: 1000.0000 mg | ORAL_TABLET | Freq: Three times a day (TID) | ORAL | 2 refills | Status: AC

## 2018-07-18 MED ORDER — SODIUM CHLORIDE 0.9 % IV SOLN
INTRAVENOUS | Status: DC | PRN
  Administered 2018-07-18: 40 ug/min via INTRAVENOUS

## 2018-07-18 MED ORDER — LIDOCAINE-EPINEPHRINE 1 %-1:100000 IJ SOLN
INTRAMUSCULAR | Status: AC
  Filled 2018-07-18: qty 1

## 2018-07-18 SURGICAL SUPPLY — 93 items
ADAPTER IRRIG TUBE 2 SPIKE SOL (ADAPTER) ×6 IMPLANT
ANCHOR 2.3 SP SGL 1.2 XBRAID (Anchor) ×4 IMPLANT
ANCHOR 2.3MM SP SGL 1.2 XBRAID (Anchor) ×2 IMPLANT
ANCHOR 3.9 PEEK 3 CORKSCREW (Anchor) ×6 IMPLANT
ANCHOR SUT BIO SW 4.75X19.1 (Anchor) ×9 IMPLANT
ANCHOR SWIVELOCK BIO COMP (Anchor) ×3 IMPLANT
BLADE OSCILLATING/SAGITTAL (BLADE)
BLADE SW THK.38XMED LNG THN (BLADE) IMPLANT
BUR BR 5.5 12 FLUTE (BURR) ×3 IMPLANT
BUR RADIUS 4.0X18.5 (BURR) ×3 IMPLANT
CANNULA 5.75X7CM (CANNULA) ×1
CANNULA PART THRD DISP 5.75X7 (CANNULA) ×2 IMPLANT
CANNULA PARTIAL THREAD 2X7 (CANNULA) ×3 IMPLANT
CANNULA TWIST IN 8.25X9CM (CANNULA) IMPLANT
CHLORAPREP W/TINT 26ML (MISCELLANEOUS) ×3 IMPLANT
CLOSURE WOUND 1/2 X4 (GAUZE/BANDAGES/DRESSINGS)
COOLER POLAR GLACIER W/PUMP (MISCELLANEOUS) ×3 IMPLANT
COVER LIGHT HANDLE STERIS (MISCELLANEOUS) ×3 IMPLANT
COVER WAND RF STERILE (DRAPES) IMPLANT
CRADLE LAMINECT ARM (MISCELLANEOUS) ×6 IMPLANT
DERMABOND ADVANCED (GAUZE/BANDAGES/DRESSINGS) ×2
DERMABOND ADVANCED .7 DNX12 (GAUZE/BANDAGES/DRESSINGS) ×1 IMPLANT
DEVICE SUCT BLK HOLE OR FLOOR (MISCELLANEOUS) ×6 IMPLANT
DRAPE C-ARMOR (DRAPES) ×3 IMPLANT
DRAPE IMP U-DRAPE 54X76 (DRAPES) ×6 IMPLANT
DRAPE INCISE IOBAN 66X45 STRL (DRAPES) ×3 IMPLANT
DRAPE SHEET LG 3/4 BI-LAMINATE (DRAPES) ×6 IMPLANT
DRAPE STERI 35X30 U-POUCH (DRAPES) ×3 IMPLANT
DRAPE U-SHAPE 47X51 STRL (DRAPES) ×3 IMPLANT
DRSG TEGADERM 4X4.75 (GAUZE/BANDAGES/DRESSINGS) ×9 IMPLANT
ELECT REM PT RETURN 9FT ADLT (ELECTROSURGICAL) ×3
ELECTRODE REM PT RTRN 9FT ADLT (ELECTROSURGICAL) ×1 IMPLANT
GAUZE PETRO XEROFOAM 1X8 (MISCELLANEOUS) ×3 IMPLANT
GAUZE SPONGE 4X4 12PLY STRL (GAUZE/BANDAGES/DRESSINGS) ×3 IMPLANT
GLOVE BIOGEL PI IND STRL 8 (GLOVE) ×1 IMPLANT
GLOVE BIOGEL PI INDICATOR 8 (GLOVE) ×2
GLOVE SURG SYN 7.0 (GLOVE) ×9 IMPLANT
GLOVE SURG SYN 8.0 (GLOVE) ×12 IMPLANT
GOWN STRL REUS W/ TWL LRG LVL3 (GOWN DISPOSABLE) ×1 IMPLANT
GOWN STRL REUS W/TWL LRG LVL3 (GOWN DISPOSABLE) ×2
GOWN STRL REUS W/TWL LRG LVL4 (GOWN DISPOSABLE) ×3 IMPLANT
IV LACTATED RINGER IRRG 3000ML (IV SOLUTION) ×62
IV LR IRRIG 3000ML ARTHROMATIC (IV SOLUTION) ×31 IMPLANT
KIT CORKSCREW KNTLS 3.9 S/T/P (INSTRUMENTS) ×3 IMPLANT
KIT STABILIZATION SHOULDER (MISCELLANEOUS) ×3 IMPLANT
KIT TURNOVER KIT A (KITS) ×3 IMPLANT
LASSO QUICK PASS 45DEG CVD RT (SUTURE) ×3 IMPLANT
MANIFOLD NEPTUNE II (INSTRUMENTS) ×6 IMPLANT
MASK FACE SPIDER DISP (MASK) ×3 IMPLANT
MAT ABSORB  FLUID 56X50 GRAY (MISCELLANEOUS) ×4
MAT ABSORB FLUID 56X50 GRAY (MISCELLANEOUS) ×2 IMPLANT
NDL SAFETY ECLIPSE 18X1.5 (NEEDLE) ×1 IMPLANT
NEEDLE HYPO 18GX1.5 SHARP (NEEDLE) ×2
NEEDLE HYPO 22GX1.5 SAFETY (NEEDLE) ×3 IMPLANT
NEEDLE MAYO 6 CRC TAPER PT (NEEDLE) IMPLANT
NEEDLE SCORPION MULTI FIRE (NEEDLE) ×6 IMPLANT
NS IRRIG 500ML POUR BTL (IV SOLUTION) ×3 IMPLANT
PACK ARTHROSCOPY SHOULDER (MISCELLANEOUS) ×3 IMPLANT
PAD ABD DERMACEA PRESS 5X9 (GAUZE/BANDAGES/DRESSINGS) ×6 IMPLANT
PAD WRAPON POLAR SHDR XLG (MISCELLANEOUS) ×1 IMPLANT
SET TUBE SUCT SHAVER OUTFL 24K (TUBING) ×3 IMPLANT
SET TUBE TIP INTRA-ARTICULAR (MISCELLANEOUS) ×3 IMPLANT
SLING ULTRA II M (MISCELLANEOUS) IMPLANT
STAPLER SKIN PROX 35W (STAPLE) IMPLANT
STRAP SAFETY 5IN WIDE (MISCELLANEOUS) ×3 IMPLANT
STRIP CLOSURE SKIN 1/2X4 (GAUZE/BANDAGES/DRESSINGS) IMPLANT
SUT BONE WAX W31G (SUTURE) ×3 IMPLANT
SUT ETHILON 3-0 (SUTURE) ×3 IMPLANT
SUT ETHILON 4-0 (SUTURE)
SUT ETHILON 4-0 FS2 18XMFL BLK (SUTURE)
SUT LASSO 90 DEG SD STR (SUTURE) IMPLANT
SUT MNCRL 4-0 (SUTURE) ×2
SUT MNCRL 4-0 27XMFL (SUTURE) ×1
SUT PROLENE 0 CT 2 (SUTURE) IMPLANT
SUT TICRON 2-0 30IN 311381 (SUTURE) IMPLANT
SUT TIGER TAPE 7 IN WHITE (SUTURE) ×3 IMPLANT
SUT VIC AB 0 CT1 36 (SUTURE) ×3 IMPLANT
SUT VIC AB 2-0 CT2 27 (SUTURE) ×3 IMPLANT
SUT VICRYL 3-0 27IN (SUTURE) ×3 IMPLANT
SUTURE ETHLN 4-0 FS2 18XMF BLK (SUTURE) IMPLANT
SUTURE MNCRL 4-0 27XMF (SUTURE) ×1 IMPLANT
SUTURE TAPE 1.3 40 TPR END (SUTURE) ×3 IMPLANT
SUTURETAPE 1.3 40 TPR END (SUTURE) ×9
SYR 10ML LL (SYRINGE) ×3 IMPLANT
TAPE CLOTH 3X10 WHT NS LF (GAUZE/BANDAGES/DRESSINGS) ×3 IMPLANT
TAPE MICROFOAM 4IN (TAPE) ×3 IMPLANT
TUBING ARTHRO INFLOW-ONLY STRL (TUBING) ×3 IMPLANT
TUBING CONNECTING 10 (TUBING) ×2 IMPLANT
TUBING CONNECTING 10' (TUBING) ×1
WAND HAND CNTRL MULTIVAC 90 (MISCELLANEOUS) IMPLANT
WAND WEREWOLF FLOW 90D (MISCELLANEOUS) ×3 IMPLANT
WRAPON POLAR PAD SHDR XLG (MISCELLANEOUS) ×3
knotless Corkscrew ×3 IMPLANT

## 2018-07-18 NOTE — Anesthesia Post-op Follow-up Note (Signed)
Anesthesia QCDR form completed.        

## 2018-07-18 NOTE — H&P (Signed)
Paper H&P to be scanned into permanent record. H&P reviewed. No significant changes noted.  

## 2018-07-18 NOTE — Op Note (Addendum)
SURGERY DATE: 07/18/2018  PRE-OP DIAGNOSIS:  1. Right subacromial impingement 2. Right proximal biceps rupture 3. Right rotator cuff tear (subscapularis and supraspinatus) 4. Right acromioclavicular joint osteoarthritis  POST-OP DIAGNOSIS: 1. Right subacromial impingement 2. Right proximal biceps rupture 3. Right rotator cuff tear (subscapularis and supraspinatus) 4. Right acromioclavicular joint osteoarthritis  PROCEDURES:  1. Right mini-open rotator cuff repair (supraspinatus) 2. Right arthroscopic rotator cuff repair (subscapularis) 3. Right arthroscopic distal clavicle excision 4. Right arthroscopic extensive debridement of shoulder (glenohumeral and subacromial spaces) 5. Right arthroscopic subacromial decompression  SURGEON: Cato Mulligan, MD  ASSISTANT: Anitra Lauth, PA  ANESTHESIA: Gen with postoperative Exparil interscalene block  ESTIMATED BLOOD LOSS: 50cc  DRAINS:  none  TOTAL IV FLUIDS: per anesthesia   SPECIMENS: none  IMPLANTS:  - Arthrex 4.98mm SwiveLock x2 (superior subscapularis and posterior lateral row) - Arthrex 5.59mm SwiveLock x1 (anterior lateral row) - Arthrex 3.39mm Knotless PushLock (Inferior subscapularis) - Iconix SPEED double loaded with 1.2 and 2.68mm tape x 2   OPERATIVE FINDINGS:  Examination under anesthesia: A careful examination under anesthesia was performed.  Passive range of motion was: FF: 160; ER at side: 60; ER in abduction: 90; IR in abduction: 60.  Anterior load shift: NT.  Posterior load shift: NT.  Sulcus in neutral: NT.  Sulcus in ER: NT.    Intra-operative findings: A thorough arthroscopic examination of the shoulder was performed.  The findings are: 1. Biceps tendon: not visualized intraarticularly 2. Superior labrum: injected with surrounding synovitis 3. Posterior labrum and capsule: degenerative 4. Inferior capsule and inferior recess: normal 5. Glenoid cartilage surface: Grade 1-2 changes diffusely 6. Supraspinatus  attachment: full-thickness tear n 7. Posterior rotator cuff attachment: normal 8. Humeral head articular cartilage: focal areas of grade 3-4 changes, diffuse Grade 1 changes 9. Rotator interval: significant synovitis 10: Subscapularis tendon: significant tear of the superior fibers of the subscapularis, inferior fibers intact.  11. Anterior labrum: degenerative 12. IGHL: normal  OPERATIVE REPORT:   Indications for procedure: Deborah Jimenez is a 62 y.o. female with ~5 months of L shoulder pain that has failed non-operative management including activity modification, physical therapy, medical management and corticosteroid injection without adequate relief of symptoms. Clinical exam and MRI were suggestive of rotator cuff tear, subacromial impingement, proximal biceps rupture, and acromioclavicular joint arthritis. After discussion of risks, benefits, and alternatives to surgery, the patient elected to proceed.   Procedure in detail:  I identified Deborah Jimenez in the pre-operative holding area.  I marked the operative shoulder with my initials. I reviewed the risks and benefits of the proposed surgical intervention, and the patient (and/or patient's guardian) wished to proceed.  Anesthesia was then performed.  The patient was transferred to the operative suite and placed in the beach chair position.    SCDs were placed on the lower extremities. Appropriate IV antibiotics were administered prior to incision. The operative upper extremity was then prepped and draped in standard fashion. A time out was performed confirming the correct extremity, correct patient, and correct procedure.   I then created a standard posterior portal with an 11 blade. The glenohumeral joint was easily entered with a blunt trochar and the arthroscope introduced. The findings of diagnostic arthroscopy are described above. I debrided degenerative tissue including the synovitic tissue about the rotator interval and  anterior and posterior labrum. I then coagulated the inflamed synovium to obtain hemostasis and reduce the risk of post-operative swelling using an Arthrocare radiofrequency device.  The subscapularis tear was identified.  The comma tissue indicating the superolateral border of the subscapularis was identified readily.  The tip of the coracoid as well as the conjoined tendon and coracoacromial ligaments were visualized after debriding rotator interval tissue.  Tissue about the subscapularis was released anteriorly, superiorly, and posteriorly to allow for improved mobilization.  The lesser tuberosity footprint was prepared with a combination of electrocautery and an arthroscopic curette. First, an Arthrex 3.18mm CorkScrew was placed at the inferior aspect of the bare footprint. Suture was shuttled appropriately after passing through the inferior aspect of the subscapularis tear. This was tensioned appropriately to reduce the subscapularis to its footprint. Next, A Scorpion suture passing device was used to pass a SutureTape through the superior subscapularis tendon. This was passed through a 4.75 mm SwiveLock and placed into the lesser tuberosity at the superior footprint of the subscapularis tendon with the arm in a neutral position.  This appropriately reduced the subscapularis tear.  The arm was then internally and externally rotated and the subscapularis was noted to move appropriately with rotation.  Next, the arthroscope was then introduced into the subacromial space. A direct lateral portal was created with an 11-blade after spinal needle localization. An extensive subacromial bursectomy was performed using a combination of the shaver and Arthrocare wand. The entire acromial undersurface was exposed and the CA ligament was subperiosteally elevated to expose the anterior acromial hook. A 5.28mm barrel burr was used to create a flat anterior and lateral aspect of the acromion, converting it from a Type 2 to a  Type 1 acromion. Care was made to keep the deltoid fascia intact.  I then turned my attention to the arthroscopic distal clavicle excision. I identified the acromioclavicular joint. Surrounding bursal tissue was debrided and the edges of the joint were identified. I used the 5.40mm barrel burr to remove the distal clavicle parallel to the edge of the acromion. I was able to fit two widths of the burr into the space between the distal clavicle and acromion, signifying that I had removed ~72mm of distal clavicle. This was confirmed by viewing anteriorly and introducing a probe with measuring marks from the lateral portal. Fluid was evacuated from the shoulder.   Next, the mini-open rotator cuff repair (supraspinatus) was performed. A longitudinal incision from the anterolateral acromion ~7cm in length was made overlying the raphe between the anterior and middle heads of the deltoid. The raphe was identified and it was incised. Of note, the metal tip of the Metzenbaum scissors was noted to be cut off. Fluoroscopy was used to confirm that this metal piece was not in the patient's soft tissue. Next, The subacromial space was identified. Any remaining bursa was excised. The rotator cuff tear was identified. It was an L-shaped tear with the long limb of the L anterior.   The arm was then internally rotated.  The rotator cuff footprint was cleared of soft tissue and was roughened with a rongeur to improve healing.  Two Iconix SPEED anchors were placed just lateral to the articular margin. Two margin convergence sutures were placed in the long limb of the L using SutureTape. This converted the tear to a U-shaped tear.  All 8 strands of suture from the medial row anchors were passed through the cuff while holding it in a reduced fashion.  Two SwiveLock anchors were placed for the lateral row anchors with one limb of each of the four medial row sutures passed through each anchor.  This allowed for excellent  reapproximation of the rotator  cuff over its footprint. Of note, the anterior anchor did not achieve good fixation so a 5.68mm SwiveLock was placed closer to the bicipital groove for better fixation. The construct was stable with external and internal rotation.  The wound was thoroughly irrigated.  The deltoid split was closed with 0 Vicryl.  The subdermal layer was closed with 2-0 Vicryl.  The skin was closed with 4-0 Monocryl and Dermabond. The portals were closed with 3-0 Nylon. Xeroform was applied to the incisions. A sterile dressing was applied, followed by a Polar Care sleeve and a SlingShot shoulder immobilizer/sling. The patient was awakened from anesthesia without difficulty and was transferred to the PACU in stable condition.   Of note, assistance from a PA was essential to performing the surgery. PA assisted with patient positioning, retraction, and instrumentation. The surgery would have been more difficult and had longer operative time without PA assistance.     COMPLICATIONS: none  DISPOSITION: plan for discharge home after recovery in PACU   POSTOPERATIVE PLAN: Remain in sling (except hygiene and elbow/wrist/hand RoM exercises as instructed by PT) x 6 weeks and NWB for this time. PT to begin 3-4 days after surgery. Large Rotator cuff repair with subscapularis restrictions. ASA 325mg  daily x 2 weeks for DVT ppx.

## 2018-07-18 NOTE — Discharge Instructions (Signed)
Post-Op Instructions - Rotator Cuff Repair  1. Bracing: You will wear a shoulder immobilizer or sling for 6 weeks.   2. Driving: No driving for 3 weeks post-op. When driving, do not wear the immobilizer. Ideally, we recommend no driving for 6 weeks while sling is in place as one arm will be immobilized.   3. Activity: No active lifting for 2 months. Wrist, hand, and elbow motion only. Avoid lifting the upper arm away from the body except for hygiene. You are permitted to bend and straighten the elbow passively only (no active elbow motion). You may use your hand and wrist for typing, writing, and managing utensils (cutting food). Do not lift more than a coffee cup for 8 weeks.  When sleeping or resting, inclined positions (recliner chair or wedge pillow) and a pillow under the forearm for support may provide better comfort for up to 4 weeks.  Avoid long distance travel for 4 weeks.  Return to normal activities after rotator cuff repair repair normally takes 6 months on average. If rehab goes very well, may be able to do most activities at 4 months, except overhead or contact sports.  4. Physical Therapy: Begins 3-4 days after surgery, and proceed 1 time per week for the first 6 weeks, then 1-2 times per week from weeks 6-20 post-op.  5. Medications:  - You will be provided a prescription for narcotic pain medicine. After surgery, take 1-2 narcotic tablets every 4 hours if needed for severe pain.  - A prescription for anti-nausea medication will be provided in case the narcotic medicine causes nausea - take 1 tablet every 6 hours only if nauseated.   - Take tylenol 1000 mg (2 Extra Strength tablets or 3 regular strength) every 8 hours for pain.  May decrease or stop tylenol 5 days after surgery if you are having minimal pain. - Take ASA 325mg/day x 2 weeks to help prevent DVTs/PEs (blood clots).  - DO NOT take ANY nonsteroidal anti-inflammatory pain medications (Advil, Motrin, Ibuprofen, Aleve,  Naproxen, or Naprosyn). These medicines can inhibit healing of your shoulder repair.    If you are taking prescription medication for anxiety, depression, insomnia, muscle spasm, chronic pain, or for attention deficit disorder, you are advised that you are at a higher risk of adverse effects with use of narcotics post-op, including narcotic addiction/dependence, depressed breathing, death. If you use non-prescribed substances: alcohol, marijuana, cocaine, heroin, methamphetamines, etc., you are at a higher risk of adverse effects with use of narcotics post-op, including narcotic addiction/dependence, depressed breathing, death. You are advised that taking > 50 morphine milligram equivalents (MME) of narcotic pain medication per day results in twice the risk of overdose or death. For your prescription provided: oxycodone 5 mg - taking more than 6 tablets per day would result in > 50 morphine milligram equivalents (MME) of narcotic pain medication. Be advised that we will prescribe narcotics short-term, for acute post-operative pain only - 3 weeks for major operations such as shoulder repair/reconstruction surgeries.     6. Post-Op Appointment:  Your first post-op appointment will be 10-14 days post-op.  7. Work or School: For most, but not all procedures, we advise staying out of work or school for at least 1 to 2 weeks in order to recover from the stress of surgery and to allow time for healing.   If you need a work or school note this can be provided.   8. Smoking: If you are a smoker, you need to refrain from   smoking in the postoperative period. The nicotine in cigarettes will inhibit healing of your shoulder repair and decrease the chance of successful repair. Similarly, nicotine containing products (gum, patches) should be avoided.   Post-operative Brace: Apply and remove the brace you received as you were instructed to at the time of fitting and as described in detail as the braces  instructions for use indicate.  Wear the brace for the period of time prescribed by your physician.  The brace can be cleaned with soap and water and allowed to air dry only.  Should the brace result in increased pain, decreased feeling (numbness/tingling), increased swelling or an overall worsening of your medical condition, please contact your doctor immediately.  If an emergency situation occurs as a result of wearing the brace after normal business hours, please dial 911 and seek immediate medical attention.  Let your doctor know if you have any further questions about the brace issued to you. Refer to the shoulder sling instructions for use if you have any questions regarding the correct fit of your shoulder sling.  [place] [facility]: [phone]  Video that illustrates how to properly use a shoulder sling: "Instructions for Proper Use of an Orthopaedic Sling" [email or web address]   Interscalene Nerve Block (ISNB) Discharge Instructions   1.  For your surgery you have received an Interscalene Nerve Block. 2. Nerve Blocks affect many types of nerves, including nerves that control movement, pain and normal sensation.  You may experience feelings such as numbness, tingling, heaviness, weakness or the inability to move your arm or the feeling or sensation that your arm has "fallen asleep". 3. A nerve block can last for 2 - 36 hours or more depending on the medication used.  Usually the weakness wears off first.  The tingling and heaviness usually wear off next.  Finally you may start to notice pain.  Keep in mind that this may occur in any order.  once a nerve block starts to wear off it is usually completely gone within 60 minutes. 4. ISNB may cause mild shortness of breath, a hoarse voice, blurry vision, unequal pupils, or drooping of the face on the same side as the nerve block.  These symptoms will usually go away within 12 hours.  Very rarely the  procedure itself can cause mild seizures. 5. If needed, your surgeon will give you a prescription for pain medication.  It will take about 60 minutes for the oral pain medication to become fully effective.  So, it is recommended that you start taking this medication before the nerve block first begins to wear off, or when you first begin to feel discomfort. 6. Keep in mind that nerve blocks often wear off in the middle of the night.   If you are going to bed and the block has not started to wear off or you have not started to have any discomfort, consider setting an alarm for 2 to 3 hours, so you can assess your block.  If you notice the block is wearing off or you are starting to have discomfort, you can take your pain medication. 7. Take your pain medication only as prescribed.  Pain medication can cause sedation and decrease your breathing if you take more than you need for the level of pain that you have. 8. Nausea is a common side effect of many pain medications.  You may want to eat something before taking your pain medicine to prevent nausea. 9. After an Interscalene nerve block, you  cannot feel pain, pressure or extremes in temperature in the effected arm.  Because your arm is numb it is at an increased risk for injury.  To decrease the possibility of injury, please practice the following:  a. While you are awake change the position of your arm frequently to prevent too much pressure on any one area for prolonged periods of time. b.  If you have a cast or tight dressing, check the color or your fingers every couple of hours.  Call your surgeon with the appearance of any discoloration (white or blue). c. If you are given a sling to wear before you go home, please wear it  at all times until the block has completely worn off.  Do not get up at night without your sling. d. If you experience any problems or concerns, please contact your surgeon's office. e. If you experience severe or prolonged  shortness of breath go to the nearest emergency department.  AMBULATORY SURGERY  DISCHARGE INSTRUCTIONS   1) The drugs that you were given will stay in your system until tomorrow so for the next 24 hours you should not:  A) Drive an automobile B) Make any legal decisions C) Drink any alcoholic beverage   2) You may resume regular meals tomorrow.  Today it is better to start with liquids and gradually work up to solid foods.  You may eat anything you prefer, but it is better to start with liquids, then soup and crackers, and gradually work up to solid foods.   3) Please notify your doctor immediately if you have any unusual bleeding, trouble breathing, redness and pain at the surgery site, drainage, fever, or pain not relieved by medication.    4) Additional Instructions:    Please contact your physician with any problems or Same Day Surgery at [phone], Monday through Friday 6 am to 4 pm, or [place] at [facility] number at [phone].

## 2018-07-18 NOTE — Transfer of Care (Signed)
Immediate Anesthesia Transfer of Care Note  Patient: Deborah Jimenez  Procedure(s) Performed: SHOULDER ARTHROSCOPY VS. MINI OPEN ROTATOR CUFF REPAIR, SUBSCAPULARIS REPAIR, SUBACROMIAL DECOMPRESSION, DISTAL CLAVICLE EXCISION AND BICEP TENODESIS (Right Shoulder)  Patient Location: PACU  Anesthesia Type:General  Level of Consciousness: awake and sedated  Airway & Oxygen Therapy: Patient Spontanous Breathing and Patient connected to face mask oxygen  Post-op Assessment: Report given to RN and Post -op Vital signs reviewed and stable  Post vital signs: Reviewed and stable  Last Vitals:  Vitals Value Taken Time  BP 153/120 07/18/2018 11:32 AM  Temp    Pulse 113 07/18/2018 11:33 AM  Resp 16 07/18/2018 11:33 AM  SpO2 90 % 07/18/2018 11:33 AM  Vitals shown include unvalidated device data.  Last Pain:  Vitals:   07/18/18 0620  TempSrc: Tympanic  PainSc: 6          Complications: No apparent anesthesia complications
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
## 2018-07-18 NOTE — Anesthesia Procedure Notes (Signed)
Procedure Name: Intubation Date/Time: 07/18/2018 7:40 AM Performed by: Nelda Marseille, CRNA Pre-anesthesia Checklist: Patient identified, Patient being monitored, Timeout performed, Emergency Drugs available and Suction available Patient Re-evaluated:Patient Re-evaluated prior to induction Oxygen Delivery Method: Circle system utilized Preoxygenation: Pre-oxygenation with 100% oxygen Induction Type: IV induction Ventilation: Mask ventilation without difficulty Laryngoscope Size: Mac, 3 and McGraph Grade View: Grade II Tube type: Oral Tube size: 7.0 mm Number of attempts: 1 Airway Equipment and Method: Stylet Placement Confirmation: ETT inserted through vocal cords under direct vision,  positive ETCO2 and breath sounds checked- equal and bilateral Secured at: 21 cm Tube secured with: Tape Dental Injury: Teeth and Oropharynx as per pre-operative assessment

## 2018-07-19 ENCOUNTER — Encounter: Payer: Self-pay | Admitting: Orthopedic Surgery

## 2018-07-22 DIAGNOSIS — M6281 Muscle weakness (generalized): Secondary | ICD-10-CM | POA: Diagnosis not present

## 2018-07-22 DIAGNOSIS — M25511 Pain in right shoulder: Secondary | ICD-10-CM | POA: Diagnosis not present

## 2018-07-22 DIAGNOSIS — M25611 Stiffness of right shoulder, not elsewhere classified: Secondary | ICD-10-CM | POA: Diagnosis not present

## 2018-07-22 DIAGNOSIS — R29898 Other symptoms and signs involving the musculoskeletal system: Secondary | ICD-10-CM | POA: Diagnosis not present

## 2018-07-22 NOTE — Anesthesia Postprocedure Evaluation (Signed)
Anesthesia Post Note  Patient: Deborah Jimenez  Procedure(s) Performed: SHOULDER ARTHROSCOPY VS. MINI OPEN ROTATOR CUFF REPAIR, SUBSCAPULARIS REPAIR, SUBACROMIAL DECOMPRESSION, DISTAL CLAVICLE EXCISION AND BICEP TENODESIS (Right Shoulder)  Patient location during evaluation: PACU Anesthesia Type: General Level of consciousness: awake and alert Pain management: pain level controlled Vital Signs Assessment: post-procedure vital signs reviewed and stable Respiratory status: spontaneous breathing, nonlabored ventilation, respiratory function stable and patient connected to nasal cannula oxygen Cardiovascular status: blood pressure returned to baseline and stable Postop Assessment: no apparent nausea or vomiting Anesthetic complications: no     Last Vitals:  Vitals:   07/18/18 1240 07/18/18 1349  BP: 131/63 124/64  Pulse: 88 90  Resp: 16   Temp: 36.7 C   SpO2: 96% 96%    Last Pain:  Vitals:   07/19/18 0847  TempSrc:   PainSc: 5                  Martha Clan

## 2018-07-25 ENCOUNTER — Other Ambulatory Visit: Payer: Self-pay | Admitting: Psychiatry

## 2018-07-26 ENCOUNTER — Telehealth: Payer: Self-pay | Admitting: Psychiatry

## 2018-07-26 MED ORDER — ZOLPIDEM TARTRATE 10 MG PO TABS: ORAL_TABLET | ORAL | 1 refills | Status: DC

## 2018-07-26 NOTE — Telephone Encounter (Signed)
Sent Ambien to pharmacy 

## 2018-08-04 DIAGNOSIS — M6281 Muscle weakness (generalized): Secondary | ICD-10-CM | POA: Diagnosis not present

## 2018-08-04 DIAGNOSIS — M25611 Stiffness of right shoulder, not elsewhere classified: Secondary | ICD-10-CM | POA: Diagnosis not present

## 2018-08-04 DIAGNOSIS — M25511 Pain in right shoulder: Secondary | ICD-10-CM | POA: Diagnosis not present

## 2018-08-04 DIAGNOSIS — R29898 Other symptoms and signs involving the musculoskeletal system: Secondary | ICD-10-CM | POA: Diagnosis not present

## 2018-08-08 ENCOUNTER — Ambulatory Visit: Payer: Self-pay | Admitting: Family Medicine

## 2018-08-29 DIAGNOSIS — M25611 Stiffness of right shoulder, not elsewhere classified: Secondary | ICD-10-CM | POA: Diagnosis not present

## 2018-08-29 DIAGNOSIS — M25511 Pain in right shoulder: Secondary | ICD-10-CM | POA: Diagnosis not present

## 2018-08-29 DIAGNOSIS — M6281 Muscle weakness (generalized): Secondary | ICD-10-CM | POA: Diagnosis not present

## 2018-08-29 DIAGNOSIS — R29898 Other symptoms and signs involving the musculoskeletal system: Secondary | ICD-10-CM | POA: Diagnosis not present

## 2018-09-06 DIAGNOSIS — M25611 Stiffness of right shoulder, not elsewhere classified: Secondary | ICD-10-CM | POA: Diagnosis not present

## 2018-09-06 DIAGNOSIS — M6281 Muscle weakness (generalized): Secondary | ICD-10-CM | POA: Diagnosis not present

## 2018-09-06 DIAGNOSIS — R29898 Other symptoms and signs involving the musculoskeletal system: Secondary | ICD-10-CM | POA: Diagnosis not present

## 2018-09-06 DIAGNOSIS — M25511 Pain in right shoulder: Secondary | ICD-10-CM | POA: Diagnosis not present

## 2018-09-08 DIAGNOSIS — M25611 Stiffness of right shoulder, not elsewhere classified: Secondary | ICD-10-CM | POA: Diagnosis not present

## 2018-09-08 DIAGNOSIS — M25511 Pain in right shoulder: Secondary | ICD-10-CM | POA: Diagnosis not present

## 2018-09-08 DIAGNOSIS — R29898 Other symptoms and signs involving the musculoskeletal system: Secondary | ICD-10-CM | POA: Diagnosis not present

## 2018-09-08 DIAGNOSIS — M6281 Muscle weakness (generalized): Secondary | ICD-10-CM | POA: Diagnosis not present

## 2018-09-12 ENCOUNTER — Other Ambulatory Visit: Payer: Self-pay

## 2018-09-12 ENCOUNTER — Ambulatory Visit: Payer: Medicare HMO | Admitting: Psychiatry

## 2018-09-12 ENCOUNTER — Encounter: Payer: Self-pay | Admitting: Psychiatry

## 2018-09-12 VITALS — BP 131/70 | HR 71 | Temp 97.9°F

## 2018-09-12 DIAGNOSIS — F331 Major depressive disorder, recurrent, moderate: Secondary | ICD-10-CM | POA: Diagnosis not present

## 2018-09-12 DIAGNOSIS — F5105 Insomnia due to other mental disorder: Secondary | ICD-10-CM

## 2018-09-12 DIAGNOSIS — F411 Generalized anxiety disorder: Secondary | ICD-10-CM

## 2018-09-12 MED ORDER — AMITRIPTYLINE HCL 25 MG PO TABS: 25.0000 mg | ORAL_TABLET | Freq: Every day | ORAL | 1 refills | Status: DC

## 2018-09-12 MED ORDER — ZOLPIDEM TARTRATE 10 MG PO TABS: ORAL_TABLET | ORAL | 1 refills | Status: DC
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
## 2018-09-13 DIAGNOSIS — M6281 Muscle weakness (generalized): Secondary | ICD-10-CM | POA: Diagnosis not present

## 2018-09-13 DIAGNOSIS — M25611 Stiffness of right shoulder, not elsewhere classified: Secondary | ICD-10-CM | POA: Diagnosis not present

## 2018-09-13 DIAGNOSIS — R29898 Other symptoms and signs involving the musculoskeletal system: Secondary | ICD-10-CM | POA: Diagnosis not present

## 2018-09-13 DIAGNOSIS — M25511 Pain in right shoulder: Secondary | ICD-10-CM | POA: Diagnosis not present

## 2018-09-19 ENCOUNTER — Ambulatory Visit (INDEPENDENT_AMBULATORY_CARE_PROVIDER_SITE_OTHER): Payer: Medicare HMO

## 2018-09-19 VITALS — BP 122/78 | HR 72 | Resp 16 | Ht 63.0 in | Wt 163.4 lb

## 2018-09-19 DIAGNOSIS — Z1231 Encounter for screening mammogram for malignant neoplasm of breast: Secondary | ICD-10-CM

## 2018-09-19 DIAGNOSIS — Z599 Problem related to housing and economic circumstances, unspecified: Secondary | ICD-10-CM

## 2018-09-19 DIAGNOSIS — Z748 Other problems related to care provider dependency: Secondary | ICD-10-CM

## 2018-09-19 DIAGNOSIS — Z0000 Encounter for general adult medical examination without abnormal findings: Secondary | ICD-10-CM

## 2018-09-19 DIAGNOSIS — Z598 Other problems related to housing and economic circumstances: Secondary | ICD-10-CM | POA: Diagnosis not present

## 2018-09-19 DIAGNOSIS — Z780 Asymptomatic menopausal state: Secondary | ICD-10-CM | POA: Diagnosis not present
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
## 2018-09-19 NOTE — Patient Instructions (Signed)
Deborah Jimenez , Thank you for taking time to come for your Medicare Wellness Visit. I appreciate your ongoing commitment to your health goals. Please review the following plan we discussed and let me know if I can assist you in the future.   Screening recommendations/referrals: Colonoscopy: done 02/24/17. Repeat in 2028 Mammogram: Please call [phone] to schedule your mammogram and bone density screening.  Recommended yearly ophthalmology/optometry visit for glaucoma screening and checkup Recommended yearly dental visit for hygiene and checkup  Vaccinations: Influenza vaccine: done 05/06/18 Pneumococcal vaccine: done 08/11/17 Tdap vaccine: done 04/26/16 Shingles vaccine: Shingrix discussed. Please contact your pharmacy for coverage information.     Advanced directives: Advance directive discussed with you today. I have provided a copy for you to complete at home and have notarized. Once this is complete please bring a copy in to our office so we can scan it into your chart.  Conditions/risks identified: Recommend increasing physical activity as tolerated.   Next appointment: Please follow up in one year for your Medicare Annual Wellness visit.    Preventive Care 40-64 Years, Female Preventive care refers to lifestyle choices and visits with your health care provider that can promote health and wellness. What does preventive care include?  A yearly physical exam. This is also called an annual well check.  Dental exams once or twice a year.  Routine eye exams. Ask your health care provider how often you should have your eyes checked.  Personal lifestyle choices, including:  Daily care of your teeth and gums.  Regular physical activity.  Eating a healthy diet.  Avoiding tobacco and drug use.  Limiting alcohol use.  Practicing safe sex.  Taking low-dose aspirin daily starting at age 63.  Taking vitamin and mineral supplements as recommended by your health care  provider. What happens during an annual well check? The services and screenings done by your health care provider during your annual well check will depend on your age, overall health, lifestyle risk factors, and family history of disease. Counseling  Your health care provider may ask you questions about your:  Alcohol use.  Tobacco use.  Drug use.  Emotional well-being.  Home and relationship well-being.  Sexual activity.  Eating habits.  Work and work Statistician.  Method of birth control.  Menstrual cycle.  Pregnancy history. Screening  You may have the following tests or measurements:  Height, weight, and BMI.  Blood pressure.  Lipid and cholesterol levels. These may be checked every 5 years, or more frequently if you are over 80 years old.  Skin check.  Lung cancer screening. You may have this screening every year starting at age 84 if you have a 30-pack-year history of smoking and currently smoke or have quit within the past 15 years.  Fecal occult blood test (FOBT) of the stool. You may have this test every year starting at age 13.  Flexible sigmoidoscopy or colonoscopy. You may have a sigmoidoscopy every 5 years or a colonoscopy every 10 years starting at age 31.  Hepatitis C blood test.  Hepatitis B blood test.  Sexually transmitted disease (STD) testing.  Diabetes screening. This is done by checking your blood sugar (glucose) after you have not eaten for a while (fasting). You may have this done every 1-3 years.  Mammogram. This may be done every 1-2 years. Talk to your health care provider about when you should start having regular mammograms. This may depend on whether you have a family history of breast cancer.  BRCA-related cancer  screening. This may be done if you have a family history of breast, ovarian, tubal, or peritoneal cancers.  Pelvic exam and Pap test. This may be done every 3 years starting at age 62. Starting at age 37, this may be  done every 5 years if you have a Pap test in combination with an HPV test.  Bone density scan. This is done to screen for osteoporosis. You may have this scan if you are at high risk for osteoporosis. Discuss your test results, treatment options, and if necessary, the need for more tests with your health care provider. Vaccines  Your health care provider may recommend certain vaccines, such as:  Influenza vaccine. This is recommended every year.  Tetanus, diphtheria, and acellular pertussis (Tdap, Td) vaccine. You may need a Td booster every 10 years.  Zoster vaccine. You may need this after age 79.  Pneumococcal 13-valent conjugate (PCV13) vaccine. You may need this if you have certain conditions and were not previously vaccinated.  Pneumococcal polysaccharide (PPSV23) vaccine. You may need one or two doses if you smoke cigarettes or if you have certain conditions. Talk to your health care provider about which screenings and vaccines you need and how often you need them. This information is not intended to replace advice given to you by your health care provider. Make sure you discuss any questions you have with your health care provider. Document Released: 08/09/2015 Document Revised: 04/01/2016 Document Reviewed: 05/14/2015 Elsevier Interactive Patient Education  2017 [place] Prevention in the Home Falls can cause injuries. They can happen to people of all ages. There are many things you can do to make your home safe and to help prevent falls. What can I do on the outside of my home?  Regularly fix the edges of walkways and driveways and fix any cracks.  Remove anything that might make you trip as you walk through a door, such as a raised step or threshold.  Trim any bushes or trees on the path to your home.  Use bright outdoor lighting.  Clear any walking paths of anything that might make someone trip, such as rocks or tools.  Regularly check to see if  handrails are loose or broken. Make sure that both sides of any steps have handrails.  Any raised decks and porches should have guardrails on the edges.  Have any leaves, snow, or ice cleared regularly.  Use sand or salt on walking paths during winter.  Clean up any spills in your garage right away. This includes oil or grease spills. What can I do in the bathroom?  Use night lights.  Install grab bars by the toilet and in the tub and shower. Do not use towel bars as grab bars.  Use non-skid mats or decals in the tub or shower.  If you need to sit down in the shower, use a plastic, non-slip stool.  Keep the floor dry. Clean up any water that spills on the floor as soon as it happens.  Remove soap buildup in the tub or shower regularly.  Attach bath mats securely with double-sided non-slip rug tape.  Do not have throw rugs and other things on the floor that can make you trip. What can I do in the bedroom?  Use night lights.  Make sure that you have a light by your bed that is easy to reach.  Do not use any sheets or blankets that are too big for your bed. They should not  hang down onto the floor.  Have a firm chair that has side arms. You can use this for support while you get dressed.  Do not have throw rugs and other things on the floor that can make you trip. What can I do in the kitchen?  Clean up any spills right away.  Avoid walking on wet floors.  Keep items that you use a lot in easy-to-reach places.  If you need to reach something above you, use a strong step stool that has a grab bar.  Keep electrical cords out of the way.  Do not use floor polish or wax that makes floors slippery. If you must use wax, use non-skid floor wax.  Do not have throw rugs and other things on the floor that can make you trip. What can I do with my stairs?  Do not leave any items on the stairs.  Make sure that there are handrails on both sides of the stairs and use them. Fix  handrails that are broken or loose. Make sure that handrails are as long as the stairways.  Check any carpeting to make sure that it is firmly attached to the stairs. Fix any carpet that is loose or worn.  Avoid having throw rugs at the top or bottom of the stairs. If you do have throw rugs, attach them to the floor with carpet tape.  Make sure that you have a light switch at the top of the stairs and the bottom of the stairs. If you do not have them, ask someone to add them for you. What else can I do to help prevent falls?  Wear shoes that:  Do not have high heels.  Have rubber bottoms.  Are comfortable and fit you well.  Are closed at the toe. Do not wear sandals.  If you use a stepladder:  Make sure that it is fully opened. Do not climb a closed stepladder.  Make sure that both sides of the stepladder are locked into place.  Ask someone to hold it for you, if possible.  Clearly mark and make sure that you can see:  Any grab bars or handrails.  First and last steps.  Where the edge of each step is.  Use tools that help you move around (mobility aids) if they are needed. These include:  Canes.  Walkers.  Scooters.  Crutches.  Turn on the lights when you go into a dark area. Replace any light bulbs as soon as they burn out.  Set up your furniture so you have a clear path. Avoid moving your furniture around.  If any of your floors are uneven, fix them.  If there are any pets around you, be aware of where they are.  Review your medicines with your doctor. Some medicines can make you feel dizzy. This can increase your chance of falling. Ask your doctor what other things that you can do to help prevent falls. This information is not intended to replace advice given to you by your health care provider. Make sure you discuss any questions you have with your health care provider. Document Released: 05/09/2009 Document Revised: 12/19/2015 Document Reviewed:  08/17/2014 Elsevier Interactive Patient Education  2017 [employer].

## 2018-10-07 ENCOUNTER — Telehealth: Payer: Self-pay

## 2018-10-07 NOTE — Telephone Encounter (Signed)
10/07/2018 Left message on voicemail to return my call.MA

## 2018-10-13 ENCOUNTER — Telehealth: Payer: Self-pay

## 2018-10-13 NOTE — Telephone Encounter (Signed)
10/13/2018 Spoke with patient about transportation, food and financial resources. Also emailed resources per patient request. Verified email address. Will follow-up to make sure patient received emailed resources. MA

## 2018-10-17 ENCOUNTER — Telehealth: Payer: Self-pay

## 2018-10-17 NOTE — Telephone Encounter (Signed)
10/17/2018 Called to follow-up with patient about receiving emailed transportation and food pantry resources. Call went directly to voicemail. Left message with my name and number to call on voicemail.MA

## 2018-10-27 ENCOUNTER — Telehealth: Payer: Self-pay | Admitting: Psychiatry

## 2018-10-27 DIAGNOSIS — F5105 Insomnia due to other mental disorder: Secondary | ICD-10-CM

## 2018-10-27 MED ORDER — AMITRIPTYLINE HCL 25 MG PO TABS: 25.0000 mg | ORAL_TABLET | Freq: Every day | ORAL | 1 refills | Status: DC

## 2018-10-27 NOTE — Telephone Encounter (Signed)
Sent New script with clarification that she is on Elavil 25 mg back to [facility].

## 2018-10-31 ENCOUNTER — Other Ambulatory Visit: Payer: Self-pay | Admitting: Psychiatry

## 2018-10-31 ENCOUNTER — Telehealth: Payer: Self-pay

## 2018-10-31 DIAGNOSIS — F5105 Insomnia due to other mental disorder: Secondary | ICD-10-CM

## 2018-10-31 NOTE — Telephone Encounter (Signed)
Pls let patient know her dosage was reduced to 25 mg during one of her visits due to drug to drug interaction concern and that she is on 25 mg only. thanks

## 2018-10-31 NOTE — Telephone Encounter (Signed)
pt called left message that you did not send in the amitriptyline correctly.  you put down one by mouth at bedtime but she states she take 3. she needs you to send in updated rx.

## 2018-10-31 NOTE — Telephone Encounter (Signed)
Left message for patient to call office back about medications

## 2018-11-01 NOTE — Telephone Encounter (Signed)
Ok thanks 

## 2018-11-01 NOTE — Telephone Encounter (Signed)
Pt states that she has been on 3 for the last 2 years and now she has none.  And she states she has not had any interaction concerns about that medication because she has taken 3 at night for the last 2 years,.

## 2018-11-01 NOTE — Telephone Encounter (Signed)
Jesse pls let patient know I have sent 25 mg at bedtime to pharmacy and she should not be on 3 pills. pls schedule an appointment if she has more concerns. thanks

## 2018-11-03 ENCOUNTER — Telehealth: Payer: Self-pay | Admitting: Psychiatry

## 2018-11-03 NOTE — Telephone Encounter (Signed)
Returned call to patient , left a lengthy message about her meds and not to over use Elavil.  Discussed to call back to make an appointment sooner since she is having problem.

## 2018-11-03 NOTE — Telephone Encounter (Signed)
called and left another message letting her know that another message was sent to find out if you would send in something to help her sleep since she has taken all of medication amitriptline.

## 2018-11-03 NOTE — Telephone Encounter (Signed)
Pt finally called back and states she needs something to help her sleep because she doesn't have any more of the medication left because she didn't know that you wanted her to take one. She has been taken 3 .  So now she is completely out and needs something to help her sleep.

## 2018-11-04 ENCOUNTER — Other Ambulatory Visit: Payer: Self-pay | Admitting: Family Medicine

## 2018-11-04 DIAGNOSIS — M1990 Unspecified osteoarthritis, unspecified site: Secondary | ICD-10-CM

## 2018-11-08 ENCOUNTER — Other Ambulatory Visit: Payer: Self-pay | Admitting: Family Medicine

## 2018-11-08 ENCOUNTER — Other Ambulatory Visit: Payer: Self-pay | Admitting: Psychiatry

## 2018-11-08 DIAGNOSIS — F5105 Insomnia due to other mental disorder: Secondary | ICD-10-CM

## 2018-11-08 DIAGNOSIS — M1990 Unspecified osteoarthritis, unspecified site: Secondary | ICD-10-CM

## 2018-11-09 ENCOUNTER — Encounter: Payer: Self-pay | Admitting: Family Medicine

## 2018-11-10 ENCOUNTER — Other Ambulatory Visit: Payer: Self-pay

## 2018-11-10 DIAGNOSIS — M1990 Unspecified osteoarthritis, unspecified site: Secondary | ICD-10-CM

## 2018-11-10 MED ORDER — MELOXICAM 7.5 MG PO TABS: 7.5000 mg | ORAL_TABLET | Freq: Every day | ORAL | 0 refills | Status: DC

## 2018-12-06 ENCOUNTER — Encounter: Payer: Self-pay | Admitting: Family Medicine

## 2018-12-06 ENCOUNTER — Ambulatory Visit (INDEPENDENT_AMBULATORY_CARE_PROVIDER_SITE_OTHER): Payer: Medicare HMO | Admitting: Family Medicine

## 2018-12-06 ENCOUNTER — Other Ambulatory Visit
Admission: RE | Admit: 2018-12-06 | Discharge: 2018-12-06 | Disposition: A | Discharge status: Home / Self Care | Payer: Medicare HMO | Attending: Family Medicine | Admitting: Family Medicine

## 2018-12-06 ENCOUNTER — Other Ambulatory Visit: Payer: Self-pay

## 2018-12-06 VITALS — BP 140/88 | HR 64 | Ht 63.0 in | Wt 162.0 lb

## 2018-12-06 DIAGNOSIS — I10 Essential (primary) hypertension: Secondary | ICD-10-CM | POA: Diagnosis not present

## 2018-12-06 DIAGNOSIS — K219 Gastro-esophageal reflux disease without esophagitis: Secondary | ICD-10-CM | POA: Diagnosis not present

## 2018-12-06 DIAGNOSIS — E039 Hypothyroidism, unspecified: Secondary | ICD-10-CM

## 2018-12-06 DIAGNOSIS — M1990 Unspecified osteoarthritis, unspecified site: Secondary | ICD-10-CM | POA: Diagnosis not present

## 2018-12-06 LAB — LIPID PANEL
Cholesterol: 216 mg/dL — ABNORMAL HIGH (ref 0–200)
HDL: 78 mg/dL (ref >40)
LDL Cholesterol: 115 mg/dL — ABNORMAL HIGH (ref 0–99)
Total CHOL/HDL Ratio: 2.8 RATIO
Triglycerides: 115 mg/dL (ref <150)
VLDL: 23 mg/dL (ref 0–40)

## 2018-12-06 LAB — T4, FREE: Free T4: 0.92 ng/dL (ref 0.82–1.77)

## 2018-12-06 LAB — TSH: TSH: 0.06 u[IU]/mL — ABNORMAL LOW (ref 0.350–4.500)

## 2018-12-06 MED ORDER — LEVOTHYROXINE SODIUM 200 MCG PO TABS: 200.0000 ug | ORAL_TABLET | Freq: Every day | ORAL | 1 refills | Status: DC

## 2018-12-06 MED ORDER — LISINOPRIL 20 MG PO TABS: 20.0000 mg | ORAL_TABLET | Freq: Every day | ORAL | 1 refills | Status: DC

## 2018-12-06 MED ORDER — LEVOTHYROXINE SODIUM 25 MCG PO TABS: 25.0000 ug | ORAL_TABLET | Freq: Every day | ORAL | 1 refills | Status: DC

## 2018-12-06 MED ORDER — PANTOPRAZOLE SODIUM 40 MG PO TBEC: 40.0000 mg | DELAYED_RELEASE_TABLET | Freq: Every day | ORAL | 1 refills | Status: DC

## 2018-12-06 MED ORDER — CELECOXIB 100 MG PO CAPS: 100.0000 mg | ORAL_CAPSULE | Freq: Two times a day (BID) | ORAL | 1 refills | Status: DC

## 2018-12-06 MED ORDER — MELOXICAM 7.5 MG PO TABS: 7.5000 mg | ORAL_TABLET | Freq: Every day | ORAL | 5 refills | Status: DC

## 2018-12-06 MED ORDER — ETODOLAC 200 MG PO CAPS: 200.0000 mg | ORAL_CAPSULE | Freq: Two times a day (BID) | ORAL | 3 refills | Status: DC
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
## 2018-12-06 NOTE — Addendum Note (Signed)
Addended by: Fredderick Severance on: 12/06/2018 04:09 PM   Modules accepted: Orders

## 2018-12-07 LAB — T3, FREE: T3, Free: 2.3 pg/mL (ref 2.0–4.4)

## 2018-12-08 ENCOUNTER — Other Ambulatory Visit: Payer: Self-pay

## 2018-12-08 ENCOUNTER — Ambulatory Visit (INDEPENDENT_AMBULATORY_CARE_PROVIDER_SITE_OTHER): Payer: Medicare HMO | Admitting: Psychiatry

## 2018-12-08 ENCOUNTER — Telehealth: Payer: Self-pay

## 2018-12-08 ENCOUNTER — Encounter: Payer: Self-pay | Admitting: Psychiatry

## 2018-12-08 DIAGNOSIS — F331 Major depressive disorder, recurrent, moderate: Secondary | ICD-10-CM | POA: Diagnosis not present

## 2018-12-08 DIAGNOSIS — F5105 Insomnia due to other mental disorder: Secondary | ICD-10-CM

## 2018-12-08 DIAGNOSIS — F320 Major depressive disorder, single episode, mild: Secondary | ICD-10-CM

## 2018-12-08 DIAGNOSIS — F411 Generalized anxiety disorder: Secondary | ICD-10-CM | POA: Diagnosis not present

## 2018-12-08 MED ORDER — DULOXETINE HCL 60 MG PO CPEP: 60.0000 mg | ORAL_CAPSULE | Freq: Every day | ORAL | 1 refills | Status: DC

## 2018-12-08 MED ORDER — LAMOTRIGINE 25 MG PO TABS: 50.0000 mg | ORAL_TABLET | Freq: Every day | ORAL | 1 refills | Status: DC

## 2018-12-08 MED ORDER — ARIPIPRAZOLE 2 MG PO TABS: 2.0000 mg | ORAL_TABLET | Freq: Every day | ORAL | 0 refills | Status: DC

## 2018-12-08 MED ORDER — DULOXETINE HCL 30 MG PO CPEP: 30.0000 mg | ORAL_CAPSULE | Freq: Every day | ORAL | 1 refills | Status: DC
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
## 2018-12-08 NOTE — Telephone Encounter (Signed)
pt called states that her copay is $120 for the abilify and she can not afford that.

## 2018-12-08 NOTE — Telephone Encounter (Signed)
Spoke to patient , discussed stopping Abilify due to cost. Start Lamictal 25 mg for 2 weeks and increase to 50 mg after that.

## 2018-12-12 ENCOUNTER — Telehealth: Payer: Self-pay | Admitting: Psychiatry

## 2018-12-12 ENCOUNTER — Telehealth: Payer: Self-pay

## 2018-12-12 NOTE — Telephone Encounter (Signed)
pt call asked if she can get a rx for ambien sent to [facility] in [place] and she can use good rx saving card there and get it cheaper.

## 2018-12-12 NOTE — Telephone Encounter (Signed)
Returned call to patient - left message that Ambien was sent over to [facility] , [place] last time. She has 1 refill available.

## 2019-01-10 ENCOUNTER — Ambulatory Visit (INDEPENDENT_AMBULATORY_CARE_PROVIDER_SITE_OTHER): Payer: Medicare HMO | Admitting: Psychiatry

## 2019-01-10 ENCOUNTER — Other Ambulatory Visit: Payer: Self-pay

## 2019-01-10 ENCOUNTER — Encounter: Payer: Self-pay | Admitting: Psychiatry

## 2019-01-10 DIAGNOSIS — F5105 Insomnia due to other mental disorder: Secondary | ICD-10-CM | POA: Diagnosis not present

## 2019-01-10 DIAGNOSIS — F331 Major depressive disorder, recurrent, moderate: Secondary | ICD-10-CM | POA: Diagnosis not present

## 2019-01-10 DIAGNOSIS — F411 Generalized anxiety disorder: Secondary | ICD-10-CM | POA: Insufficient documentation

## 2019-01-10 MED ORDER — ARIPIPRAZOLE 15 MG PO TABS: 7.5000 mg | ORAL_TABLET | Freq: Every day | ORAL | 0 refills | Status: DC
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
## 2019-02-04 ENCOUNTER — Encounter: Payer: Self-pay | Admitting: Family Medicine

## 2019-02-04 ENCOUNTER — Other Ambulatory Visit: Payer: Self-pay | Admitting: Family Medicine

## 2019-02-04 DIAGNOSIS — M1990 Unspecified osteoarthritis, unspecified site: Secondary | ICD-10-CM

## 2019-02-06 ENCOUNTER — Other Ambulatory Visit: Payer: Self-pay | Admitting: Family Medicine

## 2019-02-06 ENCOUNTER — Other Ambulatory Visit: Payer: Self-pay

## 2019-02-06 DIAGNOSIS — M1990 Unspecified osteoarthritis, unspecified site: Secondary | ICD-10-CM

## 2019-02-21 ENCOUNTER — Other Ambulatory Visit: Payer: Self-pay

## 2019-02-27 DIAGNOSIS — H524 Presbyopia: Secondary | ICD-10-CM | POA: Diagnosis not present

## 2019-02-27 DIAGNOSIS — H52209 Unspecified astigmatism, unspecified eye: Secondary | ICD-10-CM | POA: Diagnosis not present

## 2019-02-27 DIAGNOSIS — H5213 Myopia, bilateral: Secondary | ICD-10-CM | POA: Diagnosis not present

## 2019-03-03 ENCOUNTER — Ambulatory Visit (INDEPENDENT_AMBULATORY_CARE_PROVIDER_SITE_OTHER): Payer: Medicare HMO | Admitting: Psychiatry

## 2019-03-03 ENCOUNTER — Encounter: Payer: Self-pay | Admitting: Psychiatry

## 2019-03-03 ENCOUNTER — Other Ambulatory Visit: Payer: Self-pay

## 2019-03-03 DIAGNOSIS — F411 Generalized anxiety disorder: Secondary | ICD-10-CM

## 2019-03-03 DIAGNOSIS — F5105 Insomnia due to other mental disorder: Secondary | ICD-10-CM | POA: Diagnosis not present

## 2019-03-03 DIAGNOSIS — F331 Major depressive disorder, recurrent, moderate: Secondary | ICD-10-CM | POA: Diagnosis not present

## 2019-03-03 MED ORDER — BUPROPION HCL 100 MG PO TABS: 100.0000 mg | ORAL_TABLET | Freq: Every morning | ORAL | 0 refills | Status: DC
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
## 2019-03-06 ENCOUNTER — Encounter: Payer: Self-pay | Admitting: Family Medicine

## 2019-03-07 ENCOUNTER — Encounter: Payer: Self-pay | Admitting: Family Medicine

## 2019-03-08 ENCOUNTER — Other Ambulatory Visit: Payer: Self-pay

## 2019-03-08 DIAGNOSIS — Z91030 Bee allergy status: Secondary | ICD-10-CM

## 2019-03-08 MED ORDER — EPINEPHRINE 0.3 MG/0.3ML IJ SOAJ: 0.3000 mg | INTRAMUSCULAR | 1 refills | Status: AC | PRN

## 2019-03-15 ENCOUNTER — Other Ambulatory Visit: Payer: Self-pay | Admitting: Psychiatry

## 2019-03-15 DIAGNOSIS — F5105 Insomnia due to other mental disorder: Secondary | ICD-10-CM

## 2019-03-31 ENCOUNTER — Other Ambulatory Visit: Payer: Self-pay

## 2019-03-31 ENCOUNTER — Encounter: Payer: Self-pay | Admitting: Psychiatry

## 2019-03-31 ENCOUNTER — Ambulatory Visit (INDEPENDENT_AMBULATORY_CARE_PROVIDER_SITE_OTHER): Payer: Medicare HMO | Admitting: Psychiatry

## 2019-03-31 DIAGNOSIS — F331 Major depressive disorder, recurrent, moderate: Secondary | ICD-10-CM | POA: Diagnosis not present

## 2019-03-31 DIAGNOSIS — F411 Generalized anxiety disorder: Secondary | ICD-10-CM | POA: Diagnosis not present

## 2019-03-31 DIAGNOSIS — F5105 Insomnia due to other mental disorder: Secondary | ICD-10-CM
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
## 2019-05-25 ENCOUNTER — Other Ambulatory Visit: Payer: Self-pay | Admitting: Psychiatry

## 2019-05-25 ENCOUNTER — Other Ambulatory Visit: Payer: Self-pay | Admitting: Family Medicine

## 2019-05-25 DIAGNOSIS — F331 Major depressive disorder, recurrent, moderate: Secondary | ICD-10-CM

## 2019-05-25 DIAGNOSIS — M1990 Unspecified osteoarthritis, unspecified site: Secondary | ICD-10-CM

## 2019-06-08 ENCOUNTER — Ambulatory Visit (INDEPENDENT_AMBULATORY_CARE_PROVIDER_SITE_OTHER): Payer: Medicare HMO | Admitting: Family Medicine

## 2019-06-08 ENCOUNTER — Other Ambulatory Visit
Admission: RE | Admit: 2019-06-08 | Discharge: 2019-06-08 | Disposition: A | Discharge status: Home / Self Care | Payer: Medicare HMO | Attending: Family Medicine | Admitting: Family Medicine

## 2019-06-08 ENCOUNTER — Encounter: Payer: Self-pay | Admitting: Family Medicine

## 2019-06-08 ENCOUNTER — Other Ambulatory Visit: Payer: Self-pay

## 2019-06-08 VITALS — BP 120/70 | HR 68 | Ht 63.0 in | Wt 176.0 lb

## 2019-06-08 DIAGNOSIS — D508 Other iron deficiency anemias: Secondary | ICD-10-CM | POA: Insufficient documentation

## 2019-06-08 DIAGNOSIS — E039 Hypothyroidism, unspecified: Secondary | ICD-10-CM | POA: Diagnosis not present

## 2019-06-08 DIAGNOSIS — M1990 Unspecified osteoarthritis, unspecified site: Secondary | ICD-10-CM

## 2019-06-08 DIAGNOSIS — I10 Essential (primary) hypertension: Secondary | ICD-10-CM | POA: Diagnosis not present

## 2019-06-08 DIAGNOSIS — E7801 Familial hypercholesterolemia: Secondary | ICD-10-CM

## 2019-06-08 DIAGNOSIS — K219 Gastro-esophageal reflux disease without esophagitis: Secondary | ICD-10-CM | POA: Diagnosis not present

## 2019-06-08 LAB — LIPID PANEL
Cholesterol: 182 mg/dL (ref 0–200)
HDL: 64 mg/dL (ref >40)
LDL Cholesterol: 91 mg/dL (ref 0–99)
Total CHOL/HDL Ratio: 2.8 RATIO
Triglycerides: 134 mg/dL (ref <150)
VLDL: 27 mg/dL (ref 0–40)

## 2019-06-08 LAB — RENAL FUNCTION PANEL
Albumin: 4.2 g/dL (ref 3.5–5.0)
Anion gap: 9 (ref 5–15)
BUN: 24 mg/dL — ABNORMAL HIGH (ref 8–23)
CO2: 21 mmol/L — ABNORMAL LOW (ref 22–32)
Calcium: 9.1 mg/dL (ref 8.9–10.3)
Chloride: 108 mmol/L (ref 98–111)
Creatinine, Ser: 0.66 mg/dL (ref 0.44–1.00)
GFR calc Af Amer: >60 mL/min (ref >60)
GFR calc non Af Amer: >60 mL/min (ref >60)
Glucose, Bld: 96 mg/dL (ref 70–99)
Phosphorus: 3.8 mg/dL (ref 2.5–4.6)
Potassium: 4.2 mmol/L (ref 3.5–5.1)
Sodium: 138 mmol/L (ref 135–145)

## 2019-06-08 LAB — CBC WITH DIFFERENTIAL/PLATELET
Abs Immature Granulocytes: 0.02 10*3/uL (ref 0.00–0.07)
Basophils Absolute: 0.1 10*3/uL (ref 0.0–0.1)
Basophils Relative: 1 %
Eosinophils Absolute: 0.1 10*3/uL (ref 0.0–0.5)
Eosinophils Relative: 2 %
HCT: 38.6 % (ref 36.0–46.0)
Hemoglobin: 12.7 g/dL (ref 12.0–15.0)
Immature Granulocytes: 0 %
Lymphocytes Relative: 33 %
Lymphs Abs: 2.2 10*3/uL (ref 0.7–4.0)
MCH: 31.4 pg (ref 26.0–34.0)
MCHC: 32.9 g/dL (ref 30.0–36.0)
MCV: 95.3 fL (ref 80.0–100.0)
Monocytes Absolute: 0.4 10*3/uL (ref 0.1–1.0)
Monocytes Relative: 6 %
Neutro Abs: 3.8 10*3/uL (ref 1.7–7.7)
Neutrophils Relative %: 58 %
Platelets: 254 10*3/uL (ref 150–400)
RBC: 4.05 MIL/uL (ref 3.87–5.11)
RDW: 12.7 % (ref 11.5–15.5)
WBC: 6.5 10*3/uL (ref 4.0–10.5)
nRBC: 0 % (ref 0.0–0.2)

## 2019-06-08 LAB — TSH: TSH: 0.018 u[IU]/mL — ABNORMAL LOW (ref 0.350–4.500)

## 2019-06-08 MED ORDER — LISINOPRIL 20 MG PO TABS: 20.0000 mg | ORAL_TABLET | Freq: Every day | ORAL | 1 refills | Status: AC

## 2019-06-08 MED ORDER — LEVOTHYROXINE SODIUM 200 MCG PO TABS: 200.0000 ug | ORAL_TABLET | Freq: Every day | ORAL | 1 refills | Status: DC

## 2019-06-08 MED ORDER — CELECOXIB 100 MG PO CAPS: 100.0000 mg | ORAL_CAPSULE | Freq: Two times a day (BID) | ORAL | 1 refills | Status: DC

## 2019-06-08 MED ORDER — LEVOTHYROXINE SODIUM 25 MCG PO TABS: 25.0000 ug | ORAL_TABLET | Freq: Every day | ORAL | 1 refills | Status: DC

## 2019-06-08 MED ORDER — FAMOTIDINE 40 MG PO TABS: 40.0000 mg | ORAL_TABLET | Freq: Every day | ORAL | 1 refills | Status: DC
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
## 2019-06-09 ENCOUNTER — Other Ambulatory Visit: Payer: Self-pay | Admitting: Psychiatry

## 2019-06-09 ENCOUNTER — Other Ambulatory Visit: Payer: Self-pay

## 2019-06-09 DIAGNOSIS — F5105 Insomnia due to other mental disorder: Secondary | ICD-10-CM

## 2019-06-09 NOTE — Progress Notes (Unsigned)
Decrease to 289mcg levo

## 2019-06-20 DIAGNOSIS — G5603 Carpal tunnel syndrome, bilateral upper limbs: Secondary | ICD-10-CM | POA: Diagnosis not present

## 2019-06-20 DIAGNOSIS — Z9889 Other specified postprocedural states: Secondary | ICD-10-CM | POA: Diagnosis not present

## 2019-06-26 ENCOUNTER — Other Ambulatory Visit: Payer: Self-pay | Admitting: Orthopedic Surgery

## 2019-06-26 DIAGNOSIS — Z9889 Other specified postprocedural states: Secondary | ICD-10-CM

## 2019-06-30 ENCOUNTER — Other Ambulatory Visit: Payer: Self-pay | Admitting: Psychiatry

## 2019-06-30 DIAGNOSIS — F331 Major depressive disorder, recurrent, moderate: Secondary | ICD-10-CM

## 2019-06-30 DIAGNOSIS — F411 Generalized anxiety disorder: Secondary | ICD-10-CM

## 2019-07-03 ENCOUNTER — Encounter: Payer: Self-pay | Admitting: Family Medicine

## 2019-07-04 ENCOUNTER — Encounter: Payer: Self-pay | Admitting: Psychiatry

## 2019-07-04 ENCOUNTER — Other Ambulatory Visit: Payer: Self-pay

## 2019-07-04 ENCOUNTER — Ambulatory Visit (INDEPENDENT_AMBULATORY_CARE_PROVIDER_SITE_OTHER): Payer: Medicare HMO | Admitting: Psychiatry

## 2019-07-04 DIAGNOSIS — F5105 Insomnia due to other mental disorder: Secondary | ICD-10-CM | POA: Diagnosis not present

## 2019-07-04 DIAGNOSIS — F411 Generalized anxiety disorder: Secondary | ICD-10-CM

## 2019-07-04 DIAGNOSIS — F3341 Major depressive disorder, recurrent, in partial remission: Secondary | ICD-10-CM | POA: Diagnosis not present
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
## 2019-07-07 ENCOUNTER — Other Ambulatory Visit: Payer: Self-pay

## 2019-07-07 ENCOUNTER — Ambulatory Visit
Admission: RE | Admit: 2019-07-07 | Discharge: 2019-07-07 | Disposition: A | Discharge status: Home / Self Care | Payer: Medicare HMO | Source: Ambulatory Visit | Attending: Orthopedic Surgery | Admitting: Orthopedic Surgery

## 2019-07-07 DIAGNOSIS — Z9889 Other specified postprocedural states: Secondary | ICD-10-CM | POA: Diagnosis not present

## 2019-07-07 DIAGNOSIS — M25511 Pain in right shoulder: Secondary | ICD-10-CM | POA: Diagnosis not present

## 2019-07-13 DIAGNOSIS — M25311 Other instability, right shoulder: Secondary | ICD-10-CM | POA: Diagnosis not present

## 2019-07-17 ENCOUNTER — Other Ambulatory Visit: Payer: Self-pay | Admitting: Orthopedic Surgery

## 2019-07-17 DIAGNOSIS — M25311 Other instability, right shoulder: Secondary | ICD-10-CM

## 2019-07-24 DIAGNOSIS — R200 Anesthesia of skin: Secondary | ICD-10-CM | POA: Diagnosis not present

## 2019-07-25 ENCOUNTER — Other Ambulatory Visit: Payer: Self-pay

## 2019-07-25 ENCOUNTER — Ambulatory Visit
Admission: RE | Admit: 2019-07-25 | Discharge: 2019-07-25 | Disposition: A | Discharge status: Home / Self Care | Payer: Medicare HMO | Source: Ambulatory Visit | Attending: Orthopedic Surgery | Admitting: Orthopedic Surgery

## 2019-07-25 DIAGNOSIS — M25311 Other instability, right shoulder: Secondary | ICD-10-CM | POA: Diagnosis not present

## 2019-07-25 DIAGNOSIS — M19011 Primary osteoarthritis, right shoulder: Secondary | ICD-10-CM | POA: Diagnosis not present

## 2019-07-31 DIAGNOSIS — R200 Anesthesia of skin: Secondary | ICD-10-CM | POA: Insufficient documentation

## 2019-08-03 ENCOUNTER — Other Ambulatory Visit: Payer: Self-pay | Admitting: Psychiatry

## 2019-08-03 DIAGNOSIS — G5603 Carpal tunnel syndrome, bilateral upper limbs: Secondary | ICD-10-CM | POA: Insufficient documentation

## 2019-08-03 DIAGNOSIS — F5105 Insomnia due to other mental disorder: Secondary | ICD-10-CM

## 2019-08-05 ENCOUNTER — Encounter: Payer: Self-pay | Admitting: Family Medicine

## 2019-08-07 ENCOUNTER — Other Ambulatory Visit: Payer: Self-pay

## 2019-08-07 DIAGNOSIS — K219 Gastro-esophageal reflux disease without esophagitis: Secondary | ICD-10-CM

## 2019-08-07 MED ORDER — PANTOPRAZOLE SODIUM 40 MG PO TBEC: 40.0000 mg | DELAYED_RELEASE_TABLET | Freq: Every day | ORAL | 3 refills | Status: DC

## 2019-08-07 NOTE — Progress Notes (Signed)
Changed from pepcid to pantoprazole

## 2019-08-10 ENCOUNTER — Other Ambulatory Visit: Payer: Self-pay

## 2019-08-10 DIAGNOSIS — K219 Gastro-esophageal reflux disease without esophagitis: Secondary | ICD-10-CM

## 2019-08-10 MED ORDER — PANTOPRAZOLE SODIUM 40 MG PO TBEC: 40.0000 mg | DELAYED_RELEASE_TABLET | Freq: Every day | ORAL | 0 refills | Status: AC

## 2019-08-10 NOTE — Progress Notes (Unsigned)
Sent in 90 with 0 refills of pantoprazole

## 2019-08-14 ENCOUNTER — Other Ambulatory Visit: Payer: Self-pay | Admitting: Orthopedic Surgery

## 2019-08-16 ENCOUNTER — Encounter: Admission: RE | Admit: 2019-08-16 | Payer: Medicare HMO | Source: Ambulatory Visit

## 2019-08-17 ENCOUNTER — Other Ambulatory Visit: Admission: RE | Admit: 2019-08-17 | Payer: Medicare HMO | Source: Ambulatory Visit

## 2019-08-17 ENCOUNTER — Other Ambulatory Visit: Payer: Self-pay | Admitting: Orthopedic Surgery

## 2019-08-22 ENCOUNTER — Encounter
Admission: RE | Admit: 2019-08-22 | Discharge: 2019-08-22 | Disposition: A | Discharge status: Home / Self Care | Payer: Medicare HMO | Source: Ambulatory Visit | Attending: Orthopedic Surgery | Admitting: Orthopedic Surgery

## 2019-08-22 ENCOUNTER — Other Ambulatory Visit: Payer: Self-pay

## 2019-08-22 NOTE — Patient Instructions (Addendum)
Your procedure is scheduled on: Mon 2/1 Report to Day Surgery. To find out your arrival time please call [phone] between 1PM - 3PM on Frid 1 /29.  Remember: Instructions that are not followed completely may result in serious medical risk,  up to and including death, or upon the discretion of your surgeon and anesthesiologist your  surgery may need to be rescheduled.     _X__ 1. Do not eat food after midnight the night before your procedure.                 No gum chewing or hard candies. You may drink clear liquids up to 2 hours                 before you are scheduled to arrive for your surgery- DO not drink clear                 liquids within 2 hours of the start of your surgery.                 Clear Liquids include:  water, apple juice without pulp, clear carbohydrate                 drink such as Clearfast of Gatorade, Black Coffee or Tea (Do not add                 anything to coffee or tea).  __X__2.  On the morning of surgery brush your teeth with toothpaste and water, you                may rinse your mouth with mouthwash if you wish.  Do not swallow any toothpaste of mouthwash.     _X__ 3.  No Alcohol for 24 hours before or after surgery.   ___ 4.  Do Not Smoke or use e-cigarettes For 24 Hours Prior to Your Surgery.                 Do not use any chewable tobacco products for at least 6 hours prior to                 surgery.  ____  5.  Bring all medications with you on the day of surgery if instructed.   __x__  6.  Notify your doctor if there is any change in your medical condition      (cold, fever, infections).     Do not wear jewelry, make-up, hairpins, clips or nail polish. Do not wear lotions, powders, or perfumes.  Do not shave 48 hours prior to surgery. Men may shave face and neck. Do not bring valuables to the hospital.    [facility] is not responsible for any belongings or valuables.  Contacts, dentures or bridgework may not be  worn into surgery. Leave your suitcase in the car. After surgery it may be brought to your room. For patients admitted to the hospital, discharge time is determined by your treatment team.   Patients discharged the day of surgery will not be allowed to drive home.   Please read over the following fact sheets that you were given:    __x__ Take these medicines the morning of surgery with A SIP OF WATER:    1. buPROPion (WELLBUTRIN) 100 MG tablet  2. celecoxib (CELEBREX) 100 MG capsule  3. DULoxetine (CYMBALTA)  Usual dose  4.pantoprazole (PROTONIX) 40 MG tablet  Night before and morning of surgery  5.  6.  ____ Fleet Enema (as directed)   __x__ Use CHG Soap as directed  ____ Use inhalers on the day of surgery  ____ Stop metformin 2 days prior to surgery    ____ Take 1/2 of usual insulin dose the night before surgery. No insulin the morning          of surgery.   ____ Stop Coumadin/Plavix/aspirin on   __x__ Stop Anti-inflammatories no Ibuprofen, aleve or aspirin but can continue Celebrex        May take tylenol   __x__ Stop supplements until after surgery.  Melatonin 3 MG TABS  ____ Bring C-Pap to the hospital.

## 2019-08-23 DIAGNOSIS — M25311 Other instability, right shoulder: Secondary | ICD-10-CM | POA: Diagnosis not present

## 2019-08-23 DIAGNOSIS — G5603 Carpal tunnel syndrome, bilateral upper limbs: Secondary | ICD-10-CM | POA: Diagnosis not present

## 2019-08-24 ENCOUNTER — Other Ambulatory Visit: Payer: Medicare HMO

## 2019-08-24 ENCOUNTER — Other Ambulatory Visit: Payer: Self-pay

## 2019-08-24 ENCOUNTER — Encounter
Admission: RE | Admit: 2019-08-24 | Discharge: 2019-08-24 | Disposition: A | Discharge status: Home / Self Care | Payer: Medicare HMO | Source: Ambulatory Visit | Attending: Orthopedic Surgery | Admitting: Orthopedic Surgery

## 2019-08-24 DIAGNOSIS — K219 Gastro-esophageal reflux disease without esophagitis: Secondary | ICD-10-CM | POA: Diagnosis not present

## 2019-08-24 DIAGNOSIS — I10 Essential (primary) hypertension: Secondary | ICD-10-CM | POA: Insufficient documentation

## 2019-08-24 DIAGNOSIS — E039 Hypothyroidism, unspecified: Secondary | ICD-10-CM | POA: Insufficient documentation

## 2019-08-24 DIAGNOSIS — E559 Vitamin D deficiency, unspecified: Secondary | ICD-10-CM | POA: Insufficient documentation

## 2019-08-24 DIAGNOSIS — F329 Major depressive disorder, single episode, unspecified: Secondary | ICD-10-CM | POA: Diagnosis not present

## 2019-08-24 DIAGNOSIS — Z79899 Other long term (current) drug therapy: Secondary | ICD-10-CM | POA: Diagnosis not present

## 2019-08-24 DIAGNOSIS — M353 Polymyalgia rheumatica: Secondary | ICD-10-CM | POA: Diagnosis not present

## 2019-08-24 DIAGNOSIS — Z20822 Contact with and (suspected) exposure to covid-19: Secondary | ICD-10-CM | POA: Insufficient documentation

## 2019-08-24 DIAGNOSIS — Z01810 Encounter for preprocedural cardiovascular examination: Secondary | ICD-10-CM | POA: Diagnosis not present

## 2019-08-24 DIAGNOSIS — Z87891 Personal history of nicotine dependence: Secondary | ICD-10-CM | POA: Insufficient documentation

## 2019-08-24 DIAGNOSIS — G4700 Insomnia, unspecified: Secondary | ICD-10-CM | POA: Diagnosis not present

## 2019-08-24 DIAGNOSIS — Z85828 Personal history of other malignant neoplasm of skin: Secondary | ICD-10-CM | POA: Diagnosis not present

## 2019-08-24 DIAGNOSIS — M19011 Primary osteoarthritis, right shoulder: Secondary | ICD-10-CM | POA: Diagnosis not present

## 2019-08-24 DIAGNOSIS — D509 Iron deficiency anemia, unspecified: Secondary | ICD-10-CM | POA: Diagnosis not present

## 2019-08-24 DIAGNOSIS — Z01812 Encounter for preprocedural laboratory examination: Secondary | ICD-10-CM | POA: Diagnosis not present

## 2019-08-24 DIAGNOSIS — Z79890 Hormone replacement therapy (postmenopausal): Secondary | ICD-10-CM | POA: Insufficient documentation

## 2019-08-24 DIAGNOSIS — M25311 Other instability, right shoulder: Secondary | ICD-10-CM | POA: Insufficient documentation

## 2019-08-24 DIAGNOSIS — M67813 Other specified disorders of tendon, right shoulder: Secondary | ICD-10-CM | POA: Diagnosis not present

## 2019-08-24 DIAGNOSIS — Z9889 Other specified postprocedural states: Secondary | ICD-10-CM | POA: Diagnosis not present

## 2019-08-24 DIAGNOSIS — F419 Anxiety disorder, unspecified: Secondary | ICD-10-CM | POA: Diagnosis not present

## 2019-08-24 DIAGNOSIS — Z96643 Presence of artificial hip joint, bilateral: Secondary | ICD-10-CM | POA: Diagnosis not present

## 2019-08-24 DIAGNOSIS — M75121 Complete rotator cuff tear or rupture of right shoulder, not specified as traumatic: Secondary | ICD-10-CM | POA: Diagnosis not present

## 2019-08-24 LAB — CBC WITH DIFFERENTIAL/PLATELET
Abs Immature Granulocytes: 0.05 10*3/uL (ref 0.00–0.07)
Basophils Absolute: 0.1 10*3/uL (ref 0.0–0.1)
Basophils Relative: 1 %
Eosinophils Absolute: 0.2 10*3/uL (ref 0.0–0.5)
Eosinophils Relative: 2 %
HCT: 39 % (ref 36.0–46.0)
Hemoglobin: 12.5 g/dL (ref 12.0–15.0)
Immature Granulocytes: 1 %
Lymphocytes Relative: 28 %
Lymphs Abs: 2.3 10*3/uL (ref 0.7–4.0)
MCH: 31.4 pg (ref 26.0–34.0)
MCHC: 32.1 g/dL (ref 30.0–36.0)
MCV: 98 fL (ref 80.0–100.0)
Monocytes Absolute: 0.4 10*3/uL (ref 0.1–1.0)
Monocytes Relative: 5 %
Neutro Abs: 5.3 10*3/uL (ref 1.7–7.7)
Neutrophils Relative %: 63 %
Platelets: 265 10*3/uL (ref 150–400)
RBC: 3.98 MIL/uL (ref 3.87–5.11)
RDW: 13.8 % (ref 11.5–15.5)
WBC: 8.2 10*3/uL (ref 4.0–10.5)
nRBC: 0 % (ref 0.0–0.2)

## 2019-08-24 LAB — COMPREHENSIVE METABOLIC PANEL
ALT: 21 U/L (ref 0–44)
AST: 26 U/L (ref 15–41)
Albumin: 4.2 g/dL (ref 3.5–5.0)
Alkaline Phosphatase: 122 U/L (ref 38–126)
Anion gap: 9 (ref 5–15)
BUN: 24 mg/dL — ABNORMAL HIGH (ref 8–23)
CO2: 26 mmol/L (ref 22–32)
Calcium: 9 mg/dL (ref 8.9–10.3)
Chloride: 104 mmol/L (ref 98–111)
Creatinine, Ser: 0.81 mg/dL (ref 0.44–1.00)
GFR calc Af Amer: >60 mL/min (ref >60)
GFR calc non Af Amer: >60 mL/min (ref >60)
Glucose, Bld: 85 mg/dL (ref 70–99)
Potassium: 4.1 mmol/L (ref 3.5–5.1)
Sodium: 139 mmol/L (ref 135–145)
Total Bilirubin: 0.7 mg/dL (ref 0.3–1.2)
Total Protein: 7.1 g/dL (ref 6.5–8.1)

## 2019-08-24 LAB — URINALYSIS, ROUTINE W REFLEX MICROSCOPIC
Bilirubin Urine: NEGATIVE
Glucose, UA: NEGATIVE mg/dL
Hgb urine dipstick: NEGATIVE
Ketones, ur: NEGATIVE mg/dL
Nitrite: POSITIVE — AB
Protein, ur: NEGATIVE mg/dL
Specific Gravity, Urine: 1.016 (ref 1.005–1.030)
pH: 5 (ref 5.0–8.0)

## 2019-08-24 LAB — TYPE AND SCREEN
ABO/RH(D): A POS
Antibody Screen: NEGATIVE

## 2019-08-24 LAB — SURGICAL PCR SCREEN
MRSA, PCR: NEGATIVE
Staphylococcus aureus: NEGATIVE

## 2019-08-24 LAB — SARS CORONAVIRUS 2 (TAT 6-24 HRS): SARS Coronavirus 2: NEGATIVE

## 2019-08-24 LAB — PROTIME-INR
INR: 0.8 (ref 0.8–1.2)
Prothrombin Time: 11.4 seconds (ref 11.4–15.2)

## 2019-08-24 LAB — APTT: aPTT: 33 seconds (ref 24–36)

## 2019-08-24 NOTE — Pre-Procedure Instructions (Signed)
Patel aware of UA results via secure chat. He will be calling in RX for pt.

## 2019-08-25 NOTE — Pre-Procedure Instructions (Signed)
Urine culture results sent to Dr. Posey Pronto for review.

## 2019-08-26 LAB — URINE CULTURE: Culture: >=100000 — AB

## 2019-08-28 ENCOUNTER — Encounter
Admission: RE | Disposition: A | Discharge status: Home / Self Care | Payer: Self-pay | Source: Home / Self Care | Attending: Orthopedic Surgery

## 2019-08-28 ENCOUNTER — Other Ambulatory Visit: Payer: Self-pay

## 2019-08-28 ENCOUNTER — Ambulatory Visit: Payer: Medicare HMO

## 2019-08-28 ENCOUNTER — Ambulatory Visit: Payer: Medicare HMO | Admitting: Certified Registered"

## 2019-08-28 ENCOUNTER — Other Ambulatory Visit: Payer: Self-pay | Admitting: Psychiatry

## 2019-08-28 ENCOUNTER — Observation Stay
Admission: RE | Admit: 2019-08-28 | Discharge: 2019-08-29 | Disposition: A | Discharge status: Home / Self Care | Payer: Medicare HMO | Attending: Orthopedic Surgery | Admitting: Orthopedic Surgery

## 2019-08-28 ENCOUNTER — Encounter: Payer: Self-pay | Admitting: Orthopedic Surgery

## 2019-08-28 DIAGNOSIS — G4700 Insomnia, unspecified: Secondary | ICD-10-CM | POA: Diagnosis not present

## 2019-08-28 DIAGNOSIS — F329 Major depressive disorder, single episode, unspecified: Secondary | ICD-10-CM | POA: Diagnosis not present

## 2019-08-28 DIAGNOSIS — M353 Polymyalgia rheumatica: Secondary | ICD-10-CM | POA: Diagnosis not present

## 2019-08-28 DIAGNOSIS — I129 Hypertensive chronic kidney disease with stage 1 through stage 4 chronic kidney disease, or unspecified chronic kidney disease: Secondary | ICD-10-CM | POA: Diagnosis not present

## 2019-08-28 DIAGNOSIS — K219 Gastro-esophageal reflux disease without esophagitis: Secondary | ICD-10-CM | POA: Diagnosis not present

## 2019-08-28 DIAGNOSIS — I10 Essential (primary) hypertension: Secondary | ICD-10-CM | POA: Insufficient documentation

## 2019-08-28 DIAGNOSIS — M75121 Complete rotator cuff tear or rupture of right shoulder, not specified as traumatic: Secondary | ICD-10-CM | POA: Diagnosis not present

## 2019-08-28 DIAGNOSIS — D509 Iron deficiency anemia, unspecified: Secondary | ICD-10-CM | POA: Insufficient documentation

## 2019-08-28 DIAGNOSIS — F419 Anxiety disorder, unspecified: Secondary | ICD-10-CM | POA: Insufficient documentation

## 2019-08-28 DIAGNOSIS — Z96619 Presence of unspecified artificial shoulder joint: Secondary | ICD-10-CM

## 2019-08-28 DIAGNOSIS — Z96611 Presence of right artificial shoulder joint: Secondary | ICD-10-CM | POA: Diagnosis not present

## 2019-08-28 DIAGNOSIS — Z419 Encounter for procedure for purposes other than remedying health state, unspecified: Secondary | ICD-10-CM

## 2019-08-28 DIAGNOSIS — E039 Hypothyroidism, unspecified: Secondary | ICD-10-CM | POA: Insufficient documentation

## 2019-08-28 DIAGNOSIS — M75101 Unspecified rotator cuff tear or rupture of right shoulder, not specified as traumatic: Secondary | ICD-10-CM | POA: Diagnosis not present

## 2019-08-28 DIAGNOSIS — Z471 Aftercare following joint replacement surgery: Secondary | ICD-10-CM | POA: Diagnosis not present

## 2019-08-28 DIAGNOSIS — F331 Major depressive disorder, recurrent, moderate: Secondary | ICD-10-CM

## 2019-08-28 DIAGNOSIS — M19011 Primary osteoarthritis, right shoulder: Secondary | ICD-10-CM | POA: Diagnosis not present

## 2019-08-28 DIAGNOSIS — M25511 Pain in right shoulder: Secondary | ICD-10-CM | POA: Diagnosis not present

## 2019-08-28 DIAGNOSIS — Z96643 Presence of artificial hip joint, bilateral: Secondary | ICD-10-CM | POA: Insufficient documentation

## 2019-08-28 DIAGNOSIS — M25311 Other instability, right shoulder: Secondary | ICD-10-CM | POA: Diagnosis not present

## 2019-08-28 DIAGNOSIS — M7521 Bicipital tendinitis, right shoulder: Secondary | ICD-10-CM | POA: Diagnosis not present

## 2019-08-28 DIAGNOSIS — N189 Chronic kidney disease, unspecified: Secondary | ICD-10-CM | POA: Diagnosis not present

## 2019-08-28 DIAGNOSIS — M67813 Other specified disorders of tendon, right shoulder: Secondary | ICD-10-CM | POA: Insufficient documentation

## 2019-08-28 DIAGNOSIS — G8918 Other acute postprocedural pain: Secondary | ICD-10-CM | POA: Diagnosis not present

## 2019-08-28 DIAGNOSIS — Z79899 Other long term (current) drug therapy: Secondary | ICD-10-CM | POA: Insufficient documentation

## 2019-08-28 DIAGNOSIS — J449 Chronic obstructive pulmonary disease, unspecified: Secondary | ICD-10-CM | POA: Diagnosis not present

## 2019-08-28 DIAGNOSIS — Z9889 Other specified postprocedural states: Secondary | ICD-10-CM | POA: Insufficient documentation

## 2019-08-28 DIAGNOSIS — Z87891 Personal history of nicotine dependence: Secondary | ICD-10-CM | POA: Insufficient documentation

## 2019-08-28 DIAGNOSIS — Z85828 Personal history of other malignant neoplasm of skin: Secondary | ICD-10-CM | POA: Insufficient documentation

## 2019-08-28 HISTORY — PX: REVERSE SHOULDER ARTHROPLASTY: SHX5054

## 2019-08-28 LAB — CBC
HCT: 39 % (ref 36.0–46.0)
Hemoglobin: 12.5 g/dL (ref 12.0–15.0)
MCH: 31.8 pg (ref 26.0–34.0)
MCHC: 32.1 g/dL (ref 30.0–36.0)
MCV: 99.2 fL (ref 80.0–100.0)
Platelets: 270 10*3/uL (ref 150–400)
RBC: 3.93 MIL/uL (ref 3.87–5.11)
RDW: 13.7 % (ref 11.5–15.5)
WBC: 10.5 10*3/uL (ref 4.0–10.5)
nRBC: 0 % (ref 0.0–0.2)

## 2019-08-28 LAB — ABO/RH: ABO/RH(D): A POS

## 2019-08-28 SURGERY — ARTHROPLASTY, SHOULDER, TOTAL, REVERSE
Anesthesia: General | Site: Shoulder | Laterality: Right

## 2019-08-28 MED ORDER — SENNOSIDES-DOCUSATE SODIUM 8.6-50 MG PO TABS: 1.0000 | ORAL_TABLET | Freq: Every evening | ORAL | Status: DC | PRN

## 2019-08-28 MED ORDER — MIDAZOLAM HCL 2 MG/2ML IJ SOLN
INTRAMUSCULAR | Status: AC
  Administered 2019-08-28: 1 mg via INTRAVENOUS
  Filled 2019-08-28: qty 2

## 2019-08-28 MED ORDER — ONDANSETRON 4 MG PO TBDP: 4.0000 mg | ORAL_TABLET | Freq: Three times a day (TID) | ORAL | 0 refills | Status: DC | PRN

## 2019-08-28 MED ORDER — LISINOPRIL 20 MG PO TABS
20.0000 mg | ORAL_TABLET | Freq: Every day | ORAL | Status: DC
  Administered 2019-08-29: 20 mg via ORAL
  Filled 2019-08-28: qty 1

## 2019-08-28 MED ORDER — NEOMYCIN-POLYMYXIN B GU 40-200000 IR SOLN
Status: AC
  Filled 2019-08-28: qty 20

## 2019-08-28 MED ORDER — FENTANYL CITRATE (PF) 100 MCG/2ML IJ SOLN
INTRAMUSCULAR | Status: DC | PRN
  Administered 2019-08-28 (×2): 50 ug via INTRAVENOUS

## 2019-08-28 MED ORDER — CLINDAMYCIN PHOSPHATE 900 MG/50ML IV SOLN
INTRAVENOUS | Status: AC
  Administered 2019-08-28: 900 mg via INTRAVENOUS
  Filled 2019-08-28: qty 50

## 2019-08-28 MED ORDER — METOCLOPRAMIDE HCL 10 MG PO TABS: 5.0000 mg | ORAL_TABLET | Freq: Three times a day (TID) | ORAL | Status: DC | PRN

## 2019-08-28 MED ORDER — SUGAMMADEX SODIUM 500 MG/5ML IV SOLN
INTRAVENOUS | Status: DC | PRN
  Administered 2019-08-28: 400 mg via INTRAVENOUS

## 2019-08-28 MED ORDER — GLYCOPYRROLATE 0.2 MG/ML IJ SOLN
INTRAMUSCULAR | Status: AC
  Filled 2019-08-28: qty 1

## 2019-08-28 MED ORDER — ESMOLOL HCL 100 MG/10ML IV SOLN
INTRAVENOUS | Status: DC | PRN
  Administered 2019-08-28: 30 ug via INTRAVENOUS

## 2019-08-28 MED ORDER — CLINDAMYCIN PHOSPHATE 900 MG/50ML IV SOLN
INTRAVENOUS | Status: AC
  Filled 2019-08-28: qty 50

## 2019-08-28 MED ORDER — GLYCOPYRROLATE 0.2 MG/ML IJ SOLN
INTRAMUSCULAR | Status: DC | PRN
  Administered 2019-08-28: .2 mg via INTRAVENOUS

## 2019-08-28 MED ORDER — MENTHOL 3 MG MT LOZG
1.0000 | LOZENGE | OROMUCOSAL | Status: DC | PRN
  Filled 2019-08-28: qty 9

## 2019-08-28 MED ORDER — ASPIRIN EC 325 MG PO TBEC
325.0000 mg | DELAYED_RELEASE_TABLET | Freq: Every day | ORAL | Status: DC
  Administered 2019-08-29: 325 mg via ORAL
  Filled 2019-08-28: qty 1

## 2019-08-28 MED ORDER — ROCURONIUM BROMIDE 50 MG/5ML IV SOLN
INTRAVENOUS | Status: AC
  Filled 2019-08-28: qty 2

## 2019-08-28 MED ORDER — BISACODYL 10 MG RE SUPP: 10.0000 mg | Freq: Every day | RECTAL | Status: DC | PRN

## 2019-08-28 MED ORDER — MIDAZOLAM HCL 2 MG/2ML IJ SOLN
INTRAMUSCULAR | Status: AC
  Filled 2019-08-28: qty 2

## 2019-08-28 MED ORDER — CHLORHEXIDINE GLUCONATE 4 % EX LIQD: 60.0000 mL | Freq: Once | CUTANEOUS | Status: DC

## 2019-08-28 MED ORDER — REMIFENTANIL HCL 1 MG IV SOLR
INTRAVENOUS | Status: AC
  Filled 2019-08-28: qty 1000

## 2019-08-28 MED ORDER — PANTOPRAZOLE SODIUM 40 MG PO TBEC: 40.0000 mg | DELAYED_RELEASE_TABLET | Freq: Every day | ORAL | Status: DC

## 2019-08-28 MED ORDER — METOCLOPRAMIDE HCL 5 MG/ML IJ SOLN: 5.0000 mg | Freq: Three times a day (TID) | INTRAMUSCULAR | Status: DC | PRN

## 2019-08-28 MED ORDER — MELATONIN 5 MG PO TABS
5.0000 mg | ORAL_TABLET | Freq: Every day | ORAL | Status: DC
  Administered 2019-08-28: 5 mg via ORAL
  Filled 2019-08-28: qty 1

## 2019-08-28 MED ORDER — EPHEDRINE SULFATE 50 MG/ML IJ SOLN
INTRAMUSCULAR | Status: AC
  Filled 2019-08-28: qty 2

## 2019-08-28 MED ORDER — FENTANYL CITRATE (PF) 100 MCG/2ML IJ SOLN
INTRAMUSCULAR | Status: AC
  Administered 2019-08-28: 50 ug via INTRAVENOUS
  Filled 2019-08-28: qty 2

## 2019-08-28 MED ORDER — CLINDAMYCIN PHOSPHATE 900 MG/50ML IV SOLN: 900.0000 mg | Freq: Once | INTRAVENOUS | Status: AC

## 2019-08-28 MED ORDER — ASPIRIN EC 325 MG PO TBEC: 325.0000 mg | DELAYED_RELEASE_TABLET | Freq: Every day | ORAL | 0 refills | Status: AC

## 2019-08-28 MED ORDER — HYDROMORPHONE HCL 1 MG/ML IJ SOLN: 0.2000 mg | INTRAMUSCULAR | Status: DC | PRN

## 2019-08-28 MED ORDER — FENTANYL CITRATE (PF) 100 MCG/2ML IJ SOLN
INTRAMUSCULAR | Status: AC
  Filled 2019-08-28: qty 2

## 2019-08-28 MED ORDER — ALUM & MAG HYDROXIDE-SIMETH 200-200-20 MG/5ML PO SUSP: 30.0000 mL | ORAL | Status: DC | PRN

## 2019-08-28 MED ORDER — LACTATED RINGERS IV SOLN: INTRAVENOUS | Status: DC

## 2019-08-28 MED ORDER — FENTANYL CITRATE (PF) 100 MCG/2ML IJ SOLN: 25.0000 ug | INTRAMUSCULAR | Status: DC | PRN

## 2019-08-28 MED ORDER — FENTANYL CITRATE (PF) 100 MCG/2ML IJ SOLN: 50.0000 ug | Freq: Once | INTRAMUSCULAR | Status: AC

## 2019-08-28 MED ORDER — LIDOCAINE HCL (PF) 2 % IJ SOLN
INTRAMUSCULAR | Status: AC
  Filled 2019-08-28: qty 10

## 2019-08-28 MED ORDER — BUPIVACAINE LIPOSOME 1.3 % IJ SUSP
INTRAMUSCULAR | Status: AC
  Filled 2019-08-28: qty 20

## 2019-08-28 MED ORDER — ONDANSETRON HCL 4 MG/2ML IJ SOLN
INTRAMUSCULAR | Status: DC | PRN
  Administered 2019-08-28 (×2): 4 mg via INTRAVENOUS

## 2019-08-28 MED ORDER — CLINDAMYCIN PHOSPHATE 900 MG/50ML IV SOLN
900.0000 mg | INTRAVENOUS | Status: AC
  Administered 2019-08-28: 900 mg via INTRAVENOUS

## 2019-08-28 MED ORDER — AMITRIPTYLINE HCL 25 MG PO TABS
25.0000 mg | ORAL_TABLET | Freq: Every day | ORAL | Status: DC
  Administered 2019-08-28: 25 mg via ORAL
  Filled 2019-08-28: qty 1

## 2019-08-28 MED ORDER — PHENYLEPHRINE HCL (PRESSORS) 10 MG/ML IV SOLN
INTRAVENOUS | Status: AC
  Filled 2019-08-28: qty 1

## 2019-08-28 MED ORDER — SUCCINYLCHOLINE CHLORIDE 20 MG/ML IJ SOLN
INTRAMUSCULAR | Status: AC
  Filled 2019-08-28: qty 1

## 2019-08-28 MED ORDER — ACETAMINOPHEN 500 MG PO TABS
ORAL_TABLET | ORAL | Status: AC
  Filled 2019-08-28: qty 2

## 2019-08-28 MED ORDER — EPHEDRINE SULFATE 50 MG/ML IJ SOLN
INTRAMUSCULAR | Status: DC | PRN
  Administered 2019-08-28 (×5): 5 mg via INTRAVENOUS

## 2019-08-28 MED ORDER — SODIUM CHLORIDE 0.9 % IV SOLN: INTRAVENOUS | Status: DC

## 2019-08-28 MED ORDER — ACETAMINOPHEN 500 MG PO TABS
1000.0000 mg | ORAL_TABLET | Freq: Three times a day (TID) | ORAL | Status: DC
  Administered 2019-08-28 – 2019-08-29 (×2): 1000 mg via ORAL
  Filled 2019-08-28 (×2): qty 2

## 2019-08-28 MED ORDER — CLINDAMYCIN PHOSPHATE 900 MG/50ML IV SOLN
900.0000 mg | Freq: Four times a day (QID) | INTRAVENOUS | Status: AC
  Administered 2019-08-28 (×2): 900 mg via INTRAVENOUS
  Filled 2019-08-28: qty 50

## 2019-08-28 MED ORDER — ONDANSETRON HCL 4 MG/2ML IJ SOLN: 4.0000 mg | Freq: Four times a day (QID) | INTRAMUSCULAR | Status: DC | PRN

## 2019-08-28 MED ORDER — METHOCARBAMOL 500 MG PO TABS: 500.0000 mg | ORAL_TABLET | Freq: Four times a day (QID) | ORAL | Status: DC | PRN

## 2019-08-28 MED ORDER — ALBUMIN HUMAN 5 % IV SOLN
INTRAVENOUS | Status: AC
  Filled 2019-08-28: qty 250

## 2019-08-28 MED ORDER — PROMETHAZINE HCL 25 MG/ML IJ SOLN: 6.2500 mg | INTRAMUSCULAR | Status: DC | PRN

## 2019-08-28 MED ORDER — TRANEXAMIC ACID 1000 MG/10ML IV SOLN
INTRAVENOUS | Status: DC | PRN
  Administered 2019-08-28: 08:00:00 1000 mg via INTRAVENOUS

## 2019-08-28 MED ORDER — DULOXETINE HCL 60 MG PO CPEP
60.0000 mg | ORAL_CAPSULE | Freq: Every day | ORAL | Status: DC
  Administered 2019-08-29: 60 mg via ORAL
  Filled 2019-08-28: qty 1

## 2019-08-28 MED ORDER — LIDOCAINE HCL (CARDIAC) PF 100 MG/5ML IV SOSY
PREFILLED_SYRINGE | INTRAVENOUS | Status: DC | PRN
  Administered 2019-08-28: 60 mg via INTRAVENOUS

## 2019-08-28 MED ORDER — ALBUMIN HUMAN 5 % IV SOLN: INTRAVENOUS | Status: DC | PRN

## 2019-08-28 MED ORDER — OXYCODONE HCL 5 MG PO TABS
10.0000 mg | ORAL_TABLET | ORAL | Status: DC | PRN
  Administered 2019-08-29: 15 mg via ORAL
  Filled 2019-08-28: qty 3

## 2019-08-28 MED ORDER — OXYCODONE HCL 5 MG PO TABS
5.0000 mg | ORAL_TABLET | ORAL | Status: DC | PRN
  Administered 2019-08-28: 10 mg via ORAL
  Administered 2019-08-28: 5 mg via ORAL
  Administered 2019-08-29: 10 mg via ORAL
  Filled 2019-08-28 (×2): qty 2

## 2019-08-28 MED ORDER — LIDOCAINE HCL (PF) 1 % IJ SOLN
INTRAMUSCULAR | Status: AC
  Filled 2019-08-28: qty 5

## 2019-08-28 MED ORDER — CHLORHEXIDINE GLUCONATE CLOTH 2 % EX PADS
6.0000 | MEDICATED_PAD | Freq: Every day | CUTANEOUS | Status: DC
  Administered 2019-08-28: 6 via TOPICAL

## 2019-08-28 MED ORDER — ACETAMINOPHEN 10 MG/ML IV SOLN
INTRAVENOUS | Status: AC
  Filled 2019-08-28: qty 200

## 2019-08-28 MED ORDER — DULOXETINE HCL 30 MG PO CPEP
30.0000 mg | ORAL_CAPSULE | Freq: Every day | ORAL | Status: DC
  Administered 2019-08-29: 30 mg via ORAL
  Filled 2019-08-28: qty 1

## 2019-08-28 MED ORDER — VANCOMYCIN HCL 1000 MG IV SOLR
INTRAVENOUS | Status: AC
  Filled 2019-08-28: qty 1000

## 2019-08-28 MED ORDER — PROPOFOL 10 MG/ML IV BOLUS
INTRAVENOUS | Status: DC | PRN
Start: 2019-08-28 — End: 2019-08-28
  Administered 2019-08-28: 150 mg via INTRAVENOUS

## 2019-08-28 MED ORDER — REMIFENTANIL HCL 1 MG IV SOLR
INTRAVENOUS | Status: DC | PRN
  Administered 2019-08-28: .1 ug/kg/min via INTRAVENOUS

## 2019-08-28 MED ORDER — LIDOCAINE HCL (PF) 1 % IJ SOLN
INTRAMUSCULAR | Status: DC | PRN
  Administered 2019-08-28: 5 mL via SUBCUTANEOUS

## 2019-08-28 MED ORDER — DOCUSATE SODIUM 100 MG PO CAPS
100.0000 mg | ORAL_CAPSULE | Freq: Two times a day (BID) | ORAL | Status: DC
  Administered 2019-08-29: 100 mg via ORAL
  Filled 2019-08-28 (×2): qty 1

## 2019-08-28 MED ORDER — ROCURONIUM BROMIDE 100 MG/10ML IV SOLN
INTRAVENOUS | Status: DC | PRN
  Administered 2019-08-28: 40 mg via INTRAVENOUS
  Administered 2019-08-28: 30 mg via INTRAVENOUS
  Administered 2019-08-28: 50 mg via INTRAVENOUS

## 2019-08-28 MED ORDER — PHENOL 1.4 % MT LIQD
1.0000 | OROMUCOSAL | Status: DC | PRN
  Filled 2019-08-28: qty 177

## 2019-08-28 MED ORDER — LEVOTHYROXINE SODIUM 100 MCG PO TABS
200.0000 ug | ORAL_TABLET | Freq: Every evening | ORAL | Status: DC
  Administered 2019-08-28: 200 ug via ORAL
  Filled 2019-08-28: qty 2

## 2019-08-28 MED ORDER — NEOMYCIN-POLYMYXIN B GU 40-200000 IR SOLN
Status: DC | PRN
  Administered 2019-08-28: 16 mL

## 2019-08-28 MED ORDER — ACETAMINOPHEN 500 MG PO TABS
1000.0000 mg | ORAL_TABLET | Freq: Three times a day (TID) | ORAL | Status: DC
  Administered 2019-08-28: 1000 mg via ORAL

## 2019-08-28 MED ORDER — BUPIVACAINE HCL (PF) 0.5 % IJ SOLN
INTRAMUSCULAR | Status: AC
  Filled 2019-08-28: qty 10

## 2019-08-28 MED ORDER — BUPIVACAINE LIPOSOME 1.3 % IJ SUSP
INTRAMUSCULAR | Status: DC | PRN
  Administered 2019-08-28: 20 mL via PERINEURAL

## 2019-08-28 MED ORDER — PROPOFOL 10 MG/ML IV BOLUS
INTRAVENOUS | Status: AC
  Filled 2019-08-28: qty 20

## 2019-08-28 MED ORDER — KETOROLAC TROMETHAMINE 30 MG/ML IJ SOLN
INTRAMUSCULAR | Status: AC
  Filled 2019-08-28: qty 1

## 2019-08-28 MED ORDER — MAGNESIUM OXIDE 400 (241.3 MG) MG PO TABS
200.0000 mg | ORAL_TABLET | Freq: Every day | ORAL | Status: DC
  Administered 2019-08-29: 200 mg via ORAL
  Filled 2019-08-28: qty 1

## 2019-08-28 MED ORDER — SODIUM CHLORIDE 0.9 % IV SOLN
INTRAVENOUS | Status: DC | PRN
  Administered 2019-08-28: 30 ug/min via INTRAVENOUS

## 2019-08-28 MED ORDER — PHENYLEPHRINE HCL (PRESSORS) 10 MG/ML IV SOLN
INTRAVENOUS | Status: DC | PRN
  Administered 2019-08-28 (×4): 100 ug via INTRAVENOUS
  Administered 2019-08-28: 200 ug via INTRAVENOUS

## 2019-08-28 MED ORDER — ACETAMINOPHEN 500 MG PO TABS: 1000.0000 mg | ORAL_TABLET | Freq: Three times a day (TID) | ORAL | 2 refills | Status: AC

## 2019-08-28 MED ORDER — ZOLPIDEM TARTRATE 5 MG PO TABS
5.0000 mg | ORAL_TABLET | Freq: Once | ORAL | Status: AC
  Administered 2019-08-29: 5 mg via ORAL
  Filled 2019-08-28: qty 1

## 2019-08-28 MED ORDER — ZOLPIDEM TARTRATE 5 MG PO TABS
5.0000 mg | ORAL_TABLET | Freq: Every day | ORAL | Status: DC
  Administered 2019-08-28: 5 mg via ORAL
  Filled 2019-08-28: qty 1

## 2019-08-28 MED ORDER — MIDAZOLAM HCL 2 MG/2ML IJ SOLN: 1.0000 mg | Freq: Once | INTRAMUSCULAR | Status: AC

## 2019-08-28 MED ORDER — TRANEXAMIC ACID 1000 MG/10ML IV SOLN
INTRAVENOUS | Status: AC
  Filled 2019-08-28: qty 10

## 2019-08-28 MED ORDER — TRANEXAMIC ACID-NACL 1000-0.7 MG/100ML-% IV SOLN
1000.0000 mg | Freq: Once | INTRAVENOUS | Status: AC
  Administered 2019-08-28: 1000 mg via INTRAVENOUS

## 2019-08-28 MED ORDER — ONDANSETRON HCL 4 MG/2ML IJ SOLN
INTRAMUSCULAR | Status: AC
  Filled 2019-08-28: qty 4

## 2019-08-28 MED ORDER — BUPIVACAINE HCL (PF) 0.5 % IJ SOLN
INTRAMUSCULAR | Status: DC | PRN
  Administered 2019-08-28: 10 mL via PERINEURAL

## 2019-08-28 MED ORDER — SUGAMMADEX SODIUM 500 MG/5ML IV SOLN
INTRAVENOUS | Status: AC
  Filled 2019-08-28: qty 5

## 2019-08-28 MED ORDER — ONDANSETRON HCL 4 MG PO TABS: 4.0000 mg | ORAL_TABLET | Freq: Four times a day (QID) | ORAL | Status: DC | PRN

## 2019-08-28 MED ORDER — TRANEXAMIC ACID-NACL 1000-0.7 MG/100ML-% IV SOLN
INTRAVENOUS | Status: AC
  Filled 2019-08-28: qty 100

## 2019-08-28 MED ORDER — CALCIUM CARBONATE-VITAMIN D 500-200 MG-UNIT PO TABS
1.0000 | ORAL_TABLET | Freq: Every day | ORAL | Status: DC
  Administered 2019-08-29: 1 via ORAL
  Filled 2019-08-28: qty 1

## 2019-08-28 MED ORDER — DEXAMETHASONE SODIUM PHOSPHATE 10 MG/ML IJ SOLN
INTRAMUSCULAR | Status: DC | PRN
Start: 2019-08-28 — End: 2019-08-28
  Administered 2019-08-28: 10 mg via INTRAVENOUS

## 2019-08-28 MED ORDER — CELECOXIB 100 MG PO CAPS
100.0000 mg | ORAL_CAPSULE | Freq: Every day | ORAL | Status: DC
  Administered 2019-08-29 (×2): 100 mg via ORAL
  Filled 2019-08-28 (×2): qty 1

## 2019-08-28 MED ORDER — OXYCODONE HCL 5 MG PO TABS
ORAL_TABLET | ORAL | Status: AC
  Filled 2019-08-28: qty 1

## 2019-08-28 MED ORDER — BUPROPION HCL 100 MG PO TABS
100.0000 mg | ORAL_TABLET | Freq: Every morning | ORAL | Status: DC
  Administered 2019-08-29: 100 mg via ORAL
  Filled 2019-08-28: qty 1

## 2019-08-28 MED ORDER — METHOCARBAMOL 1000 MG/10ML IJ SOLN
500.0000 mg | Freq: Four times a day (QID) | INTRAVENOUS | Status: DC | PRN
  Filled 2019-08-28: qty 5

## 2019-08-28 MED ORDER — OXYCODONE HCL 5 MG PO TABS: 5.0000 mg | ORAL_TABLET | ORAL | 0 refills | Status: DC | PRN

## 2019-08-28 SURGICAL SUPPLY — 87 items
BASEPLATE P2 COATD GLND 6.5X30 (Shoulder) ×1 IMPLANT
BIT DRILL 4 DIA CALIBRATED (BIT) ×3 IMPLANT
BIT DRILL GUIDE PATIENT MATCH (MISCELLANEOUS) ×1 IMPLANT
BLADE SAGITTAL WIDE XTHICK NO (BLADE) ×3 IMPLANT
CANISTER SUCT 1200ML W/VALVE (MISCELLANEOUS) IMPLANT
CANISTER SUCT 3000ML PPV (MISCELLANEOUS) IMPLANT
CHLORAPREP W/TINT 26 (MISCELLANEOUS) ×3 IMPLANT
CLOSURE WOUND 1/2 X4 (GAUZE/BANDAGES/DRESSINGS)
CNTNR SPEC 2.5X3XGRAD LEK (MISCELLANEOUS) ×1
CONT SPEC 4OZ STER OR WHT (MISCELLANEOUS) ×2
CONTAINER SPEC 2.5X3XGRAD LEK (MISCELLANEOUS) ×1 IMPLANT
COOLER POLAR GLACIER W/PUMP (MISCELLANEOUS) ×3 IMPLANT
COVER BACK TABLE REUSABLE LG (DRAPES) ×3 IMPLANT
COVER WAND RF STERILE (DRAPES) ×3 IMPLANT
DERMABOND ADVANCED (GAUZE/BANDAGES/DRESSINGS) ×2
DERMABOND ADVANCED .7 DNX12 (GAUZE/BANDAGES/DRESSINGS) ×1 IMPLANT
DRAPE 3/4 80X56 (DRAPES) ×6 IMPLANT
DRAPE INCISE IOBAN 66X45 STRL (DRAPES) ×6 IMPLANT
DRAPE SPLIT 6X30 W/TAPE (DRAPES) ×6 IMPLANT
DRAPE U-SHAPE 47X51 STRL (DRAPES) ×3 IMPLANT
DRILL GUIDE PATIENT MATCH (MISCELLANEOUS) ×3
DRSG OPSITE POSTOP 3X4 (GAUZE/BANDAGES/DRESSINGS) ×3 IMPLANT
DRSG OPSITE POSTOP 4X6 (GAUZE/BANDAGES/DRESSINGS) ×3 IMPLANT
DRSG OPSITE POSTOP 4X8 (GAUZE/BANDAGES/DRESSINGS) IMPLANT
DRSG TEGADERM 2-3/8X2-3/4 SM (GAUZE/BANDAGES/DRESSINGS) IMPLANT
DRSG TEGADERM 4X10 (GAUZE/BANDAGES/DRESSINGS) ×9 IMPLANT
DRSG TEGADERM 4X4.75 (GAUZE/BANDAGES/DRESSINGS) ×6 IMPLANT
ELECT BLADE 6.5 EXT (BLADE) IMPLANT
ELECT CAUTERY BLADE 6.4 (BLADE) IMPLANT
ELECT REM PT RETURN 9FT ADLT (ELECTROSURGICAL) ×3
ELECTRODE REM PT RTRN 9FT ADLT (ELECTROSURGICAL) ×1 IMPLANT
GAUZE SPONGE 4X4 12PLY STRL (GAUZE/BANDAGES/DRESSINGS) ×3 IMPLANT
GAUZE XEROFORM 1X8 LF (GAUZE/BANDAGES/DRESSINGS) IMPLANT
GLOVE BIOGEL PI IND STRL 8 (GLOVE) ×2 IMPLANT
GLOVE BIOGEL PI INDICATOR 8 (GLOVE) ×4
GLOVE SURG ORTHO 8.0 STRL STRW (GLOVE) ×6 IMPLANT
GOWN STRL REUS W/ TWL LRG LVL3 (GOWN DISPOSABLE) ×2 IMPLANT
GOWN STRL REUS W/ TWL XL LVL3 (GOWN DISPOSABLE) ×1 IMPLANT
GOWN STRL REUS W/TWL LRG LVL3 (GOWN DISPOSABLE) ×4
GOWN STRL REUS W/TWL XL LVL3 (GOWN DISPOSABLE) ×2
HEMOVAC 400CC 10FR (MISCELLANEOUS) IMPLANT
HOOD PEEL AWAY FLYTE STAYCOOL (MISCELLANEOUS) ×9 IMPLANT
ILLUMINATOR WAVEGUIDE N/F (MISCELLANEOUS) ×3 IMPLANT
INSERT SMALL SOCKET 32MM (Insert) ×3 IMPLANT
KIT STABILIZATION SHOULDER (MISCELLANEOUS) ×3 IMPLANT
MASK FACE SPIDER DISP (MASK) ×3 IMPLANT
MAT ABSORB  FLUID 56X50 GRAY (MISCELLANEOUS) ×2
MAT ABSORB FLUID 56X50 GRAY (MISCELLANEOUS) ×1 IMPLANT
NDL SAFETY ECLIPSE 18X1.5 (NEEDLE) ×1 IMPLANT
NEEDLE HYPO 18GX1.5 SHARP (NEEDLE) ×2
NEEDLE REVERSE CUT 1/2 CRC (NEEDLE) IMPLANT
NEEDLE SPNL 20GX3.5 QUINCKE YW (NEEDLE) IMPLANT
NS IRRIG 1000ML POUR BTL (IV SOLUTION) ×3 IMPLANT
P2 COATDE GLNOID BSEPLT 6.5X30 (Shoulder) ×3 IMPLANT
PACK ARTHROSCOPY SHOULDER (MISCELLANEOUS) ×3 IMPLANT
PAD WRAPON POLAR SHDR XLG (MISCELLANEOUS) ×1 IMPLANT
PASSER SUT SWANSON 36MM LOOP (INSTRUMENTS) IMPLANT
PENCIL SMOKE ULTRAEVAC 22 CON (MISCELLANEOUS) ×3 IMPLANT
PULSAVAC PLUS IRRIG FAN TIP (DISPOSABLE) ×3
RETRIEVER SUT HEWSON (MISCELLANEOUS) IMPLANT
SCREW BONE LOCKING RSP 5.0X14 (Screw) ×3 IMPLANT
SCREW BONE LOCKING RSP 5.0X30 (Screw) ×3 IMPLANT
SCREW BONE RSP LOCK 5X14 (Screw) ×1 IMPLANT
SCREW BONE RSP LOCK 5X22 (Screw) ×1 IMPLANT
SCREW BONE RSP LOCK 5X30 (Screw) ×1 IMPLANT
SCREW BONE RSP LOCKING 5.0X32 (Screw) ×3 IMPLANT
SCREW RETAIN W/HEAD 4MM OFFSET (Shoulder) ×3 IMPLANT
SLING ULTRA II LG (MISCELLANEOUS) IMPLANT
SLING ULTRA II M (MISCELLANEOUS) IMPLANT
SPONGE GAUZE 2X2 8PLY STER LF (GAUZE/BANDAGES/DRESSINGS) ×1
SPONGE GAUZE 2X2 8PLY STRL LF (GAUZE/BANDAGES/DRESSINGS) ×2 IMPLANT
SPONGE LAP 18X18 RF (DISPOSABLE) ×12 IMPLANT
STAPLER SKIN PROX 35W (STAPLE) IMPLANT
STEM HUMERAL SM SHELL 12X48 (Stem) ×1 IMPLANT
STEM HUMERAL SMALL SHELL 12X48 (Stem) ×3 IMPLANT
STRAP SAFETY 5IN WIDE (MISCELLANEOUS) ×6 IMPLANT
STRIP CLOSURE SKIN 1/2X4 (GAUZE/BANDAGES/DRESSINGS) IMPLANT
SUT MNCRL AB 4-0 PS2 18 (SUTURE) ×3 IMPLANT
SUT PROLENE 6 0 P 1 18 (SUTURE) IMPLANT
SUT VIC AB 0 CT1 36 (SUTURE) ×3 IMPLANT
SUT VIC AB 2-0 CT2 27 (SUTURE) ×6 IMPLANT
SYR 20ML LL LF (SYRINGE) ×3 IMPLANT
SYR 5ML LL (SYRINGE) IMPLANT
TIP FAN IRRIG PULSAVAC PLUS (DISPOSABLE) ×1 IMPLANT
TRAY FOLEY SLVR 16FR LF STAT (SET/KITS/TRAYS/PACK) ×3 IMPLANT
WIRE GUIDE 2.4 PATIENT MATCH (MISCELLANEOUS) ×3 IMPLANT
WRAPON POLAR PAD SHDR XLG (MISCELLANEOUS) ×3

## 2019-08-28 NOTE — Evaluation (Signed)
Physical Therapy Evaluation Patient Details Name: Deborah Jimenez MRN: [id number] DOB: 1955-08-21 Today's Date: 08/28/2019   History of Present Illness  Pt is 64 yo female s/p R reverse TSA with R bicept tenodesis on 08/28/19, PMHx of COPD, former smoker, HTN, anxiety, depression, lip cancer, gastric bypass, polymyalgia rheumatica, bilat THA.   Clinical Impression  Patient alert, sitting in recliner. PLOF gathered from OT visit, pt lives with husband, level entry. Uses a rollator at baseline for mobility, WC for longer distances.The patient reported after her previous surgery that she was able to use her rollator with one hand.   The patient demonstrated sit <> stand with hand held assist, and also with hemiwalker. First attempt at ambulation pt reported SOB, light headedness, requested to sit, chair provided. BP, HR, and spO2 WFLs, Pt did also complain of chest tightness like a "panic attack" polar care sling loosened and pt reported improvement. After seated rest break, ambulation attempted again. Instructed in step to gait pattern, pt reported/exhibited unsteadiness. ~37ft the patient reported SOB, cued for standing rest break but unable to improve. HR in 120s. spO2 ~90%. PT and pt spoke about safety concerns for home, and returned to room.  Overall the patient demonstrated deficits (see "PT Problem List") that impede the patient's functional abilities, safety, and mobility and would benefit from skilled PT intervention. Recommendation is overnight stay to ensure patient safety, MD aware. Pending progress recommendation is HHPT with supervision/assistance 24hr.     Follow Up Recommendations Home health PT;Supervision/Assistance - 24 hour    Equipment Recommendations  Other (comment)(hemi walker)    Recommendations for Other Services       Precautions / Restrictions Precautions Precautions: Shoulder;Fall Type of Shoulder Precautions: watch vitals Shoulder Interventions: Shoulder  sling/immobilizer;Shoulder abduction pillow;At all times;Off for dressing/bathing/exercises Precaution Booklet Issued: Yes (comment) Restrictions Weight Bearing Restrictions: Yes RUE Weight Bearing: Non weight bearing Other Position/Activity Restrictions: PROM shoulder FF 0-90*, ER 0-30*, PROM elbow flex/ext ONLY, NO AROM shoulder/elbow, hand/wrist/fingers AROM OK, pendulums OK      Mobility  Bed Mobility               General bed mobility comments: deferred, up in recliner  Transfers                    Ambulation/Gait Ambulation/Gait assistance: Min guard Gait Distance (Feet): 11 Feet Assistive device: Hemi-walker       General Gait Details: Pt with significant trendelenburg gait. step to gait pattern with hemiwalker. seated rest break needed after 42ft due to lightheadness/SOB. able to ambulate another 54ft with CGA, became diaphoretic, SOB, HR in 120s.  Stairs            Wheelchair Mobility    Modified Rankin (Stroke Patients Only)       Balance Overall balance assessment: Needs assistance Sitting-balance support: Feet supported Sitting balance-Leahy Scale: Good       Standing balance-Leahy Scale: Poor                               Pertinent Vitals/Pain Pain Assessment: 0-10 Pain Score: 6  Pain Location: bilat hips Pain Descriptors / Indicators: Aching Pain Intervention(s): Limited activity within patient's tolerance;Monitored during session;Repositioned    Home Living Family/patient expects to be discharged to:: Private residence Living Arrangements: Spouse/significant other Available Help at Discharge: Family;Available 24 hours/day Type of Home: Apartment Home Access: Level entry     Home Layout: One level  Home Equipment: Tub bench;Walker - 4 wheels;Cane - single point;Hand held shower head;Adaptive equipment;Wheelchair - manual Additional Comments: Pt reported after her rotator cuff repair she was able to use her  rollator with one hand    Prior Function Level of Independence: Independent with assistive device(s)         Comments: Pt indep with rollator for HH distances, w/c for longer distances; indep with ADL, spouse does most of the driving     Hand Dominance   Dominant Hand: Right    Extremity/Trunk Assessment   Upper Extremity Assessment Upper Extremity Assessment: Defer to OT evaluation RUE Deficits / Details: hand/finger AROM WFL, pins and needles in hand and numb wrist to shoulder RUE: Unable to fully assess due to immobilization RUE Sensation: decreased light touch;decreased proprioception RUE Coordination: decreased gross motor;decreased fine motor LUE Deficits / Details: WFL, carpal tunnel    Lower Extremity Assessment Lower Extremity Assessment: LLE deficits/detail;RLE deficits/detail RLE Deficits / Details: grossly 4/5 LLE Deficits / Details: grossly 4/5, knee flexion 3+/5    Cervical / Trunk Assessment Cervical / Trunk Assessment: Normal  Communication   Communication: No difficulties  Cognition Arousal/Alertness: Awake/alert Behavior During Therapy: WFL for tasks assessed/performed Overall Cognitive Status: Within Functional Limits for tasks assessed                                        General Comments      Exercises Other Exercises Other Exercises: Pt/nurses instructed in polar care mgt and shoulder sling/immobilizer mgt including donning/doffing, correct positioning, and wear schedule; handout provided to support recall and carryover; pt/RNs verbalize understanding, pt reports spouse familiar with both from previous R RTC repair Other Exercises: Pt instructed in home/routines modifications for ADL and IADL, falls prevention strategies, pet care considerations, and hemi techniques for ADL with and without AE/DME; handout provided to support recall and carryover   Assessment/Plan    PT Assessment Patient needs continued PT services  PT  Problem List Decreased strength;Decreased mobility;Decreased safety awareness;Decreased range of motion;Decreased knowledge of precautions;Decreased activity tolerance;Decreased balance;Decreased knowledge of use of DME;Pain       PT Treatment Interventions DME instruction;Therapeutic exercise;Gait training;Balance training;Stair training;Neuromuscular re-education;Functional mobility training;Therapeutic activities;Patient/family education    PT Goals (Current goals can be found in the Care Plan section)  Acute Rehab PT Goals Patient Stated Goal: to get back to normal PT Goal Formulation: With patient Time For Goal Achievement: 09/11/19 Potential to Achieve Goals: Good    Frequency BID   Barriers to discharge        Co-evaluation               AM-PAC PT "6 Clicks" Mobility  Outcome Measure Help needed turning from your back to your side while in a flat bed without using bedrails?: A Little Help needed moving from lying on your back to sitting on the side of a flat bed without using bedrails?: A Little Help needed moving to and from a bed to a chair (including a wheelchair)?: A Little Help needed standing up from a chair using your arms (e.g., wheelchair or bedside chair)?: A Little Help needed to walk in hospital room?: A Little Help needed climbing 3-5 steps with a railing? : A Lot 6 Click Score: 17    End of Session Equipment Utilized During Treatment: Gait belt Activity Tolerance: Patient tolerated treatment well Patient left: in chair;with call bell/phone  within reach;with nursing/sitter in room Nurse Communication: Mobility status PT Visit Diagnosis: Other abnormalities of gait and mobility (R26.89);Pain;Muscle weakness (generalized) (M62.81) Pain - Right/Left: Right Pain - part of body: Shoulder    Time: 1410-1440 PT Time Calculation (min) (ACUTE ONLY): 30 min   Charges:   PT Evaluation $PT Eval Low Complexity: 1 Low PT Treatments $Therapeutic Exercise:  8-22 mins        Lieutenant Diego PT, DPT 3:58 PM,08/28/19

## 2019-08-28 NOTE — Anesthesia Procedure Notes (Signed)
Anesthesia Regional Block: Interscalene brachial plexus block   Pre-Anesthetic Checklist: ,, timeout performed, Correct Patient, Correct Site, Correct Laterality, Correct Procedure, Correct Position, site marked, Risks and benefits discussed,  Surgical consent,  Pre-op evaluation,  At surgeon's request and post-op pain management  Laterality: Right and Upper  Prep: chloraprep       Needles:  Injection technique: Single-shot  Needle Type: Stimiplex     Needle Length: 5cm  Needle Gauge: 22     Additional Needles:   Procedures:,,,, ultrasound used (permanent image in chart),,,,  Narrative:  Start time: 08/28/2019 7:33 AM End time: 08/28/2019 7:36 AM Injection made incrementally with aspirations every 5 mL.  Performed by: Personally  Anesthesiologist: Martha Clan, MD  Additional Notes: Functioning IV was confirmed and monitors were applied.  A 58mm 22ga Stimuplex needle was used. Sterile prep and drape,hand hygiene and sterile gloves were used.  Negative aspiration and negative test dose prior to incremental administration of local anesthetic. The patient tolerated the procedure well.

## 2019-08-28 NOTE — Anesthesia Postprocedure Evaluation (Signed)
Anesthesia Post Note  Patient: Deborah Jimenez  Procedure(s) Performed: REVERSE SHOULDER ARTHROPLASTY, BICEPS TENODESIS (Right Shoulder)  Patient location during evaluation: PACU Anesthesia Type: General Level of consciousness: awake and alert Pain management: pain level controlled Vital Signs Assessment: post-procedure vital signs reviewed and stable Respiratory status: spontaneous breathing, nonlabored ventilation, respiratory function stable and patient connected to nasal cannula oxygen Cardiovascular status: blood pressure returned to baseline and stable Postop Assessment: no apparent nausea or vomiting Anesthetic complications: no     Last Vitals:  Vitals:   08/28/19 1251 08/28/19 1422  BP: 140/71 (!) 153/73  Pulse: 87 86  Resp: 18 16  Temp: 36.7 C   SpO2: 95% 96%    Last Pain:  Vitals:   08/28/19 1422  TempSrc:   PainSc: 2                  Martha Clan

## 2019-08-28 NOTE — Anesthesia Procedure Notes (Signed)
Procedure Name: Intubation Performed by: Kelton Pillar, CRNA Pre-anesthesia Checklist: Patient identified, Emergency Drugs available, Suction available and Patient being monitored Patient Re-evaluated:Patient Re-evaluated prior to induction Oxygen Delivery Method: Circle system utilized Preoxygenation: Pre-oxygenation with 100% oxygen Induction Type: IV induction Ventilation: Mask ventilation without difficulty Laryngoscope Size: McGraph and 3 Grade View: Grade I Tube type: Oral Tube size: 6.5 mm Number of attempts: 1 Airway Equipment and Method: Stylet Placement Confirmation: ETT inserted through vocal cords under direct vision,  positive ETCO2,  CO2 detector and breath sounds checked- equal and bilateral Secured at: 21 cm Tube secured with: Tape Dental Injury: Teeth and Oropharynx as per pre-operative assessment

## 2019-08-28 NOTE — OR Nursing (Signed)
PT in for eval 1410.

## 2019-08-28 NOTE — Evaluation (Signed)
Occupational Therapy Evaluation Patient Details Name: Deborah Jimenez MRN: [id number] DOB: Dec 09, 1955 Today's Date: 08/28/2019    History of Present Illness Pt is 64 yo female s/p R reverse TSA with R bicept tenodesis on 08/28/19, PMHx of COPD, former smoker, HTN, anxiety, depression, lip cancer, gastric bypass, polymyalgia rheumatica, bilat THA   Clinical Impression   Patient was seen for an OT evaluation this date. Pt lives with her spouse in a level entry 1 story apartment with TTB for tub/shower which she uses for seated showers. Prior to surgery, pt was independent with ADL and mod indep with mobility using rollator for household distances and w/c for longer/community distances. Spouse does most of the driving. 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 Pt is R handed. Pt has orders for RUE to be immobilized and will be NWBing per MD. Patient presents with impaired strength/ROM and sensation to RUE with block in place. Pt endorses "pins and needles" in R hand, numb otherwise with decreased proprioception. Bilat hip pain. These impairments result in a decreased ability to perform self care tasks requiring mod assist for UB dressing and bathing and max assist for application of polar care and sling/immobilizer. Per pt, spouse familiar with polar care and sling from previous RTC repair to RUE in Dec 2019. Pt report she was in the sling for 6 months. Pt instructed in polar care mgt, pet care considerations, sling/immobilizer mgt, A/PROM exercises for RUE (with instructions for no shoulder exercises until full sensation has returned), RUE precautions, adaptive strategies for bathing/dressing/toileting/grooming, positioning and considerations for sleep, and home/routines modifications to maximize falls prevention, safety, and independence. Handout provided. OT adjusted sling/immobilizer and polar care to improve comfort, optimize positioning, and to  maximize skin integrity/safety, RNs present to facilitate carryover of instruction. Pt verbalized understanding of all education/training provided. Recommend follow up therapy as arranged by surgeon to continue therapy to maximize return to PLOF, address home/routines modifications and safety, minimize falls risk, and minimize caregiver burden.      Follow Up Recommendations  Follow surgeon's recommendation for DC plan and follow-up therapies    Equipment Recommendations  None recommended by OT    Recommendations for Other Services       Precautions / Restrictions Precautions Precautions: Shoulder;Fall Shoulder Interventions: Shoulder sling/immobilizer;Shoulder abduction pillow;At all times;Off for dressing/bathing/exercises Precaution Booklet Issued: Yes (comment) Restrictions Weight Bearing Restrictions: Yes RUE Weight Bearing: Non weight bearing Other Position/Activity Restrictions: PROM shoulder FF 0-90*, ER 0-30*, PROM elbow flex/ext ONLY, NO AROM shoulder/elbow, hand/wrist/fingers AROM OK, pendulums OK      Mobility Bed Mobility               General bed mobility comments: deferred, up in recliner  Transfers                      Balance                                           ADL either performed or assessed with clinical judgement   ADL Overall ADL's : Needs assistance/impaired                                       General ADL Comments: Min-Mod A for UB bathing/dressing;  CGA for ADL transfers; Max A for polar care and sling mgt; min A for LB bathing/dressing     Vision Baseline Vision/History: Wears glasses Wears Glasses: At all times Patient Visual Report: No change from baseline       Perception     Praxis      Pertinent Vitals/Pain Pain Assessment: 0-10 Pain Score: 6  Pain Location: bilat hips Pain Descriptors / Indicators: Aching Pain Intervention(s): Limited activity within patient's  tolerance;Monitored during session;Repositioned     Hand Dominance Right   Extremity/Trunk Assessment Upper Extremity Assessment Upper Extremity Assessment: RUE deficits/detail;LUE deficits/detail RUE Deficits / Details: hand/finger AROM WFL, pins and needles in hand and numb wrist to shoulder RUE: Unable to fully assess due to immobilization RUE Sensation: decreased light touch;decreased proprioception RUE Coordination: decreased gross motor;decreased fine motor LUE Deficits / Details: WFL, carpal tunnel   Lower Extremity Assessment Lower Extremity Assessment: Defer to PT evaluation       Communication Communication Communication: No difficulties   Cognition Arousal/Alertness: Awake/alert Behavior During Therapy: WFL for tasks assessed/performed Overall Cognitive Status: Within Functional Limits for tasks assessed                                     General Comments       Exercises Other Exercises Other Exercises: Pt/nurses instructed in polar care mgt and shoulder sling/immobilizer mgt including donning/doffing, correct positioning, and wear schedule; handout provided to support recall and carryover; pt/RNs verbalize understanding, pt reports spouse familiar with both from previous R RTC repair Other Exercises: Pt instructed in home/routines modifications for ADL and IADL, falls prevention strategies, pet care considerations, and hemi techniques for ADL with and without AE/DME; handout provided to support recall and carryover   Shoulder Instructions      Home Living Family/patient expects to be discharged to:: Private residence Living Arrangements: Spouse/significant other Available Help at Discharge: Family;Available 24 hours/day Type of Home: Apartment Home Access: Level entry     Home Layout: One level     Bathroom Shower/Tub: Teacher, early years/pre: Standard     Home Equipment: Tub bench;Walker - 4 wheels;Cane - single point;Hand  held shower head;Adaptive equipment;Wheelchair - Higher education careers adviser: Reacher        Prior Functioning/Environment Level of Independence: Independent with assistive device(s)        Comments: Pt indep with rollator for HH distances, w/c for longer distances; indep with ADL, spouse does most of the driving        OT Problem List: Decreased strength;Decreased range of motion;Decreased knowledge of precautions;Impaired UE functional use;Pain      OT Treatment/Interventions:      OT Goals(Current goals can be found in the care plan section) Acute Rehab OT Goals Patient Stated Goal: "go home and recover so I can return to painting and knitting" OT Goal Formulation: All assessment and education complete, DC therapy  OT Frequency:     Barriers to D/C:            Co-evaluation              AM-PAC OT "6 Clicks" Daily Activity     Outcome Measure Help from another person eating meals?: A Little Help from another person taking care of personal grooming?: A Little Help from another person toileting, which includes using toliet, bedpan, or urinal?: A Little Help from another person bathing (including washing, rinsing,  drying)?: A Lot Help from another person to put on and taking off regular upper body clothing?: A Lot Help from another person to put on and taking off regular lower body clothing?: A Little 6 Click Score: 16   End of Session Nurse Communication: Other (comment)(polar care and shoulder sling mgt)  Activity Tolerance: Patient tolerated treatment well Patient left: in chair;with call bell/phone within reach;with nursing/sitter in room  OT Visit Diagnosis: Other abnormalities of gait and mobility (R26.89);Pain Pain - Right/Left: Right(both) Pain - part of body: Hip                Time: [id number] OT Time Calculation (min): 29 min Charges:  OT General Charges $OT Visit: 1 Visit OT Evaluation $OT Eval Low Complexity: 1 Low OT Treatments $Self Care/Home  Management : 8-22 mins $Therapeutic Exercise: 8-22 mins  Jeni Salles, MPH, MS, OTR/L ascom [phone] 08/28/19, 2:38 PM

## 2019-08-28 NOTE — Discharge Instructions (Signed)
AMBULATORY SURGERY  DISCHARGE INSTRUCTIONS   1) The drugs that you were given will stay in your system until tomorrow so for the next 24 hours you should not:  A) Drive an automobile B) Make any legal decisions C) Drink any alcoholic beverage   2) You may resume regular meals tomorrow.  Today it is better to start with liquids and gradually work up to solid foods.  You may eat anything you prefer, but it is better to start with liquids, then soup and crackers, and gradually work up to solid foods.   3) Please notify your doctor immediately if you have any unusual bleeding, trouble breathing, redness and pain at the surgery site, drainage, fever, or pain not relieved by medication. 4)   5) Your post-operative visit with Dr.                                     is: Date:                        Time:    Please call to schedule your post-operative visit.  6) Additional Instructions:        Interscalene Nerve Block with Exparel  1.  For your surgery you have received an Interscalene Nerve Block with Exparel. 2. Nerve Blocks affect many types of nerves, including nerves that control movement, pain and normal sensation.  You may experience feelings such as numbness, tingling, heaviness, weakness or the inability to move your arm or the feeling or sensation that your arm has "fallen asleep". 3. A nerve block with Exparel can last up to 5 days.  Usually the weakness wears off first.  The tingling and heaviness usually wear off next.  Finally you may start to notice pain.  Keep in mind that this may occur in any order.  Once a nerve block starts to wear off it is usually completely gone within 60 minutes. 4. ISNB may cause mild shortness of breath, a hoarse voice, blurry vision, unequal pupils, or drooping of the face on the same side as the nerve block.  These symptoms will usually resolve with the numbness.  Very rarely the procedure itself can cause mild seizures. 5. If needed, your  surgeon will give you a prescription for pain medication.  It will take about 60 minutes for the oral pain medication to become fully effective.  So, it is recommended that you start taking this medication before the nerve block first begins to wear off, or when you first begin to feel discomfort. 6. Take your pain medication only as prescribed.  Pain medication can cause sedation and decrease your breathing if you take more than you need for the level of pain that you have. 7. Nausea is a common side effect of many pain medications.  You may want to eat something before taking your pain medicine to prevent nausea. 8. After an Interscalene nerve block, you cannot feel pain, pressure or extremes in temperature in the effected arm.  Because your arm is numb it is at an increased risk for injury.  To decrease the possibility of injury, please practice the following:  a. While you are awake change the position of your arm frequently to prevent too much pressure on any one area for prolonged periods of time. b.  If you have a cast or tight dressing, check the color or your fingers  every couple of hours.  Call your surgeon with the appearance of any discoloration (white or blue). c. If you are given a sling to wear before you go home, please wear it  at all times until the block has completely worn off.  Do not get up at night without your sling. d. Please contact [facility] or your surgeon if you do not begin to regain sensation after 7 days from the surgery.  Anesthesia may be contacted by calling the Same Day Surgery Department, Mon. through Fri., 6 am to 4 pm at [phone].   e. If you experience any other problems or concerns, please contact your surgeon's office. If you experience severe or prolonged shortness of breath go to the nearest emergency department.     Cato Mulligan, MD  [facility]  Phone: [phone]  Fax: [phone]   Discharge Instructions after Reverse Shoulder  Replacement   1. Activity/Sling: You are to be non-weight bearing on operative extremity. A sling/shoulder immobilizer has been provided for you. Only remove the sling to perform elbow, wrist, and hand RoM exercises and hygiene/dressing. Active reaching and lifting are not permitted. You will be given further instructions on sling use at your first physical therapy visit and postoperative visit with Dr. Posey Pronto.   2. Dressings: Dressing may be removed at 1st physical therapy visit (~3-4 days after surgery). Afterwards, you may either leave open to air (if no drainage) or cover with dry, sterile dressing. If you have steri-strips on your wound, please do not remove them. They will fall off on their own. You may shower 5 days after surgery. Please pat incision dry. Do not rub or place any shear forces across incision. If there is drainage or any opening of incision after 5 days, please notify our offices immediately.   3. Driving:  Plan on not driving for six weeks. Please note that you are advised NOT to drive while taking narcotic pain medications as you may be impaired and unsafe to drive.  4. Medications:  - You have been provided a prescription for narcotic pain medicine (usually oxycodone). After surgery, take 1-2 narcotic tablets every 4 hours if needed for severe pain. Please start this as soon as you begin to start having pain (if you received a nerve block, start taking as soon as this wears off).  - A prescription for anti-nausea medication will be provided in case the narcotic medicine causes nausea - take 1 tablet every 6 hours only if nauseated.  - Take enteric coated aspirin 325 mg once daily for 6 weeks to prevent blood clots. Do not take aspirin if you have an aspirin sensitivity/allergy or asthma or are on an anticoagulant (blood thinner) already. If so, then your home anticoagulant will be resume and managed - do not take aspirin. -Take tylenol 1000mg  (2 Extra strength or 3 regular  strength tablets) every 8 hours for pain. This will reduce the amount of narcotic medication needed. May stop tylenol when you are having minimal pain. - Take a stool softener (Colace, Dulcolax or Senakot) if you are using narcotic pain medications to help with constipation that is associated with narcotic use. - DO NOT take ANY nonsteroidal anti-inflammatory pain medications: Advil, Motrin, Ibuprofen, Aleve, Naproxen, or Naprosyn.  If you are taking prescription medication for anxiety, depression, insomnia, muscle spasm, chronic pain, or for attention deficit disorder you are advised that you are at a higher risk of adverse effects with use of narcotics post-op, including narcotic addiction/dependence, depressed  breathing, death. If you use non-prescribed substances: alcohol, marijuana, cocaine, heroin, methamphetamines, etc., you are at a higher risk of adverse effects with use of narcotics post-op, including narcotic addiction/dependence, depressed breathing, death. You are advised that taking > 50 morphine milligram equivalents (MME) of narcotic pain medication per day results in twice the risk of overdose or death. For your prescription provided: oxycodone 5 mg - taking more than 6 tablets per day after the first few days of surgery.  5. Physical Therapy: 1-2 times per week for ~12 weeks. Therapy typically starts on post operative Day 3 or 4. You have been provided an order for physical therapy. The therapist will provide home exercises. Please contact our offices if this appointment has not been scheduled.   6. Work: May do light duty/desk job in approximately 2 weeks when off of narcotics, pain is well-controlled, and swelling has decreased if able to function with one arm in sling. Full work may take 6 weeks if light motions and function of both arms is required. Lifting jobs may require 12 weeks.  7. Post-Op Appointments: Your first post-op appointment will be with Dr. Posey Pronto in  approximately 2 weeks time.   If you find that they have not been scheduled please call the Orthopaedic Appointment front desk at [phone].                               Cato Mulligan, MD [facility] Phone: [phone] Fax: [phone]   REVERSE SHOULDER ARTHROPLASTY REHAB GUIDELINES   These guidelines should be tailored to individual patients based on their rehab goals, age, precautions, quality of repair, etc.  Progression should be based on patient progress and approval by the referring physician.  PHASE 1 - Day 1 through Week 2  GENERAL GUIDELINES AND PRECAUTIONS  Sling wear 24/7 except during grooming and home exercises (3 to 5 times daily)  Avoid shoulder extension such that the arm is posterior the frontal plane.  When patients recline, a pillow should be placed behind the upper arm and sling should be on.  They should be advised to always be able to see the elbow  Avoid combined IR/ADD/EXT, such as hand behind back to prevent dislocation  Avoid combined IR and ADD such as reaching across the chest to prevent dislocation  No AROM  No submersion in pool/water for 4 weeks  No weight bearing through operative arm (as in transfers, walker use, etc)  GOALS  Maintain integrity of joint replacement; protect soft tissue healing  Increase PROM for elevation to 120 and ER to 30 (will remain the goal for first 6 weeks)  Optimize distal UE circulation and muscle activity (elbow, wrist and hand)  Instruct in use of sling for proper fit, polar care device for ice application after HEP, signs/symptoms of infection  EXERCISES  Active elbow, wrist and hand  Passive forward elevation in scapular plane to 90-120 max motion; ER in scapular plane to 30  Active scapular retraction with arms resting in neutral position  CRITERIA TO PROGRESS TO PHASE 2  Low pain (less than 3/10) with shoulder PROM  Healing of incision without signs of  infection  Clearance by MD to advance after 2 week MD check up  PHASE 2 - 2 weeks - 6 weeks  GENERAL GUIDELINES AND PRECAUTIONS  Sling may be removed while at home; worn in community without abduction pillow  May use arm for light activities of daily  living (such as feeding, brushing teeth, dressing) with elbow near  the side of the body  and arm in front of the body- no active lifting of the arm  May submerge in water (tub, pool, [place], etc) after 4 weeks  Continue to avoid WBing through the operative arm  Continue to avoid combined IR/EXT/ADD (hand behind the back) and IR/ADD  (reaching across chest) for dislocation precautions  GOALS   Achieve passive elevation to 120 and ER to 30   Low (less than 3/10) to no pain   Ability to fire all heads of the deltoid  EXERCISES  May discontinue grip, and active elbow and wrist exercises since using the arm in ADLs  with sling removed around the home  Continue passive elevation to 120 and ER to 30, both in scapular plane with arm supported on table top  Add submaximal isometrics, pain free effort, for all functional heads of deltoid (anterior, posterior, middle)  Ensure that with posterior deltoid isometric the shoulder does not move into extension and the arm remains anterior the frontal plane  At 4 weeks:  begin to place arm in balanced position of 90 deg elevation in supine; when patient able to hold this position with ease, may begin reverse pendulums clockwise and counterclockwise  CRITERIA TO PROGRESS TO PHASE 3  Passive forward elevation in scapular plane to 120; passive ER in scapular plane to 30  Ability to fire isometrically all heads of the deltoid muscle without pain  Ability to place and hold the arm in balanced position (90 deg elevation in supine)  PHASE 3 - 6 weeks to 3 months  GENERAL GUIDELINES AND PRECAUTIONS  Discontinue use of sling  Avoid forcing end range motion in any direction to prevent  dislocation   May advance use of the arm actively in ADLs without being restricted to arm by the side of the body, however, avoid heavy lifting and sports (forever!)  May initiate functional IR behind the back gently  NO UPPER BODY ERGOMETER   GOALS  Optimize PROM for elevation and ER in scapular plane with realistic expectation that max  mobility for elevation is usually around 145-160 passively; ER 40 to 50 passively; functional IR to L1  Recover AROM to approach as close to PROM available as possible; may expect 135-150 deg active elevation; 30 deg active ER; active functional IR to L1  Establish dynamic stability of the shoulder with deltoid and periscapular muscle gradual strengthening  EXERCISES  Forward elevation in scapular plane active progression: supine to incline, to vertical; short to long lever arm  Balanced position long lever arm AROM  Active ER/IR with arm at side  Scapular retraction with light band resistance  Functional IR with hand slide up back - very gentle and gradual  NO UPPER BODY ERGOMETER     CRITERIA TO PROGRESS TO PHASE 4   AROM equals/approaches PROM with good mechanics for elevation    No pain   Higher level demand on shoulder than ADL functions   PHASE 4 12 months and beyond  GENERAL GUIDELINES AND PRECAUTIONS  No heavy lifting and no overhead sports  No heavy pushing activity  Gradually increase strength of deltoid and scapular stabilizers; also the rotator cuff if present with weights not to exceed 5 lbs  NO UPPER BODY ERGOMETER   GOALS   Optimize functional use of the operative UE to meet the desired demands   Gradual increase in deltoid, scapular muscle, and rotator cuff strength  Pain free functional activities   EXERCISES  Add light hand weights for deltoid up to and not to exceed 3 lbs for anterior and posterior with long arm lift against gravity; elbow bent to 90 deg for abduction in scapular plane  Theraband  progression for extension to hip with scapular depression/retraction  Theraband progression for serratus anterior punches in supine; avoid wall, incline or prone pressups for serratus anterior  End range stretching gently without forceful overpressure in all planes (elevation in scapular plane, ER in scapular plane, functional IR) with stretching done for life as part of a daily routine  NO UPPER BODY ERGOMETER     CRITERIA FOR DISCHARGE FROM SKILLED PHYSICAL THERAPY   Pain free AROM for shoulder elevation (expect around 135-150)   Functional strength for all ADLs, work tasks, and hobbies approved by Psychologist, sport and exercise   Independence with home maintenance program   NOTES: 1. With proper exercise, motion, strength, and function continue to improve even after one year. 2. The complication rate after surgery is 5 - 8%. Complications include infection, fracture, heterotopic bone formation, nerve injury, instability, rotator cuff tear, and tuberosity nonunion. Please look for clinical signs, unusual symptoms, or lack of progress with therapy and report those to Dr. Posey Pronto. Prefer more communication than less.  3. The therapy plan above only serves as a guide. Please be aware of specific individualized patient instructions as written on the prescription or through discussions with the surgeon. 4. Please call Dr. Posey Pronto if you have any specific questions or concerns [phone]
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
## 2019-08-28 NOTE — H&P (Signed)
Paper H&P to be scanned into permanent record. H&P reviewed. No significant changes noted.  

## 2019-08-28 NOTE — OR Nursing (Signed)
OT in for eval. 1339.

## 2019-08-28 NOTE — Op Note (Addendum)
SURGERY DATE: 08/28/2019   PRE-OP DIAGNOSIS:  1. Right shoulder rotator cuff arthropathy   POST-OP DIAGNOSIS:  1. Right shoulder rotator cuff arthropathy   PROCEDURES:  1. Right reverse total shoulder arthroplasty 2. Right biceps tenodesis   SURGEON: Cato Mulligan, MD  ASSISTANTS: Reche Dixon, PA   ANESTHESIA: Gen + interscalene block   ESTIMATED BLOOD LOSS: 300cc   TOTAL IV FLUIDS: per anesthesia record  IMPLANTS: DJO Surgical: RSP Glenoid Head w/Retaining screw 32-4; Monoblock Reverse Shoulder Baseplate with 1.6XW central screw; 3 locking screws into baseplate (superior, anterior, inferior); Small Shell Short Humeral Stem 12 x 12m;  Neutral +4 Small Socket Insert;    INDICATION(S):  Deborah Cuadradois a 64y.o. female who initially underwent a righ rotator cuff repair on 07/18/2018 by me.  She did well initially after the surgery and felt like she had a full recovery.  However approximately 3 months ago, the patient had progressively increasing shoulder pain and weakness without any known traumatic injury.  She had difficulty raising her arm overhead.  Repeat exam and imaging was concerning for large rotator cuff tear with significant retraction and fatty atrophy. The patient had failed further conservative measures.  After discussion of risks, benefits, and alternatives to surgery, the patient elected to proceed with reverse shoulder arthroplasty and biceps tenodesis.   OPERATIVE FINDINGS: massive rotator cuff tear (complete supraspinatus and complete subscapularis)   OPERATIVE REPORT:   I identified Deborah Jimenez the pre-operative holding area. Informed consent was obtained and the surgical site was marked. I reviewed the risks and benefits of the proposed surgical intervention and the patient wished to proceed. An interscalene block with Exparel was administered by the Anesthesia team. The patient was transferred to the operative suite and general anesthesia was  administered. The patient was placed in the beach chair position with the head of the bed elevated approximately 45 degrees. All down side pressure points were appropriately padded. Pre-op exam under anesthesia confirmed some stiffness and crepitus. Appropriate IV antibiotics were administered.  A dose of tranexamic acid was administered preoperatively after verifying that the patient had no contraindications.  The extremity was then prepped and draped in standard fashion. A time out was performed confirming the correct extremity, correct patient, and correct procedure.   We used the standard deltopectoral incision from the coracoid to ~12cm distal. We found the cephalic vein and took it laterally. We opened the deltopectoral interval widely and placed retractors under the CA ligament in the subacromial space and under the deltoid tendon at its insertion. We then abducted and internally rotated the arm and released the underlying bursa between these retractors, taking care not to damage the circumflex branch of the axillary nerve.   Next, we brought the arm back in adduction at slight forward flexion with external rotation. We opened the clavipectoral fascia lateral to the conjoint tendon. We gently palpated the axillary nerve and verified its position and continuity on both sides of the humerus with a Tug test. This test was repeated multiple times during the procedure for nerve localization and confirmed to be intact at the end of the case. We then cauterized the anterior humeral circumflex ("Three sisters") vessels. The arm was then internally rotated, we cut the falciform ligament at approximately 1 cm of the upper portion of the pectoralis major insertion. Next we unroofed the bicipital groove. We proceeded with a soft tissue biceps tenodesis given the pathology (significant thickening and tendinopathy proximally) of the tendon.  Next, I  performed a biceps tenodesis with a #2 TiCron suture to the upper  border of the pectoralis major. The proximal portion of the tendon was excised.    At this point, we could see that the supraspinatus was completely torn with a bald humeral head superiorly. The subscapularis also had essentially a complete tear. We performed a subscapularis peel using electrocautery to remove the anterior capsule and subscapularis off of the humeral head. We released the inferior capsule from the humerus all the way to the posterior band of the inferior glenohumeral ligament. When this was complete we gently dislocated the shoulder up into the wound. We removed any osteophytes and made our cut with the appropriate inclination in 30 degrees of retroversion.  2 suture anchors from prior rotator cuff repair were noted and these were removed.  We then turned our attention back to the glenoid. The proximal humerus was retracted posteriorly. The anterior capsule was dissected free from the subscapularis. The anterior capsule was then excised, exposing the anterior glenoid. We then grasped the labrum and removed it circumferentially. During the glenoid exposure, the axillary nerve was protected the entire time.    A patient-specific guide was used to drill the central guidepin. A tap was placed, matching the trajectory of the guidepin. An appropriately sized reamer was used to ream the glenoid. The monoblock baseplate was inserted and appropriate fixation was achieved. The 3 peripheral screws were drilled, measured, and placed. A 32-4 glenosphere was then placed and tightened.   We then turned our attention back to the humerus. We sized for a small-shell prosthesis. We sequentially used larger diameter canal finders until we met significant resistance and sequential broaching was performed to this size listed above. We trialed both a 0 and +4 poly. The humerus was trialed and noted to have satisfactory stability, motion, and deltoid tension with a +4 poly. We drilled 3 holes and passed 3 #2  Fiberwire sutures through for our subscapularis repair. The final humeral component was then inserted. We placed the poly insert. The humerus was reduced and final confirmation of motion, tension, and stability were satisfactory.  The subscapularis was significantly attenuated.  There is only good quality tendon of the superior portion of the subscapularis.  Subscapularis was repaired with the 2 superior previously passed #2 FiberWire sutures after passing them through the subscapularis.   We again verified the tension on the axillary nerve, appropriate range of motion, stability of the implant, and security of the subscapularis repair. We closed the deltopectoral interval deep to the cephalic vein with a running, 0-Vicryl suture. The skin was closed with 2-0 Vicryl and 4-0 Monocryl and Dermabond.  Sterile dressing was applied. A PolarCare unit and sling were placed. Patient was extubated, transferred to a stretcher bed and to the post anesthesia care unit in stable condition.   Of note, assistance from a PA was essential to performing the surgery.  PA was present for the entire surgery.  PA assisted with patient positioning, retraction, instrumentation, and wound closure. The surgery would have been more difficult and had longer operative time without PA assistance.    POSTOPERATIVE PLAN: The patient will be discharged home on POD#0. Operative arm to remain in sling at all times except RoM exercises and hygiene. Can perform pendulums, elbow/wrist/hand RoM exercises.  PT/OT prior to discharge.  TXA prior to discharge as well.  Passive RoM allowed to 90 FF and 30 ER. ASA 395m x 6 weeks for DVT ppx. Plan for home health PT with  [facility] protocol.

## 2019-08-28 NOTE — Transfer of Care (Signed)
Immediate Anesthesia Transfer of Care Note  Patient: Deborah Jimenez  Procedure(s) Performed: REVERSE SHOULDER ARTHROPLASTY, BICEPS TENODESIS (Right Shoulder)  Patient Location: PACU  Anesthesia Type:General  Level of Consciousness: awake, alert , oriented and patient cooperative  Airway & Oxygen Therapy: Patient Spontanous Breathing  Post-op Assessment: Report given to RN and Post -op Vital signs reviewed and stable  Post vital signs: Reviewed and stable  Last Vitals:  Vitals Value Taken Time  BP 128/67 08/28/19 1117  Temp 36.6 C 08/28/19 1117  Pulse 84 08/28/19 1121  Resp 18 08/28/19 1121  SpO2 93 % 08/28/19 1121  Vitals shown include unvalidated device data.  Last Pain:  Vitals:   08/28/19 0729  TempSrc:   PainSc: 7          Complications: No apparent anesthesia complications

## 2019-08-28 NOTE — OR Nursing (Signed)
PT coversed with Dr. Posey Pronto re eval.  MD to place admission orders for patient. 1430.

## 2019-08-29 DIAGNOSIS — M75121 Complete rotator cuff tear or rupture of right shoulder, not specified as traumatic: Secondary | ICD-10-CM | POA: Diagnosis not present

## 2019-08-29 DIAGNOSIS — M353 Polymyalgia rheumatica: Secondary | ICD-10-CM | POA: Diagnosis not present

## 2019-08-29 DIAGNOSIS — M67813 Other specified disorders of tendon, right shoulder: Secondary | ICD-10-CM | POA: Diagnosis not present

## 2019-08-29 DIAGNOSIS — F329 Major depressive disorder, single episode, unspecified: Secondary | ICD-10-CM | POA: Diagnosis not present

## 2019-08-29 DIAGNOSIS — K219 Gastro-esophageal reflux disease without esophagitis: Secondary | ICD-10-CM | POA: Diagnosis not present

## 2019-08-29 DIAGNOSIS — E039 Hypothyroidism, unspecified: Secondary | ICD-10-CM | POA: Diagnosis not present

## 2019-08-29 DIAGNOSIS — G4700 Insomnia, unspecified: Secondary | ICD-10-CM | POA: Diagnosis not present

## 2019-08-29 DIAGNOSIS — I10 Essential (primary) hypertension: Secondary | ICD-10-CM | POA: Diagnosis not present

## 2019-08-29 DIAGNOSIS — M19011 Primary osteoarthritis, right shoulder: Secondary | ICD-10-CM | POA: Diagnosis not present

## 2019-08-29 LAB — CBC
HCT: 32.9 % — ABNORMAL LOW (ref 36.0–46.0)
Hemoglobin: 10.5 g/dL — ABNORMAL LOW (ref 12.0–15.0)
MCH: 31.6 pg (ref 26.0–34.0)
MCHC: 31.9 g/dL (ref 30.0–36.0)
MCV: 99.1 fL (ref 80.0–100.0)
Platelets: 222 10*3/uL (ref 150–400)
RBC: 3.32 MIL/uL — ABNORMAL LOW (ref 3.87–5.11)
RDW: 13.8 % (ref 11.5–15.5)
WBC: 8.5 10*3/uL (ref 4.0–10.5)
nRBC: 0 % (ref 0.0–0.2)

## 2019-08-29 LAB — BASIC METABOLIC PANEL
Anion gap: 11 (ref 5–15)
BUN: 20 mg/dL (ref 8–23)
CO2: 24 mmol/L (ref 22–32)
Calcium: 8.7 mg/dL — ABNORMAL LOW (ref 8.9–10.3)
Chloride: 103 mmol/L (ref 98–111)
Creatinine, Ser: 0.74 mg/dL (ref 0.44–1.00)
GFR calc Af Amer: >60 mL/min (ref >60)
GFR calc non Af Amer: >60 mL/min (ref >60)
Glucose, Bld: 108 mg/dL — ABNORMAL HIGH (ref 70–99)
Potassium: 3.8 mmol/L (ref 3.5–5.1)
Sodium: 138 mmol/L (ref 135–145)

## 2019-08-29 LAB — SURGICAL PATHOLOGY

## 2019-08-29 NOTE — Discharge Summary (Signed)
Physician Discharge Summary  Subjective: 1 Day Post-Op Procedure(s) (LRB): REVERSE SHOULDER ARTHROPLASTY, BICEPS TENODESIS (Right) Patient reports pain as moderate.   Patient seen in rounds with Dr. Posey Pronto. Patient is well, and has had no acute complaints or problems Patient is ready to go home after physical therapy  Physician Discharge Summary  Patient ID: Deborah Jimenez MRN: [id number] DOB/AGE: 65-Oct-1957 65 y.o.  Admit date: 08/28/2019 Discharge date: 08/29/2019  Admission Diagnoses:  Discharge Diagnoses:  Active Problems:   S/p reverse total shoulder arthroplasty   Discharged Condition: fair  Hospital Course: The patient is postop day 1 from a right reverse total shoulder arthroplasty done by Dr. Posey Pronto.  The patient was admitted overnight because of low oxygen saturation.  She was able to do physical therapy in the afternoon yesterday with some difficulty.  Her oxygen saturation is around 97 at this time on room air.  She is much improved.  Her pain management is doing well.  The patient is ready to go home after physical therapy this morning.  Treatments: surgery:  1. Right reverse total shoulder arthroplasty 2. Right biceps tenodesis  SURGEON: Cato Mulligan, MD  ASSISTANTS: Reche Dixon, PA  ANESTHESIA: Gen + interscalene block  ESTIMATED BLOOD LOSS: 300cc  TOTAL IV FLUIDS: per anesthesia record  IMPLANTS: DJO Surgical: RSP Glenoid Head w/Retaining screw 32-4; Monoblock Reverse Shoulder Baseplate with [id number] central screw; 3 locking screws into baseplate (superior, anterior, inferior); Small Shell Short Humeral Stem 12 x 21mm;  Neutral +4 Small Socket Insert;   Discharge Exam: Blood pressure (!) 161/80, pulse 71, temperature 97.7 F (36.5 C), temperature source Oral, resp. rate 16, height 5\' 2"  (1.575 m), weight 72.6 kg, SpO2 96 %.   Disposition: Discharge disposition: 01-Home or Self Care        Allergies as of 08/29/2019      Reactions   Bee  Pollen Anaphylaxis   Swells wherever she is stung.  Needs an epipen   Penicillins Anaphylaxis   Has patient had a PCN reaction causing immediate rash, facial/tongue/throat swelling, SOB or lightheadedness with hypotension: Yes Has patient had a PCN reaction causing severe rash involving mucus membranes or skin necrosis: No Has patient had a PCN reaction that required hospitalization: No Has patient had a PCN reaction occurring within the last 10 years: No If all of the above answers are "NO", then may proceed with Cephalosporin use.   Latex Rash   Allergic rash reaction to oral surgery equipment   Wool Alcohol [lanolin] Hives, Itching   Adhesive [tape] Other (See Comments)   Rips off top layer of skin. Paper tape is okay   Naprosyn [naproxen] Diarrhea   Tramadol Other (See Comments)   Feels like ants are crawling all over her      Medication List    STOP taking these medications   celecoxib 100 MG capsule Commonly known as: CELEBREX   clindamycin 300 MG capsule Commonly known as: CLEOCIN     TAKE these medications   acetaminophen 500 MG tablet Commonly known as: TYLENOL Take 2 tablets (1,000 mg total) by mouth every 8 (eight) hours.   ALIVE WOMENS GUMMY PO Take 2 tablets by mouth 2 (two) times daily.   amitriptyline 25 MG tablet Commonly known as: ELAVIL TAKE 1 TABLET BY MOUTH AT BEDTIME   aspirin EC 325 MG tablet Take 1 tablet (325 mg total) by mouth daily.   buPROPion 100 MG tablet Commonly known as: WELLBUTRIN TAKE 1 TABLET BY MOUTH ONCE DAILY IN  THE MORNING What changed: See the new instructions.   Calcium 600+D 600-800 MG-UNIT Tabs Generic drug: Calcium Carb-Cholecalciferol Take 1 tablet by mouth daily.   DULoxetine 30 MG capsule Commonly known as: CYMBALTA TAKE 1 CAPSULE BY MOUTH ONCE DAILY TO  BE  COMBINED  WITH  60MG  What changed: See the new instructions.   DULoxetine 60 MG capsule Commonly known as: CYMBALTA TAKE 1 CAPSULE BY MOUTH ONCE DAILY (TO  BE TAKEN WITH 30 MG) What changed: See the new instructions.   EPINEPHrine 0.3 mg/0.3 mL Soaj injection Commonly known as: EPI-PEN Inject 0.3 mLs (0.3 mg total) into the muscle as needed. What changed: reasons to take this   levothyroxine 200 MCG tablet Commonly known as: SYNTHROID Take 1 tablet (200 mcg total) by mouth daily. What changed: when to take this   lisinopril 20 MG tablet Commonly known as: ZESTRIL Take 1 tablet (20 mg total) by mouth daily.   Magnesium 250 MG Tabs Take 250 mg by mouth daily.   Melatonin 3 MG Tabs Take 3-6 mg by mouth at bedtime.   ondansetron 4 MG disintegrating tablet Commonly known as: Zofran ODT Take 1 tablet (4 mg total) by mouth every 8 (eight) hours as needed for nausea or vomiting.   oxyCODONE 5 MG immediate release tablet Commonly known as: Roxicodone Take 1-2 tablets (5-10 mg total) by mouth every 4 (four) hours as needed (pain).   pantoprazole 40 MG tablet Commonly known as: PROTONIX Take 1 tablet (40 mg total) by mouth daily. What changed: when to take this   Shingrix injection Generic drug: Zoster Vaccine Adjuvanted   Vitamin D3 125 MCG (5000 UT) Tabs Take 5,000 Units by mouth daily.   zolpidem 10 MG tablet Commonly known as: AMBIEN TAKE 1/2 TO 1 (ONE-HALF TO ONE) TABLET BY MOUTH AT BEDTIME AS NEEDED FOR SLEEP What changed: See the new instructions.      Follow-up Information    Leim Fabry, MD Follow up on 09/14/2019.   Specialty: Orthopedic Surgery Why: 10:45 am  IN THE [facility] OFFICE Contact information: [place] [address] [phone]           Signed: Prescott Parma, Romelo Sciandra 08/29/2019, 7:12 AM   Objective: Vital signs in last 24 hours: Temp:  [97.6 F (36.4 C)-98.7 F (37.1 C)] 97.7 F (36.5 C) (02/02 0446) Pulse Rate:  [71-89] 71 (02/02 0446) Resp:  [15-29] 16 (02/02 0446) BP: (120-185)/(47-94) 161/80 (02/02 0446) SpO2:  [92 %-99 %] 96 % (02/02 0446) Weight:  [72.6 kg] 72.6 kg  (02/01 2002)  Intake/Output from previous day:  Intake/Output Summary (Last 24 hours) at 08/29/2019 [id number] Last data filed at 08/29/2019 0300 Gross per 24 hour  Intake 2110 ml  Output 850 ml  Net 1260 ml    Intake/Output this shift: No intake/output data recorded.  Labs: Recent Labs    08/28/19 0615  HGB 12.5   Recent Labs    08/28/19 0615  WBC 10.5  RBC 3.93  HCT 39.0  PLT 270   No results for input(s): NA, K, CL, CO2, BUN, CREATININE, GLUCOSE, CALCIUM in the last 72 hours. No results for input(s): LABPT, INR in the last 72 hours.  EXAM: General - Patient is Alert and Oriented Extremity - Neurovascular intact Sensation intact distally Dorsiflexion/Plantar flexion intact Incision - clean, dry, no drainage with the shoulder immobilizer in place. Motor Function -dorsiflexion and volar flexion of the fingers and wrist are intact.  Normal grip strength.  Assessment/Plan: 1 Day  Post-Op Procedure(s) (LRB): REVERSE SHOULDER ARTHROPLASTY, BICEPS TENODESIS (Right) Procedure(s) (LRB): REVERSE SHOULDER ARTHROPLASTY, BICEPS TENODESIS (Right) Past Medical History:  Diagnosis Date  . Anemia    vitamin b12 and iron deficiency  . Cancer (HCC)    lip  . Chronic kidney disease 2012   kidneys stopped working after hip surgery, unsure why but okay now  . Depression   . Dyspnea    when over 300 lbs, prior to gastric bypass  . GERD (gastroesophageal reflux disease)   . Gout   . Hypertension   . Hypothyroidism   . Insomnia   . Polymyalgia rheumatica syndrome (HCC)    extensive steroid therapy in the initial stages of diagnosis  . Thyroid disease   . Weight loss    Active Problems:   S/p reverse total shoulder arthroplasty  Estimated body mass index is 29.26 kg/m as calculated from the following:   Height as of this encounter: 5\' 2"  (1.575 m).   Weight as of this encounter: 72.6 kg. Advance diet Up with therapy D/C IV fluids Diet - Regular diet Follow up - in 2  weeks Activity - WBAT and shoulder immobilizer on at all times. Disposition - Home Condition Upon Discharge - Stable DVT Prophylaxis - Aspirin  Reche Dixon, PA-C Orthopaedic Surgery 08/29/2019, 7:12 AM

## 2019-08-29 NOTE — Progress Notes (Signed)
PT Cancellation Note  Patient Details Name: Deborah Jimenez MRN: [id number] DOB: [birth year]   Cancelled Treatment:    Reason Eval/Treat Not Completed: Other (comment)(Pt eating breakfast when PT entered room, PT to follow up as able this AM.)   Lieutenant Diego PT, DPT 8:40 AM,08/29/19

## 2019-08-29 NOTE — Progress Notes (Signed)
  Subjective: 1 Day Post-Op Procedure(s) (LRB): REVERSE SHOULDER ARTHROPLASTY, BICEPS TENODESIS (Right) Patient reports pain as moderate.   Patient is well, and has had no acute complaints or problems.  She initially had some problems sleeping, but this improved. Plan is to go Home after hospital stay. Negative for chest pain and shortness of breath Fever: no Gastrointestinal: Negative for nausea and vomiting  Objective: Vital signs in last 24 hours: Temp:  [97.6 F (36.4 C)-98.7 F (37.1 C)] 97.7 F (36.5 C) (02/02 0446) Pulse Rate:  [71-89] 71 (02/02 0446) Resp:  [15-29] 16 (02/02 0446) BP: (120-185)/(47-94) 161/80 (02/02 0446) SpO2:  [92 %-99 %] 96 % (02/02 0446) Weight:  [72.6 kg] 72.6 kg (02/01 2002)  Intake/Output from previous day:  Intake/Output Summary (Last 24 hours) at 08/29/2019 0708 Last data filed at 08/29/2019 0300 Gross per 24 hour  Intake 2110 ml  Output 850 ml  Net 1260 ml    Intake/Output this shift: No intake/output data recorded.  Labs: Recent Labs    08/28/19 0615  HGB 12.5   Recent Labs    08/28/19 0615  WBC 10.5  RBC 3.93  HCT 39.0  PLT 270   No results for input(s): NA, K, CL, CO2, BUN, CREATININE, GLUCOSE, CALCIUM in the last 72 hours. No results for input(s): LABPT, INR in the last 72 hours.   EXAM General - Patient is Alert and Oriented Extremity - Neurovascular intact Sensation intact distally Dorsiflexion/Plantar flexion intact  Normal grip strength.  Able to give posterior pressure with elbow. Dressing/Incision - clean, dry, no drainage Motor Function - intact, moving fingers and wrist well on exam.   Past Medical History:  Diagnosis Date  . Anemia    vitamin b12 and iron deficiency  . Cancer (HCC)    lip  . Chronic kidney disease 2012   kidneys stopped working after hip surgery, unsure why but okay now  . Depression   . Dyspnea    when over 300 lbs, prior to gastric bypass  . GERD (gastroesophageal reflux disease)    . Gout   . Hypertension   . Hypothyroidism   . Insomnia   . Polymyalgia rheumatica syndrome (HCC)    extensive steroid therapy in the initial stages of diagnosis  . Thyroid disease   . Weight loss     Assessment/Plan: 1 Day Post-Op Procedure(s) (LRB): REVERSE SHOULDER ARTHROPLASTY, BICEPS TENODESIS (Right) Active Problems:   S/p reverse total shoulder arthroplasty  Estimated body mass index is 29.26 kg/m as calculated from the following:   Height as of this encounter: 5\' 2"  (1.575 m).   Weight as of this encounter: 72.6 kg. Advance diet Up with therapy D/C IV fluids  Discharge home after physical therapy.  DVT Prophylaxis - Aspirin Right shoulder and arm in the shoulder immobilizer at all times  Reche Dixon, PA-C Orthopaedic Surgery 08/29/2019, 7:08 AM

## 2019-08-29 NOTE — Progress Notes (Signed)
Patient discharged home with home health. All discharge instructions given and all questions answered. Prescriptions given to patient.

## 2019-08-29 NOTE — TOC Transition Note (Addendum)
Transition of Care [facility]) - CM/SW Discharge Note   Patient Details  Name: Deborah Jimenez MRN: [id number] Date of Birth: 1956-06-13  [facility] of [facility]) CM/SW Contact:  Su Hilt, RN Phone Number: 08/29/2019, 9:23 AM   Clinical Narrative:    Met witht he patient to discuss DC needs and plan, She needs a new Rollator and one has been ordered for her will be shipped from [place], [facility] was set up prior to surgery and they will call her to arrange when they will come, Her husband is aware of her DC and he has an appointment the bedside nurse to call once ready.     Final next level of care: [place] Barriers to Discharge: Barriers Resolved   Patient Goals and CMS Choice Patient states their goals for this hospitalization and ongoing recovery are:: go home      Discharge Placement                       Discharge Plan and Services   Discharge Planning Services: CM Consult            DME Arranged: N/A         HH Arranged: PT, OT [place] Agency: [facility] (formerly [employer]) Date [place]: 08/29/19 Time [place]: [phone] Representative spoke with at [place]: [place] ([place]) Interventions     Readmission Risk Interventions No flowsheet data found.

## 2019-08-29 NOTE — Progress Notes (Addendum)
Physical Therapy Treatment Patient Details Name: Deborah Jimenez MRN: [id number] DOB: 1955/11/24 Today's Date: 08/29/2019    History of Present Illness Pt is 64 yo female s/p R reverse TSA with R bicept tenodesis on 08/28/19, PMHx of COPD, former smoker, HTN, anxiety, depression, lip cancer, gastric bypass, polymyalgia rheumatica, bilat THA    PT Comments    Patient alert, finished breakfast. Transferred to sitting EOB with HOB elevated and supervision. Sit <> stand with RW (one handed use), supervision. The patient was able to ambulate in room ~49ft with step to gait pattern, CGA. improved balance, activity tolerance and decreased SOB noted this session, though patient reported she is still not returned to baseline for breathing. HR in 120s with ambulation, spO2 at least 90%, recovers quickly upon sitting. PT and pt discussed use of WC for safe entry of home, and to take several walks daily in home to continue to improve activity tolerance. Overall the patient demonstrated progression towards goals. She would benefit from further skilled PT intervention to return to PLOF.        Follow Up Recommendations  Home health PT;Supervision/Assistance - 24 hour     Equipment Recommendations  None recommended by PT    Recommendations for Other Services       Precautions / Restrictions Precautions Precautions: Shoulder;Fall Type of Shoulder Precautions: watch vitals Shoulder Interventions: Shoulder sling/immobilizer;Shoulder abduction pillow;At all times;Off for dressing/bathing/exercises Precaution Booklet Issued: Yes (comment) Restrictions Weight Bearing Restrictions: Yes RUE Weight Bearing: Non weight bearing Other Position/Activity Restrictions: PROM shoulder FF 0-90*, ER 0-30*, PROM elbow flex/ext ONLY, NO AROM shoulder/elbow, hand/wrist/fingers AROM OK, pendulums OK    Mobility  Bed Mobility Overal bed mobility: Needs Assistance Bed Mobility: Supine to Sit     Supine to sit:  Supervision;HOB elevated        Transfers Overall transfer level: Needs assistance Equipment used: Rolling walker (2 wheeled) Transfers: Sit to/from Stand Sit to Stand: Supervision            Ambulation/Gait Ambulation/Gait assistance: Min guard Gait Distance (Feet): 20 Feet Assistive device: Rolling walker (2 wheeled)       General Gait Details: Pt utilizes RW with L hand, step to gait pattern. improved balance, activity tolerance and decreased SOB noted this session.   Stairs             Wheelchair Mobility    Modified Rankin (Stroke Patients Only)       Balance Overall balance assessment: Needs assistance Sitting-balance support: Feet supported Sitting balance-Leahy Scale: Good       Standing balance-Leahy Scale: Fair                              Cognition Arousal/Alertness: Awake/alert Behavior During Therapy: WFL for tasks assessed/performed Overall Cognitive Status: Within Functional Limits for tasks assessed                                        Exercises Other Exercises Other Exercises: Pt instructed in use of wheelchair to get into home, short walks in home to tolerance during the day    General Comments        Pertinent Vitals/Pain Pain Assessment: 0-10 Pain Score: 6  Pain Location: shoulder Pain Descriptors / Indicators: Aching;Sore Pain Intervention(s): Limited activity within patient's tolerance;Monitored during session;Repositioned    Home Living  Prior Function            PT Goals (current goals can now be found in the care plan section) Progress towards PT goals: Progressing toward goals    Frequency    BID      PT Plan Current plan remains appropriate    Co-evaluation              AM-PAC PT "6 Clicks" Mobility   Outcome Measure  Help needed turning from your back to your side while in a flat bed without using bedrails?: A Little Help needed  moving from lying on your back to sitting on the side of a flat bed without using bedrails?: A Little Help needed moving to and from a bed to a chair (including a wheelchair)?: A Little Help needed standing up from a chair using your arms (e.g., wheelchair or bedside chair)?: A Little Help needed to walk in hospital room?: A Little Help needed climbing 3-5 steps with a railing? : A Lot 6 Click Score: 17    End of Session Equipment Utilized During Treatment: Gait belt Activity Tolerance: Patient tolerated treatment well Patient left: in chair;with call bell/phone within reach Nurse Communication: Mobility status PT Visit Diagnosis: Other abnormalities of gait and mobility (R26.89);Pain;Muscle weakness (generalized) (M62.81) Pain - Right/Left: Right Pain - part of body: Shoulder     Time: [id number] PT Time Calculation (min) (ACUTE ONLY): 23 min  Charges:  $Therapeutic Exercise: 8-22 mins                     Lieutenant Diego PT, DPT 9:48 AM,08/29/19

## 2019-08-31 DIAGNOSIS — D649 Anemia, unspecified: Secondary | ICD-10-CM | POA: Diagnosis not present

## 2019-08-31 DIAGNOSIS — M533 Sacrococcygeal disorders, not elsewhere classified: Secondary | ICD-10-CM | POA: Diagnosis not present

## 2019-08-31 DIAGNOSIS — E538 Deficiency of other specified B group vitamins: Secondary | ICD-10-CM | POA: Diagnosis not present

## 2019-08-31 DIAGNOSIS — I10 Essential (primary) hypertension: Secondary | ICD-10-CM | POA: Diagnosis not present

## 2019-08-31 DIAGNOSIS — G4700 Insomnia, unspecified: Secondary | ICD-10-CM | POA: Diagnosis not present

## 2019-08-31 DIAGNOSIS — K219 Gastro-esophageal reflux disease without esophagitis: Secondary | ICD-10-CM | POA: Diagnosis not present

## 2019-08-31 DIAGNOSIS — F329 Major depressive disorder, single episode, unspecified: Secondary | ICD-10-CM | POA: Diagnosis not present

## 2019-08-31 DIAGNOSIS — Z471 Aftercare following joint replacement surgery: Secondary | ICD-10-CM | POA: Diagnosis not present

## 2019-08-31 DIAGNOSIS — E039 Hypothyroidism, unspecified: Secondary | ICD-10-CM | POA: Diagnosis not present

## 2019-09-04 ENCOUNTER — Other Ambulatory Visit: Payer: Self-pay | Admitting: Psychiatry

## 2019-09-04 DIAGNOSIS — K219 Gastro-esophageal reflux disease without esophagitis: Secondary | ICD-10-CM | POA: Diagnosis not present

## 2019-09-04 DIAGNOSIS — G4700 Insomnia, unspecified: Secondary | ICD-10-CM | POA: Diagnosis not present

## 2019-09-04 DIAGNOSIS — D649 Anemia, unspecified: Secondary | ICD-10-CM | POA: Diagnosis not present

## 2019-09-04 DIAGNOSIS — F5105 Insomnia due to other mental disorder: Secondary | ICD-10-CM

## 2019-09-04 DIAGNOSIS — I10 Essential (primary) hypertension: Secondary | ICD-10-CM | POA: Diagnosis not present

## 2019-09-04 DIAGNOSIS — E039 Hypothyroidism, unspecified: Secondary | ICD-10-CM | POA: Diagnosis not present

## 2019-09-04 DIAGNOSIS — F329 Major depressive disorder, single episode, unspecified: Secondary | ICD-10-CM | POA: Diagnosis not present

## 2019-09-04 DIAGNOSIS — M533 Sacrococcygeal disorders, not elsewhere classified: Secondary | ICD-10-CM | POA: Diagnosis not present

## 2019-09-04 DIAGNOSIS — Z471 Aftercare following joint replacement surgery: Secondary | ICD-10-CM | POA: Diagnosis not present

## 2019-09-04 DIAGNOSIS — E538 Deficiency of other specified B group vitamins: Secondary | ICD-10-CM | POA: Diagnosis not present

## 2019-09-06 DIAGNOSIS — M533 Sacrococcygeal disorders, not elsewhere classified: Secondary | ICD-10-CM | POA: Diagnosis not present

## 2019-09-06 DIAGNOSIS — I10 Essential (primary) hypertension: Secondary | ICD-10-CM | POA: Diagnosis not present

## 2019-09-06 DIAGNOSIS — D649 Anemia, unspecified: Secondary | ICD-10-CM | POA: Diagnosis not present

## 2019-09-06 DIAGNOSIS — K219 Gastro-esophageal reflux disease without esophagitis: Secondary | ICD-10-CM | POA: Diagnosis not present

## 2019-09-06 DIAGNOSIS — E039 Hypothyroidism, unspecified: Secondary | ICD-10-CM | POA: Diagnosis not present

## 2019-09-06 DIAGNOSIS — Z471 Aftercare following joint replacement surgery: Secondary | ICD-10-CM | POA: Diagnosis not present

## 2019-09-06 DIAGNOSIS — G4700 Insomnia, unspecified: Secondary | ICD-10-CM | POA: Diagnosis not present

## 2019-09-06 DIAGNOSIS — E538 Deficiency of other specified B group vitamins: Secondary | ICD-10-CM | POA: Diagnosis not present

## 2019-09-06 DIAGNOSIS — F329 Major depressive disorder, single episode, unspecified: Secondary | ICD-10-CM | POA: Diagnosis not present

## 2019-09-08 DIAGNOSIS — F329 Major depressive disorder, single episode, unspecified: Secondary | ICD-10-CM | POA: Diagnosis not present

## 2019-09-08 DIAGNOSIS — E538 Deficiency of other specified B group vitamins: Secondary | ICD-10-CM | POA: Diagnosis not present

## 2019-09-08 DIAGNOSIS — E039 Hypothyroidism, unspecified: Secondary | ICD-10-CM | POA: Diagnosis not present

## 2019-09-08 DIAGNOSIS — Z471 Aftercare following joint replacement surgery: Secondary | ICD-10-CM | POA: Diagnosis not present

## 2019-09-08 DIAGNOSIS — D649 Anemia, unspecified: Secondary | ICD-10-CM | POA: Diagnosis not present

## 2019-09-08 DIAGNOSIS — M533 Sacrococcygeal disorders, not elsewhere classified: Secondary | ICD-10-CM | POA: Diagnosis not present

## 2019-09-08 DIAGNOSIS — K219 Gastro-esophageal reflux disease without esophagitis: Secondary | ICD-10-CM | POA: Diagnosis not present

## 2019-09-08 DIAGNOSIS — I10 Essential (primary) hypertension: Secondary | ICD-10-CM | POA: Diagnosis not present

## 2019-09-08 DIAGNOSIS — G4700 Insomnia, unspecified: Secondary | ICD-10-CM | POA: Diagnosis not present

## 2019-09-11 DIAGNOSIS — F329 Major depressive disorder, single episode, unspecified: Secondary | ICD-10-CM | POA: Diagnosis not present

## 2019-09-11 DIAGNOSIS — I10 Essential (primary) hypertension: Secondary | ICD-10-CM | POA: Diagnosis not present

## 2019-09-11 DIAGNOSIS — Z471 Aftercare following joint replacement surgery: Secondary | ICD-10-CM | POA: Diagnosis not present

## 2019-09-11 DIAGNOSIS — K219 Gastro-esophageal reflux disease without esophagitis: Secondary | ICD-10-CM | POA: Diagnosis not present

## 2019-09-11 DIAGNOSIS — E039 Hypothyroidism, unspecified: Secondary | ICD-10-CM | POA: Diagnosis not present

## 2019-09-11 DIAGNOSIS — D649 Anemia, unspecified: Secondary | ICD-10-CM | POA: Diagnosis not present

## 2019-09-11 DIAGNOSIS — M533 Sacrococcygeal disorders, not elsewhere classified: Secondary | ICD-10-CM | POA: Diagnosis not present

## 2019-09-11 DIAGNOSIS — E538 Deficiency of other specified B group vitamins: Secondary | ICD-10-CM | POA: Diagnosis not present

## 2019-09-11 DIAGNOSIS — G4700 Insomnia, unspecified: Secondary | ICD-10-CM | POA: Diagnosis not present

## 2019-09-12 ENCOUNTER — Other Ambulatory Visit: Payer: Self-pay

## 2019-09-12 ENCOUNTER — Encounter: Payer: Self-pay | Admitting: Psychiatry

## 2019-09-12 ENCOUNTER — Ambulatory Visit (INDEPENDENT_AMBULATORY_CARE_PROVIDER_SITE_OTHER): Payer: Medicare HMO | Admitting: Psychiatry

## 2019-09-12 DIAGNOSIS — F411 Generalized anxiety disorder: Secondary | ICD-10-CM

## 2019-09-12 DIAGNOSIS — F3342 Major depressive disorder, recurrent, in full remission: Secondary | ICD-10-CM

## 2019-09-12 DIAGNOSIS — F5105 Insomnia due to other mental disorder: Secondary | ICD-10-CM | POA: Diagnosis not present

## 2019-09-12 MED ORDER — DULOXETINE HCL 30 MG PO CPEP: 30.0000 mg | ORAL_CAPSULE | Freq: Every day | ORAL | 1 refills | Status: DC

## 2019-09-12 MED ORDER — DULOXETINE HCL 60 MG PO CPEP: ORAL_CAPSULE | ORAL | 1 refills | Status: DC
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
## 2019-09-13 DIAGNOSIS — D649 Anemia, unspecified: Secondary | ICD-10-CM | POA: Diagnosis not present

## 2019-09-13 DIAGNOSIS — G4700 Insomnia, unspecified: Secondary | ICD-10-CM | POA: Diagnosis not present

## 2019-09-13 DIAGNOSIS — F329 Major depressive disorder, single episode, unspecified: Secondary | ICD-10-CM | POA: Diagnosis not present

## 2019-09-13 DIAGNOSIS — E538 Deficiency of other specified B group vitamins: Secondary | ICD-10-CM | POA: Diagnosis not present

## 2019-09-13 DIAGNOSIS — M533 Sacrococcygeal disorders, not elsewhere classified: Secondary | ICD-10-CM | POA: Diagnosis not present

## 2019-09-13 DIAGNOSIS — Z471 Aftercare following joint replacement surgery: Secondary | ICD-10-CM | POA: Diagnosis not present

## 2019-09-13 DIAGNOSIS — K219 Gastro-esophageal reflux disease without esophagitis: Secondary | ICD-10-CM | POA: Diagnosis not present

## 2019-09-13 DIAGNOSIS — I10 Essential (primary) hypertension: Secondary | ICD-10-CM | POA: Diagnosis not present

## 2019-09-13 DIAGNOSIS — E039 Hypothyroidism, unspecified: Secondary | ICD-10-CM | POA: Diagnosis not present

## 2019-09-15 DIAGNOSIS — E538 Deficiency of other specified B group vitamins: Secondary | ICD-10-CM | POA: Diagnosis not present

## 2019-09-15 DIAGNOSIS — D649 Anemia, unspecified: Secondary | ICD-10-CM | POA: Diagnosis not present

## 2019-09-15 DIAGNOSIS — G4700 Insomnia, unspecified: Secondary | ICD-10-CM | POA: Diagnosis not present

## 2019-09-15 DIAGNOSIS — F329 Major depressive disorder, single episode, unspecified: Secondary | ICD-10-CM | POA: Diagnosis not present

## 2019-09-15 DIAGNOSIS — Z471 Aftercare following joint replacement surgery: Secondary | ICD-10-CM | POA: Diagnosis not present

## 2019-09-15 DIAGNOSIS — M533 Sacrococcygeal disorders, not elsewhere classified: Secondary | ICD-10-CM | POA: Diagnosis not present

## 2019-09-15 DIAGNOSIS — I10 Essential (primary) hypertension: Secondary | ICD-10-CM | POA: Diagnosis not present

## 2019-09-15 DIAGNOSIS — E039 Hypothyroidism, unspecified: Secondary | ICD-10-CM | POA: Diagnosis not present

## 2019-09-15 DIAGNOSIS — K219 Gastro-esophageal reflux disease without esophagitis: Secondary | ICD-10-CM | POA: Diagnosis not present

## 2019-09-17 ENCOUNTER — Encounter: Payer: Self-pay | Admitting: *Deleted

## 2019-09-18 DIAGNOSIS — G5603 Carpal tunnel syndrome, bilateral upper limbs: Secondary | ICD-10-CM | POA: Diagnosis not present

## 2019-09-19 DIAGNOSIS — E538 Deficiency of other specified B group vitamins: Secondary | ICD-10-CM | POA: Diagnosis not present

## 2019-09-19 DIAGNOSIS — G4700 Insomnia, unspecified: Secondary | ICD-10-CM | POA: Diagnosis not present

## 2019-09-19 DIAGNOSIS — M533 Sacrococcygeal disorders, not elsewhere classified: Secondary | ICD-10-CM | POA: Diagnosis not present

## 2019-09-19 DIAGNOSIS — Z471 Aftercare following joint replacement surgery: Secondary | ICD-10-CM | POA: Diagnosis not present

## 2019-09-19 DIAGNOSIS — F329 Major depressive disorder, single episode, unspecified: Secondary | ICD-10-CM | POA: Diagnosis not present

## 2019-09-19 DIAGNOSIS — K219 Gastro-esophageal reflux disease without esophagitis: Secondary | ICD-10-CM | POA: Diagnosis not present

## 2019-09-19 DIAGNOSIS — I10 Essential (primary) hypertension: Secondary | ICD-10-CM | POA: Diagnosis not present

## 2019-09-19 DIAGNOSIS — E039 Hypothyroidism, unspecified: Secondary | ICD-10-CM | POA: Diagnosis not present

## 2019-09-19 DIAGNOSIS — D649 Anemia, unspecified: Secondary | ICD-10-CM | POA: Diagnosis not present

## 2019-09-21 DIAGNOSIS — I10 Essential (primary) hypertension: Secondary | ICD-10-CM | POA: Diagnosis not present

## 2019-09-21 DIAGNOSIS — D649 Anemia, unspecified: Secondary | ICD-10-CM | POA: Diagnosis not present

## 2019-09-21 DIAGNOSIS — K219 Gastro-esophageal reflux disease without esophagitis: Secondary | ICD-10-CM | POA: Diagnosis not present

## 2019-09-21 DIAGNOSIS — G4700 Insomnia, unspecified: Secondary | ICD-10-CM | POA: Diagnosis not present

## 2019-09-21 DIAGNOSIS — E039 Hypothyroidism, unspecified: Secondary | ICD-10-CM | POA: Diagnosis not present

## 2019-09-21 DIAGNOSIS — Z471 Aftercare following joint replacement surgery: Secondary | ICD-10-CM | POA: Diagnosis not present

## 2019-09-21 DIAGNOSIS — M533 Sacrococcygeal disorders, not elsewhere classified: Secondary | ICD-10-CM | POA: Diagnosis not present

## 2019-09-21 DIAGNOSIS — E538 Deficiency of other specified B group vitamins: Secondary | ICD-10-CM | POA: Diagnosis not present

## 2019-09-21 DIAGNOSIS — F329 Major depressive disorder, single episode, unspecified: Secondary | ICD-10-CM | POA: Diagnosis not present

## 2019-09-25 ENCOUNTER — Ambulatory Visit: Payer: Self-pay

## 2019-09-25 DIAGNOSIS — Z471 Aftercare following joint replacement surgery: Secondary | ICD-10-CM | POA: Diagnosis not present

## 2019-09-25 DIAGNOSIS — M533 Sacrococcygeal disorders, not elsewhere classified: Secondary | ICD-10-CM | POA: Diagnosis not present

## 2019-09-25 DIAGNOSIS — D649 Anemia, unspecified: Secondary | ICD-10-CM | POA: Diagnosis not present

## 2019-09-25 DIAGNOSIS — E538 Deficiency of other specified B group vitamins: Secondary | ICD-10-CM | POA: Diagnosis not present

## 2019-09-25 DIAGNOSIS — I10 Essential (primary) hypertension: Secondary | ICD-10-CM | POA: Diagnosis not present

## 2019-09-25 DIAGNOSIS — K219 Gastro-esophageal reflux disease without esophagitis: Secondary | ICD-10-CM | POA: Diagnosis not present

## 2019-09-25 DIAGNOSIS — G4700 Insomnia, unspecified: Secondary | ICD-10-CM | POA: Diagnosis not present

## 2019-09-25 DIAGNOSIS — F329 Major depressive disorder, single episode, unspecified: Secondary | ICD-10-CM | POA: Diagnosis not present

## 2019-09-25 DIAGNOSIS — E039 Hypothyroidism, unspecified: Secondary | ICD-10-CM | POA: Diagnosis not present

## 2019-09-27 DIAGNOSIS — I10 Essential (primary) hypertension: Secondary | ICD-10-CM | POA: Diagnosis not present

## 2019-09-27 DIAGNOSIS — Z471 Aftercare following joint replacement surgery: Secondary | ICD-10-CM | POA: Diagnosis not present

## 2019-09-27 DIAGNOSIS — D649 Anemia, unspecified: Secondary | ICD-10-CM | POA: Diagnosis not present

## 2019-09-27 DIAGNOSIS — F329 Major depressive disorder, single episode, unspecified: Secondary | ICD-10-CM | POA: Diagnosis not present

## 2019-09-27 DIAGNOSIS — E039 Hypothyroidism, unspecified: Secondary | ICD-10-CM | POA: Diagnosis not present

## 2019-09-27 DIAGNOSIS — G4700 Insomnia, unspecified: Secondary | ICD-10-CM | POA: Diagnosis not present

## 2019-09-27 DIAGNOSIS — E538 Deficiency of other specified B group vitamins: Secondary | ICD-10-CM | POA: Diagnosis not present

## 2019-09-27 DIAGNOSIS — M533 Sacrococcygeal disorders, not elsewhere classified: Secondary | ICD-10-CM | POA: Diagnosis not present

## 2019-09-27 DIAGNOSIS — K219 Gastro-esophageal reflux disease without esophagitis: Secondary | ICD-10-CM | POA: Diagnosis not present

## 2019-09-28 DIAGNOSIS — M25311 Other instability, right shoulder: Secondary | ICD-10-CM | POA: Diagnosis not present

## 2019-09-30 DIAGNOSIS — K219 Gastro-esophageal reflux disease without esophagitis: Secondary | ICD-10-CM | POA: Diagnosis not present

## 2019-09-30 DIAGNOSIS — E538 Deficiency of other specified B group vitamins: Secondary | ICD-10-CM | POA: Diagnosis not present

## 2019-09-30 DIAGNOSIS — F329 Major depressive disorder, single episode, unspecified: Secondary | ICD-10-CM | POA: Diagnosis not present

## 2019-09-30 DIAGNOSIS — I10 Essential (primary) hypertension: Secondary | ICD-10-CM | POA: Diagnosis not present

## 2019-09-30 DIAGNOSIS — D649 Anemia, unspecified: Secondary | ICD-10-CM | POA: Diagnosis not present

## 2019-09-30 DIAGNOSIS — E039 Hypothyroidism, unspecified: Secondary | ICD-10-CM | POA: Diagnosis not present

## 2019-09-30 DIAGNOSIS — G4700 Insomnia, unspecified: Secondary | ICD-10-CM | POA: Diagnosis not present

## 2019-09-30 DIAGNOSIS — Z471 Aftercare following joint replacement surgery: Secondary | ICD-10-CM | POA: Diagnosis not present

## 2019-09-30 DIAGNOSIS — M533 Sacrococcygeal disorders, not elsewhere classified: Secondary | ICD-10-CM | POA: Diagnosis not present

## 2019-10-02 DIAGNOSIS — M533 Sacrococcygeal disorders, not elsewhere classified: Secondary | ICD-10-CM | POA: Diagnosis not present

## 2019-10-02 DIAGNOSIS — K219 Gastro-esophageal reflux disease without esophagitis: Secondary | ICD-10-CM | POA: Diagnosis not present

## 2019-10-02 DIAGNOSIS — E538 Deficiency of other specified B group vitamins: Secondary | ICD-10-CM | POA: Diagnosis not present

## 2019-10-02 DIAGNOSIS — F329 Major depressive disorder, single episode, unspecified: Secondary | ICD-10-CM | POA: Diagnosis not present

## 2019-10-02 DIAGNOSIS — G4700 Insomnia, unspecified: Secondary | ICD-10-CM | POA: Diagnosis not present

## 2019-10-02 DIAGNOSIS — E039 Hypothyroidism, unspecified: Secondary | ICD-10-CM | POA: Diagnosis not present

## 2019-10-02 DIAGNOSIS — D649 Anemia, unspecified: Secondary | ICD-10-CM | POA: Diagnosis not present

## 2019-10-02 DIAGNOSIS — I10 Essential (primary) hypertension: Secondary | ICD-10-CM | POA: Diagnosis not present

## 2019-10-02 DIAGNOSIS — Z471 Aftercare following joint replacement surgery: Secondary | ICD-10-CM | POA: Diagnosis not present

## 2019-10-04 DIAGNOSIS — K219 Gastro-esophageal reflux disease without esophagitis: Secondary | ICD-10-CM | POA: Diagnosis not present

## 2019-10-04 DIAGNOSIS — F329 Major depressive disorder, single episode, unspecified: Secondary | ICD-10-CM | POA: Diagnosis not present

## 2019-10-04 DIAGNOSIS — M533 Sacrococcygeal disorders, not elsewhere classified: Secondary | ICD-10-CM | POA: Diagnosis not present

## 2019-10-04 DIAGNOSIS — E538 Deficiency of other specified B group vitamins: Secondary | ICD-10-CM | POA: Diagnosis not present

## 2019-10-04 DIAGNOSIS — E039 Hypothyroidism, unspecified: Secondary | ICD-10-CM | POA: Diagnosis not present

## 2019-10-04 DIAGNOSIS — G4700 Insomnia, unspecified: Secondary | ICD-10-CM | POA: Diagnosis not present

## 2019-10-04 DIAGNOSIS — Z471 Aftercare following joint replacement surgery: Secondary | ICD-10-CM | POA: Diagnosis not present

## 2019-10-04 DIAGNOSIS — D649 Anemia, unspecified: Secondary | ICD-10-CM | POA: Diagnosis not present

## 2019-10-04 DIAGNOSIS — I10 Essential (primary) hypertension: Secondary | ICD-10-CM | POA: Diagnosis not present

## 2019-10-06 ENCOUNTER — Ambulatory Visit: Payer: Medicare HMO | Attending: Internal Medicine

## 2019-10-06 DIAGNOSIS — Z23 Encounter for immunization: Secondary | ICD-10-CM

## 2019-10-06 NOTE — Progress Notes (Signed)
   Covid-19 Vaccination Clinic  Name:  Deborah Jimenez    MRN: [id number] DOB: [birth year]  10/06/2019  [name] was observed post Covid-19 immunization for 15 minutes without incident. She was provided with Vaccine Information Sheet and instruction to access the V-Safe system.   [name] was instructed to call 911 with any severe reactions post vaccine: Marland Kitchen Difficulty breathing  . Swelling of face and throat  . A fast heartbeat  . A bad rash all over body  . Dizziness and weakness   Immunizations Administered    Name Date Dose VIS Date Route   Pfizer COVID-19 Vaccine 10/06/2019 10:31 AM 0.3 mL 07/07/2019 Intramuscular   Manufacturer: [place]   Lot: [id number]   [place]: [id number]

## 2019-10-12 DIAGNOSIS — K219 Gastro-esophageal reflux disease without esophagitis: Secondary | ICD-10-CM | POA: Diagnosis not present

## 2019-10-12 DIAGNOSIS — D649 Anemia, unspecified: Secondary | ICD-10-CM | POA: Diagnosis not present

## 2019-10-12 DIAGNOSIS — Z471 Aftercare following joint replacement surgery: Secondary | ICD-10-CM | POA: Diagnosis not present

## 2019-10-12 DIAGNOSIS — F329 Major depressive disorder, single episode, unspecified: Secondary | ICD-10-CM | POA: Diagnosis not present

## 2019-10-12 DIAGNOSIS — E039 Hypothyroidism, unspecified: Secondary | ICD-10-CM | POA: Diagnosis not present

## 2019-10-12 DIAGNOSIS — M533 Sacrococcygeal disorders, not elsewhere classified: Secondary | ICD-10-CM | POA: Diagnosis not present

## 2019-10-12 DIAGNOSIS — G4700 Insomnia, unspecified: Secondary | ICD-10-CM | POA: Diagnosis not present

## 2019-10-12 DIAGNOSIS — E538 Deficiency of other specified B group vitamins: Secondary | ICD-10-CM | POA: Diagnosis not present

## 2019-10-12 DIAGNOSIS — I10 Essential (primary) hypertension: Secondary | ICD-10-CM | POA: Diagnosis not present

## 2019-10-17 DIAGNOSIS — I10 Essential (primary) hypertension: Secondary | ICD-10-CM | POA: Diagnosis not present

## 2019-10-17 DIAGNOSIS — Z471 Aftercare following joint replacement surgery: Secondary | ICD-10-CM | POA: Diagnosis not present

## 2019-10-17 DIAGNOSIS — F329 Major depressive disorder, single episode, unspecified: Secondary | ICD-10-CM | POA: Diagnosis not present

## 2019-10-17 DIAGNOSIS — M533 Sacrococcygeal disorders, not elsewhere classified: Secondary | ICD-10-CM | POA: Diagnosis not present

## 2019-10-17 DIAGNOSIS — G4700 Insomnia, unspecified: Secondary | ICD-10-CM | POA: Diagnosis not present

## 2019-10-17 DIAGNOSIS — K219 Gastro-esophageal reflux disease without esophagitis: Secondary | ICD-10-CM | POA: Diagnosis not present

## 2019-10-17 DIAGNOSIS — E039 Hypothyroidism, unspecified: Secondary | ICD-10-CM | POA: Diagnosis not present

## 2019-10-17 DIAGNOSIS — E538 Deficiency of other specified B group vitamins: Secondary | ICD-10-CM | POA: Diagnosis not present

## 2019-10-17 DIAGNOSIS — D649 Anemia, unspecified: Secondary | ICD-10-CM | POA: Diagnosis not present

## 2019-10-18 DIAGNOSIS — G5603 Carpal tunnel syndrome, bilateral upper limbs: Secondary | ICD-10-CM | POA: Diagnosis not present

## 2019-10-24 DIAGNOSIS — M533 Sacrococcygeal disorders, not elsewhere classified: Secondary | ICD-10-CM | POA: Diagnosis not present

## 2019-10-24 DIAGNOSIS — G4700 Insomnia, unspecified: Secondary | ICD-10-CM | POA: Diagnosis not present

## 2019-10-24 DIAGNOSIS — D649 Anemia, unspecified: Secondary | ICD-10-CM | POA: Diagnosis not present

## 2019-10-24 DIAGNOSIS — K219 Gastro-esophageal reflux disease without esophagitis: Secondary | ICD-10-CM | POA: Diagnosis not present

## 2019-10-24 DIAGNOSIS — I10 Essential (primary) hypertension: Secondary | ICD-10-CM | POA: Diagnosis not present

## 2019-10-24 DIAGNOSIS — E039 Hypothyroidism, unspecified: Secondary | ICD-10-CM | POA: Diagnosis not present

## 2019-10-24 DIAGNOSIS — E538 Deficiency of other specified B group vitamins: Secondary | ICD-10-CM | POA: Diagnosis not present

## 2019-10-24 DIAGNOSIS — F329 Major depressive disorder, single episode, unspecified: Secondary | ICD-10-CM | POA: Diagnosis not present

## 2019-10-24 DIAGNOSIS — Z471 Aftercare following joint replacement surgery: Secondary | ICD-10-CM | POA: Diagnosis not present

## 2019-10-26 DIAGNOSIS — E039 Hypothyroidism, unspecified: Secondary | ICD-10-CM | POA: Diagnosis not present

## 2019-10-26 DIAGNOSIS — D649 Anemia, unspecified: Secondary | ICD-10-CM | POA: Diagnosis not present

## 2019-10-26 DIAGNOSIS — I10 Essential (primary) hypertension: Secondary | ICD-10-CM | POA: Diagnosis not present

## 2019-10-26 DIAGNOSIS — G4700 Insomnia, unspecified: Secondary | ICD-10-CM | POA: Diagnosis not present

## 2019-10-26 DIAGNOSIS — M533 Sacrococcygeal disorders, not elsewhere classified: Secondary | ICD-10-CM | POA: Diagnosis not present

## 2019-10-26 DIAGNOSIS — K219 Gastro-esophageal reflux disease without esophagitis: Secondary | ICD-10-CM | POA: Diagnosis not present

## 2019-10-26 DIAGNOSIS — Z471 Aftercare following joint replacement surgery: Secondary | ICD-10-CM | POA: Diagnosis not present

## 2019-10-26 DIAGNOSIS — F329 Major depressive disorder, single episode, unspecified: Secondary | ICD-10-CM | POA: Diagnosis not present

## 2019-10-26 DIAGNOSIS — E538 Deficiency of other specified B group vitamins: Secondary | ICD-10-CM | POA: Diagnosis not present

## 2019-10-30 DIAGNOSIS — E538 Deficiency of other specified B group vitamins: Secondary | ICD-10-CM | POA: Diagnosis not present

## 2019-10-30 DIAGNOSIS — I10 Essential (primary) hypertension: Secondary | ICD-10-CM | POA: Diagnosis not present

## 2019-10-30 DIAGNOSIS — M533 Sacrococcygeal disorders, not elsewhere classified: Secondary | ICD-10-CM | POA: Diagnosis not present

## 2019-10-30 DIAGNOSIS — K219 Gastro-esophageal reflux disease without esophagitis: Secondary | ICD-10-CM | POA: Diagnosis not present

## 2019-10-30 DIAGNOSIS — Z471 Aftercare following joint replacement surgery: Secondary | ICD-10-CM | POA: Diagnosis not present

## 2019-10-30 DIAGNOSIS — G4700 Insomnia, unspecified: Secondary | ICD-10-CM | POA: Diagnosis not present

## 2019-10-30 DIAGNOSIS — F329 Major depressive disorder, single episode, unspecified: Secondary | ICD-10-CM | POA: Diagnosis not present

## 2019-10-30 DIAGNOSIS — E039 Hypothyroidism, unspecified: Secondary | ICD-10-CM | POA: Diagnosis not present

## 2019-10-30 DIAGNOSIS — D649 Anemia, unspecified: Secondary | ICD-10-CM | POA: Diagnosis not present

## 2019-10-31 ENCOUNTER — Ambulatory Visit: Payer: Medicare HMO | Attending: Internal Medicine

## 2019-10-31 DIAGNOSIS — Z23 Encounter for immunization: Secondary | ICD-10-CM

## 2019-10-31 NOTE — Progress Notes (Signed)
   Covid-19 Vaccination Clinic  Name:  Deborah Jimenez    MRN: [id number] DOB: 08-17-1955  10/31/2019  Deborah Jimenez was observed post Covid-19 immunization for 30 minutes based on pre-vaccination screening without incident. She was provided with Vaccine Information Sheet and instruction to access the V-Safe system.   Deborah Jimenez was instructed to call 911 with any severe reactions post vaccine: Marland Kitchen Difficulty breathing  . Swelling of face and throat  . A fast heartbeat  . A bad rash all over body  . Dizziness and weakness   Immunizations Administered    Name Date Dose VIS Date Route   Pfizer COVID-19 Vaccine 10/31/2019 11:07 AM 0.3 mL 07/07/2019 Intramuscular   Manufacturer: [place]   Lot: [id number]   [place]: [id number]

## 2019-11-02 DIAGNOSIS — E039 Hypothyroidism, unspecified: Secondary | ICD-10-CM | POA: Diagnosis not present

## 2019-11-02 DIAGNOSIS — G4700 Insomnia, unspecified: Secondary | ICD-10-CM | POA: Diagnosis not present

## 2019-11-02 DIAGNOSIS — F329 Major depressive disorder, single episode, unspecified: Secondary | ICD-10-CM | POA: Diagnosis not present

## 2019-11-02 DIAGNOSIS — D649 Anemia, unspecified: Secondary | ICD-10-CM | POA: Diagnosis not present

## 2019-11-02 DIAGNOSIS — Z471 Aftercare following joint replacement surgery: Secondary | ICD-10-CM | POA: Diagnosis not present

## 2019-11-02 DIAGNOSIS — K219 Gastro-esophageal reflux disease without esophagitis: Secondary | ICD-10-CM | POA: Diagnosis not present

## 2019-11-02 DIAGNOSIS — M533 Sacrococcygeal disorders, not elsewhere classified: Secondary | ICD-10-CM | POA: Diagnosis not present

## 2019-11-02 DIAGNOSIS — E538 Deficiency of other specified B group vitamins: Secondary | ICD-10-CM | POA: Diagnosis not present

## 2019-11-02 DIAGNOSIS — I10 Essential (primary) hypertension: Secondary | ICD-10-CM | POA: Diagnosis not present

## 2019-11-03 ENCOUNTER — Other Ambulatory Visit: Payer: Self-pay | Admitting: Psychiatry

## 2019-11-03 DIAGNOSIS — F5105 Insomnia due to other mental disorder: Secondary | ICD-10-CM

## 2019-11-07 DIAGNOSIS — E538 Deficiency of other specified B group vitamins: Secondary | ICD-10-CM | POA: Diagnosis not present

## 2019-11-07 DIAGNOSIS — K219 Gastro-esophageal reflux disease without esophagitis: Secondary | ICD-10-CM | POA: Diagnosis not present

## 2019-11-07 DIAGNOSIS — E039 Hypothyroidism, unspecified: Secondary | ICD-10-CM | POA: Diagnosis not present

## 2019-11-07 DIAGNOSIS — F329 Major depressive disorder, single episode, unspecified: Secondary | ICD-10-CM | POA: Diagnosis not present

## 2019-11-07 DIAGNOSIS — D649 Anemia, unspecified: Secondary | ICD-10-CM | POA: Diagnosis not present

## 2019-11-07 DIAGNOSIS — Z471 Aftercare following joint replacement surgery: Secondary | ICD-10-CM | POA: Diagnosis not present

## 2019-11-07 DIAGNOSIS — G4700 Insomnia, unspecified: Secondary | ICD-10-CM | POA: Diagnosis not present

## 2019-11-07 DIAGNOSIS — I10 Essential (primary) hypertension: Secondary | ICD-10-CM | POA: Diagnosis not present

## 2019-11-07 DIAGNOSIS — M533 Sacrococcygeal disorders, not elsewhere classified: Secondary | ICD-10-CM | POA: Diagnosis not present

## 2019-11-09 DIAGNOSIS — D649 Anemia, unspecified: Secondary | ICD-10-CM | POA: Diagnosis not present

## 2019-11-09 DIAGNOSIS — G4700 Insomnia, unspecified: Secondary | ICD-10-CM | POA: Diagnosis not present

## 2019-11-09 DIAGNOSIS — E039 Hypothyroidism, unspecified: Secondary | ICD-10-CM | POA: Diagnosis not present

## 2019-11-09 DIAGNOSIS — K219 Gastro-esophageal reflux disease without esophagitis: Secondary | ICD-10-CM | POA: Diagnosis not present

## 2019-11-09 DIAGNOSIS — Z471 Aftercare following joint replacement surgery: Secondary | ICD-10-CM | POA: Diagnosis not present

## 2019-11-09 DIAGNOSIS — M533 Sacrococcygeal disorders, not elsewhere classified: Secondary | ICD-10-CM | POA: Diagnosis not present

## 2019-11-09 DIAGNOSIS — F329 Major depressive disorder, single episode, unspecified: Secondary | ICD-10-CM | POA: Diagnosis not present

## 2019-11-09 DIAGNOSIS — E538 Deficiency of other specified B group vitamins: Secondary | ICD-10-CM | POA: Diagnosis not present

## 2019-11-09 DIAGNOSIS — I10 Essential (primary) hypertension: Secondary | ICD-10-CM | POA: Diagnosis not present

## 2019-11-14 DIAGNOSIS — E039 Hypothyroidism, unspecified: Secondary | ICD-10-CM | POA: Diagnosis not present

## 2019-11-14 DIAGNOSIS — D649 Anemia, unspecified: Secondary | ICD-10-CM | POA: Diagnosis not present

## 2019-11-14 DIAGNOSIS — G4700 Insomnia, unspecified: Secondary | ICD-10-CM | POA: Diagnosis not present

## 2019-11-14 DIAGNOSIS — Z471 Aftercare following joint replacement surgery: Secondary | ICD-10-CM | POA: Diagnosis not present

## 2019-11-14 DIAGNOSIS — K219 Gastro-esophageal reflux disease without esophagitis: Secondary | ICD-10-CM | POA: Diagnosis not present

## 2019-11-14 DIAGNOSIS — M533 Sacrococcygeal disorders, not elsewhere classified: Secondary | ICD-10-CM | POA: Diagnosis not present

## 2019-11-14 DIAGNOSIS — I10 Essential (primary) hypertension: Secondary | ICD-10-CM | POA: Diagnosis not present

## 2019-11-14 DIAGNOSIS — E538 Deficiency of other specified B group vitamins: Secondary | ICD-10-CM | POA: Diagnosis not present

## 2019-11-14 DIAGNOSIS — F329 Major depressive disorder, single episode, unspecified: Secondary | ICD-10-CM | POA: Diagnosis not present

## 2019-11-20 DIAGNOSIS — G5603 Carpal tunnel syndrome, bilateral upper limbs: Secondary | ICD-10-CM | POA: Diagnosis not present

## 2019-11-27 ENCOUNTER — Other Ambulatory Visit: Payer: Self-pay | Admitting: Psychiatry

## 2019-11-27 DIAGNOSIS — F331 Major depressive disorder, recurrent, moderate: Secondary | ICD-10-CM

## 2019-11-27 DIAGNOSIS — F5105 Insomnia due to other mental disorder: Secondary | ICD-10-CM

## 2019-11-28 ENCOUNTER — Other Ambulatory Visit: Payer: Self-pay | Admitting: Internal Medicine

## 2019-11-28 DIAGNOSIS — E039 Hypothyroidism, unspecified: Secondary | ICD-10-CM | POA: Diagnosis not present

## 2019-11-28 DIAGNOSIS — D649 Anemia, unspecified: Secondary | ICD-10-CM | POA: Diagnosis not present

## 2019-11-28 DIAGNOSIS — F331 Major depressive disorder, recurrent, moderate: Secondary | ICD-10-CM | POA: Diagnosis not present

## 2019-11-28 DIAGNOSIS — Z79899 Other long term (current) drug therapy: Secondary | ICD-10-CM | POA: Insufficient documentation

## 2019-11-28 DIAGNOSIS — E538 Deficiency of other specified B group vitamins: Secondary | ICD-10-CM | POA: Diagnosis not present

## 2019-11-28 DIAGNOSIS — E559 Vitamin D deficiency, unspecified: Secondary | ICD-10-CM | POA: Diagnosis not present

## 2019-11-28 DIAGNOSIS — Z0000 Encounter for general adult medical examination without abnormal findings: Secondary | ICD-10-CM | POA: Diagnosis not present

## 2019-11-28 DIAGNOSIS — I10 Essential (primary) hypertension: Secondary | ICD-10-CM | POA: Diagnosis not present

## 2019-11-28 DIAGNOSIS — F411 Generalized anxiety disorder: Secondary | ICD-10-CM | POA: Diagnosis not present

## 2019-11-28 DIAGNOSIS — Z1231 Encounter for screening mammogram for malignant neoplasm of breast: Secondary | ICD-10-CM

## 2019-11-29 DIAGNOSIS — D649 Anemia, unspecified: Secondary | ICD-10-CM | POA: Diagnosis not present

## 2019-11-29 DIAGNOSIS — I10 Essential (primary) hypertension: Secondary | ICD-10-CM | POA: Diagnosis not present

## 2019-11-29 DIAGNOSIS — F329 Major depressive disorder, single episode, unspecified: Secondary | ICD-10-CM | POA: Diagnosis not present

## 2019-11-29 DIAGNOSIS — K219 Gastro-esophageal reflux disease without esophagitis: Secondary | ICD-10-CM | POA: Diagnosis not present

## 2019-11-29 DIAGNOSIS — M533 Sacrococcygeal disorders, not elsewhere classified: Secondary | ICD-10-CM | POA: Diagnosis not present

## 2019-11-29 DIAGNOSIS — G4700 Insomnia, unspecified: Secondary | ICD-10-CM | POA: Diagnosis not present

## 2019-11-29 DIAGNOSIS — E538 Deficiency of other specified B group vitamins: Secondary | ICD-10-CM | POA: Diagnosis not present

## 2019-11-29 DIAGNOSIS — E039 Hypothyroidism, unspecified: Secondary | ICD-10-CM | POA: Diagnosis not present

## 2019-11-29 DIAGNOSIS — Z471 Aftercare following joint replacement surgery: Secondary | ICD-10-CM | POA: Diagnosis not present

## 2019-11-30 DIAGNOSIS — M25311 Other instability, right shoulder: Secondary | ICD-10-CM | POA: Diagnosis not present

## 2019-12-06 DIAGNOSIS — F329 Major depressive disorder, single episode, unspecified: Secondary | ICD-10-CM | POA: Diagnosis not present

## 2019-12-06 DIAGNOSIS — K219 Gastro-esophageal reflux disease without esophagitis: Secondary | ICD-10-CM | POA: Diagnosis not present

## 2019-12-06 DIAGNOSIS — E039 Hypothyroidism, unspecified: Secondary | ICD-10-CM | POA: Diagnosis not present

## 2019-12-06 DIAGNOSIS — G4700 Insomnia, unspecified: Secondary | ICD-10-CM | POA: Diagnosis not present

## 2019-12-06 DIAGNOSIS — M533 Sacrococcygeal disorders, not elsewhere classified: Secondary | ICD-10-CM | POA: Diagnosis not present

## 2019-12-06 DIAGNOSIS — I10 Essential (primary) hypertension: Secondary | ICD-10-CM | POA: Diagnosis not present

## 2019-12-06 DIAGNOSIS — D649 Anemia, unspecified: Secondary | ICD-10-CM | POA: Diagnosis not present

## 2019-12-06 DIAGNOSIS — Z471 Aftercare following joint replacement surgery: Secondary | ICD-10-CM | POA: Diagnosis not present

## 2019-12-06 DIAGNOSIS — E538 Deficiency of other specified B group vitamins: Secondary | ICD-10-CM | POA: Diagnosis not present

## 2019-12-07 ENCOUNTER — Encounter: Payer: Self-pay | Admitting: Psychiatry

## 2019-12-07 ENCOUNTER — Telehealth (INDEPENDENT_AMBULATORY_CARE_PROVIDER_SITE_OTHER): Payer: Medicare HMO | Admitting: Psychiatry

## 2019-12-07 ENCOUNTER — Ambulatory Visit: Payer: Medicare HMO | Admitting: Family Medicine

## 2019-12-07 ENCOUNTER — Other Ambulatory Visit: Payer: Self-pay

## 2019-12-07 DIAGNOSIS — F411 Generalized anxiety disorder: Secondary | ICD-10-CM | POA: Diagnosis not present

## 2019-12-07 DIAGNOSIS — F3342 Major depressive disorder, recurrent, in full remission: Secondary | ICD-10-CM | POA: Diagnosis not present

## 2019-12-07 DIAGNOSIS — F5105 Insomnia due to other mental disorder: Secondary | ICD-10-CM | POA: Diagnosis not present

## 2019-12-07 MED ORDER — AMITRIPTYLINE HCL 100 MG PO TABS: 100.0000 mg | ORAL_TABLET | Freq: Every day | ORAL | 1 refills | Status: DC
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
## 2019-12-08 ENCOUNTER — Ambulatory Visit
Admission: EM | Admit: 2019-12-08 | Discharge: 2019-12-08 | Disposition: A | Discharge status: Home / Self Care | Payer: Medicare HMO | Attending: Family Medicine | Admitting: Family Medicine

## 2019-12-08 ENCOUNTER — Other Ambulatory Visit: Payer: Self-pay

## 2019-12-08 ENCOUNTER — Ambulatory Visit (INDEPENDENT_AMBULATORY_CARE_PROVIDER_SITE_OTHER): Payer: Medicare HMO

## 2019-12-08 ENCOUNTER — Encounter: Payer: Self-pay | Admitting: Emergency Medicine

## 2019-12-08 DIAGNOSIS — S0990XA Unspecified injury of head, initial encounter: Secondary | ICD-10-CM | POA: Diagnosis not present

## 2019-12-08 DIAGNOSIS — S0083XA Contusion of other part of head, initial encounter: Secondary | ICD-10-CM

## 2019-12-08 DIAGNOSIS — R519 Headache, unspecified: Secondary | ICD-10-CM | POA: Diagnosis not present

## 2019-12-08 DIAGNOSIS — W19XXXA Unspecified fall, initial encounter: Secondary | ICD-10-CM

## 2019-12-08 MED ORDER — OXYCODONE-ACETAMINOPHEN 5-325 MG PO TABS: 1.0000 | ORAL_TABLET | Freq: Three times a day (TID) | ORAL | 0 refills | Status: DC | PRN

## 2019-12-08 NOTE — ED Triage Notes (Signed)
Patient states that she fell a week ago.  Patient states that she was was lying in bed and when she rolled over to charge her phone she fell out of the bed and hit her right side of her facial cheek and jaw on the walker handle.  Patient has bruising and swelling on the right side of her face.  Patient c/o pain when moving her jaw and pain in her right facial cheek bone.  Patient denies LOC.

## 2019-12-08 NOTE — Discharge Instructions (Addendum)
Take medication as prescribed. Rest. Drink plenty of fluids. Ice.   Follow up with your primary care physician this week. Return to Urgent care or ER for new or worsening concerns.
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
## 2019-12-08 NOTE — ED Triage Notes (Signed)
Called [employer] and spoke with Vernie Shanks. Auth given for CT Maxillofacial w/o contrast and CT Head w/o contrast. Auth # [id number] valid 12/08/19 to 01/07/20.

## 2020-01-03 ENCOUNTER — Telehealth (INDEPENDENT_AMBULATORY_CARE_PROVIDER_SITE_OTHER): Payer: Medicare HMO | Admitting: Psychiatry

## 2020-01-03 ENCOUNTER — Other Ambulatory Visit: Payer: Self-pay

## 2020-01-03 ENCOUNTER — Encounter: Payer: Self-pay | Admitting: Psychiatry

## 2020-01-03 DIAGNOSIS — F3342 Major depressive disorder, recurrent, in full remission: Secondary | ICD-10-CM

## 2020-01-03 DIAGNOSIS — F5105 Insomnia due to other mental disorder: Secondary | ICD-10-CM | POA: Diagnosis not present

## 2020-01-03 DIAGNOSIS — F411 Generalized anxiety disorder: Secondary | ICD-10-CM

## 2020-01-03 MED ORDER — HYDROXYZINE HCL 10 MG PO TABS: 10.0000 mg | ORAL_TABLET | Freq: Two times a day (BID) | ORAL | 1 refills | Status: DC | PRN
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
## 2020-01-05 ENCOUNTER — Telehealth: Payer: Self-pay

## 2020-01-05 DIAGNOSIS — F411 Generalized anxiety disorder: Secondary | ICD-10-CM

## 2020-01-05 DIAGNOSIS — F3342 Major depressive disorder, recurrent, in full remission: Secondary | ICD-10-CM

## 2020-01-05 NOTE — Telephone Encounter (Signed)
pt wants to go back on the cymbalta states she is having so much joint pain and that the medcation helpped that.

## 2020-01-05 NOTE — Telephone Encounter (Signed)
I spoke with pt. She reports that Cymbalta was helpful with pain and now she has been having hard time moving due to pain. Writer discussed that she was put on Elavil and due to potential interaction with Cymbalta and Elavil, Cymbalta was most likely tapered off of Cymbalta. Writer recommended to discuss medication management with Dr. Shea Evans on her return on Thursday next week. She agreed with that.

## 2020-01-11 MED ORDER — DULOXETINE HCL 30 MG PO CPEP: 30.0000 mg | ORAL_CAPSULE | Freq: Every day | ORAL | 0 refills | Status: DC

## 2020-01-11 MED ORDER — DULOXETINE HCL 60 MG PO CPEP: 60.0000 mg | ORAL_CAPSULE | Freq: Every day | ORAL | 0 refills | Status: DC

## 2020-01-11 NOTE — Telephone Encounter (Signed)
I have spoken to patient - sent cymbalta to pharmacy.Stop Elavil for side effects.

## 2020-02-26 ENCOUNTER — Other Ambulatory Visit: Payer: Self-pay

## 2020-02-26 ENCOUNTER — Encounter: Payer: Self-pay | Admitting: Psychiatry

## 2020-02-26 ENCOUNTER — Telehealth (INDEPENDENT_AMBULATORY_CARE_PROVIDER_SITE_OTHER): Payer: Medicare HMO | Admitting: Psychiatry

## 2020-02-26 DIAGNOSIS — F3342 Major depressive disorder, recurrent, in full remission: Secondary | ICD-10-CM | POA: Diagnosis not present

## 2020-02-26 DIAGNOSIS — F5105 Insomnia due to other mental disorder: Secondary | ICD-10-CM | POA: Diagnosis not present

## 2020-02-26 DIAGNOSIS — F411 Generalized anxiety disorder: Secondary | ICD-10-CM | POA: Diagnosis not present

## 2020-02-26 MED ORDER — BUPROPION HCL 100 MG PO TABS: ORAL_TABLET | ORAL | 0 refills | Status: DC

## 2020-02-26 MED ORDER — ZOLPIDEM TARTRATE 10 MG PO TABS: ORAL_TABLET | ORAL | 0 refills | Status: DC
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
## 2020-04-02 ENCOUNTER — Telehealth: Payer: Medicare HMO | Admitting: Psychiatry

## 2020-04-25 ENCOUNTER — Encounter: Payer: Self-pay | Admitting: Psychiatry

## 2020-04-25 ENCOUNTER — Other Ambulatory Visit: Payer: Self-pay

## 2020-04-25 ENCOUNTER — Telehealth (INDEPENDENT_AMBULATORY_CARE_PROVIDER_SITE_OTHER): Payer: Medicare HMO | Admitting: Psychiatry

## 2020-04-25 DIAGNOSIS — F5105 Insomnia due to other mental disorder: Secondary | ICD-10-CM

## 2020-04-25 DIAGNOSIS — F411 Generalized anxiety disorder: Secondary | ICD-10-CM | POA: Diagnosis not present

## 2020-04-25 DIAGNOSIS — F3342 Major depressive disorder, recurrent, in full remission: Secondary | ICD-10-CM | POA: Diagnosis not present

## 2020-04-25 MED ORDER — DULOXETINE HCL 30 MG PO CPEP: 30.0000 mg | ORAL_CAPSULE | Freq: Every day | ORAL | 0 refills | Status: DC

## 2020-04-25 MED ORDER — DULOXETINE HCL 60 MG PO CPEP: 60.0000 mg | ORAL_CAPSULE | Freq: Every day | ORAL | 0 refills | Status: DC
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
## 2020-05-03 IMAGING — MR MR SHOULDER*R* W/O CM
5 series · 35 of 40 positions shown · non-contrast
Comparison: None.

CLINICAL DATA: Shoulder pain and limited range of motion

EXAM:
MRI OF THE RIGHT SHOULDER WITHOUT CONTRAST
TECHNIQUE: Multiplanar, multisequence MR imaging of the shoulder was performed.
No intravenous contrast was administered.

[Series 3: PD fat-sat · axial · right · 4.0mm · 0.55mm/px · z∈[-55,+60]mm · 8 of 25 slices shown (1 of 2)]
[im 1/25]
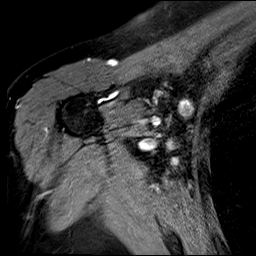
[im 4/25]
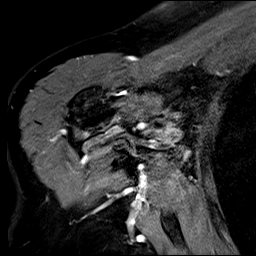
[im 7/25]
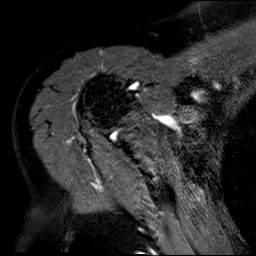
[im 11/25]
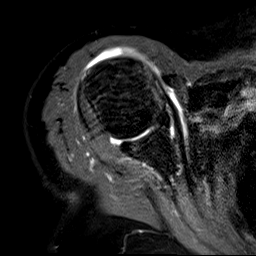
[im 14/25]
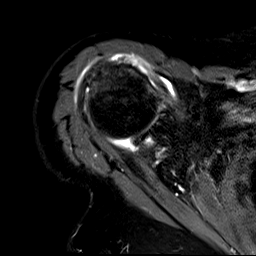
[im 18/25]
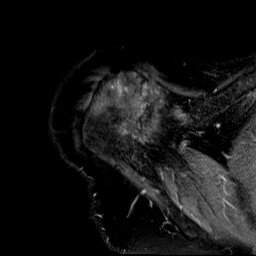
[im 21/25]
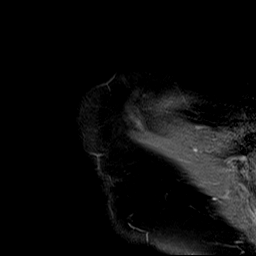
[im 25/25]
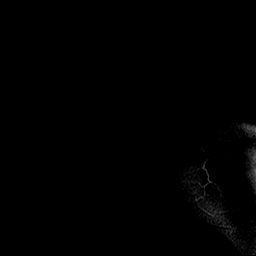

[Series 4: PD fat-sat · oblique · right · 4.0mm · 0.44mm/px · 8 of 22 slices shown (2 of 2)]
[im 1/22]
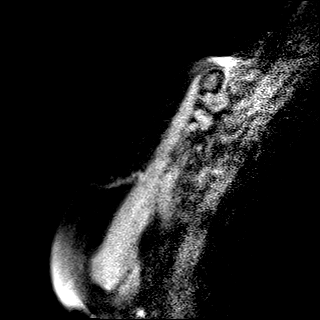
[im 4/22]
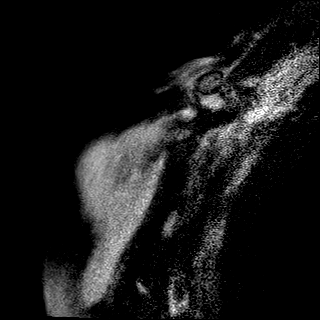
[im 7/22]
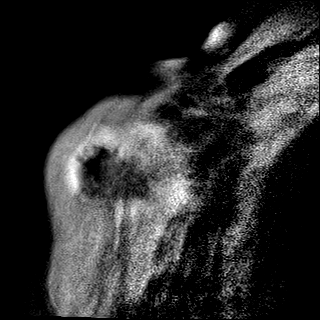
[im 10/22]
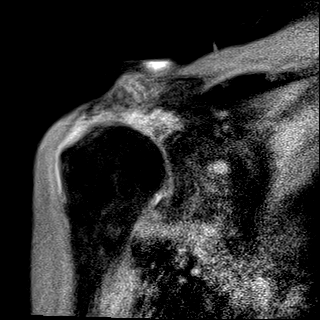
[im 13/22]
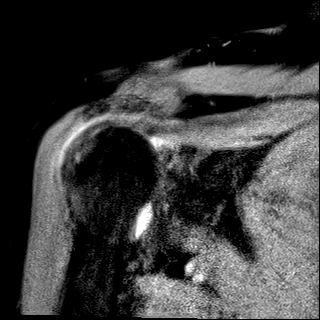
[im 16/22]
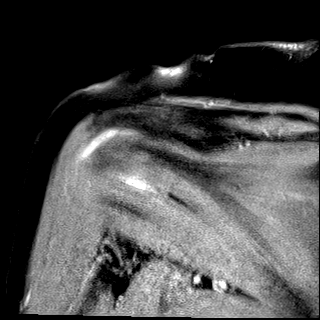
[im 19/22]
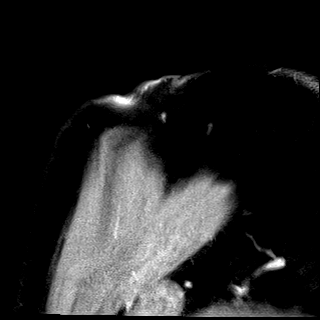
[im 22/22]
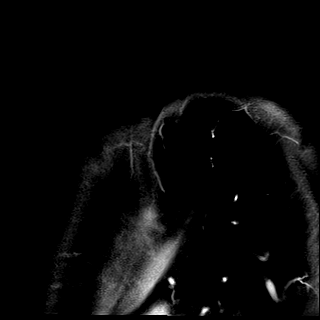

[Series 5: T2 fat-sat · oblique · right · 4.0mm · 0.44mm/px · 8 of 21 slices shown (1 of 2)]
[im 1/21]
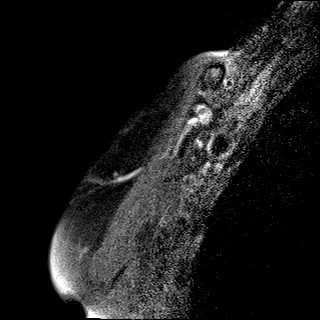
[im 3/21]
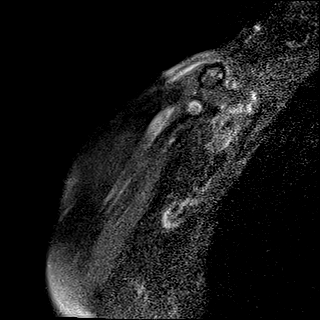
[im 6/21]
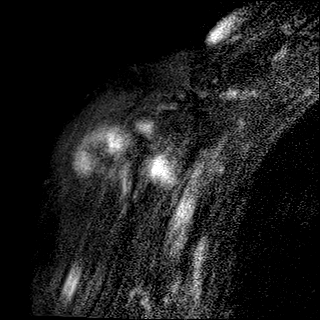
[im 9/21]
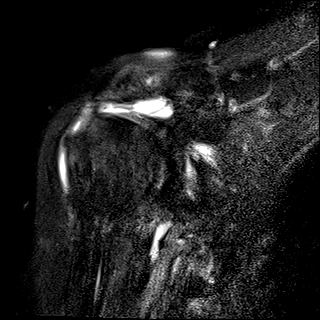
[im 12/21]
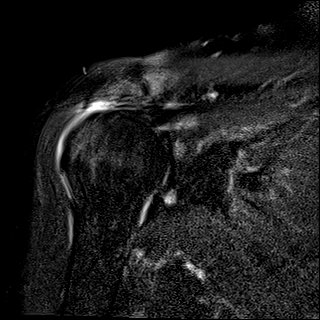
[im 15/21]
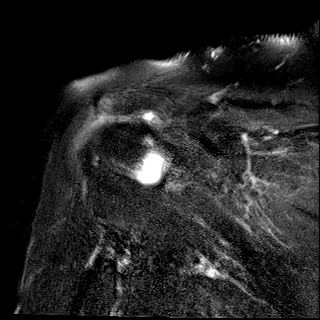
[im 18/21]
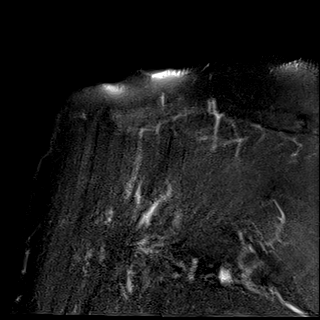
[im 21/21]
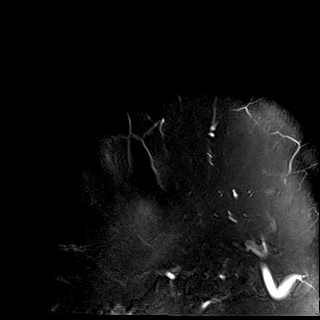

[Series 6: T2 fat-sat · oblique · right · 4.0mm · 0.27mm/px · 8 of 22 slices shown (2 of 2)]
[im 1/22]
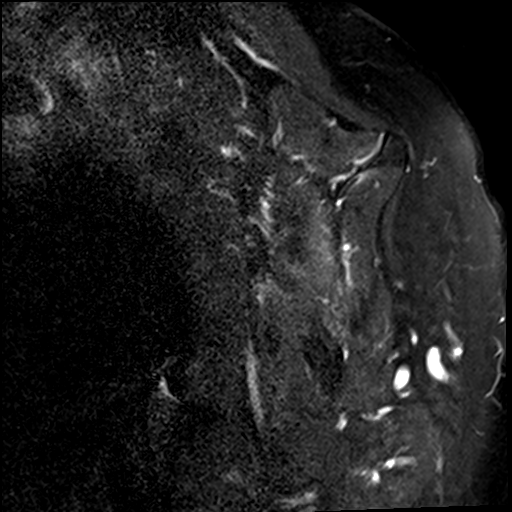
[im 4/22]
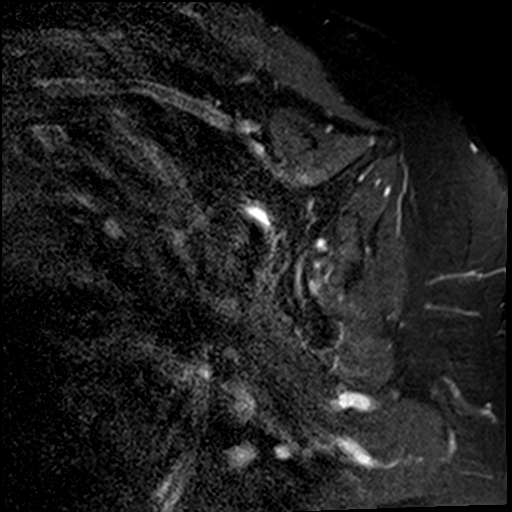
[im 7/22]
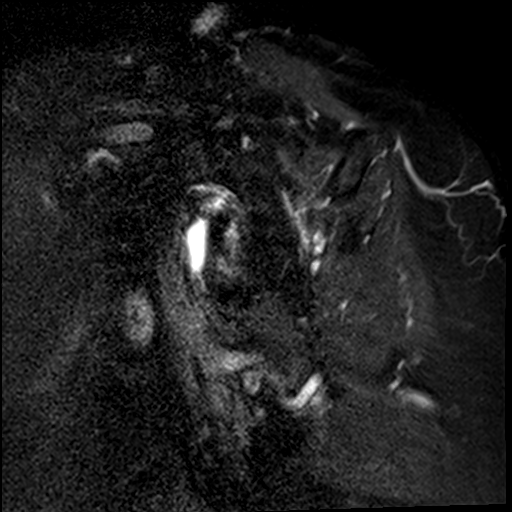
[im 10/22]
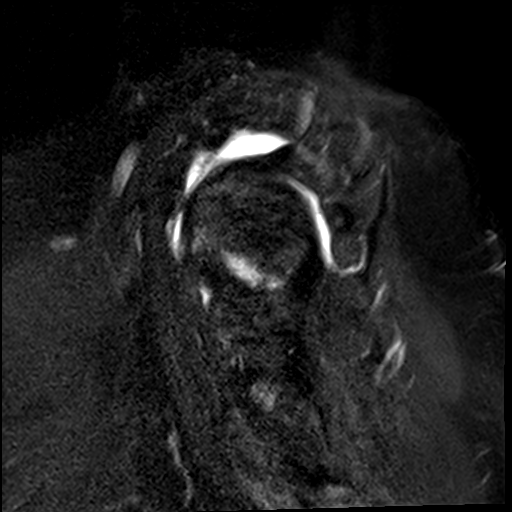
[im 13/22]
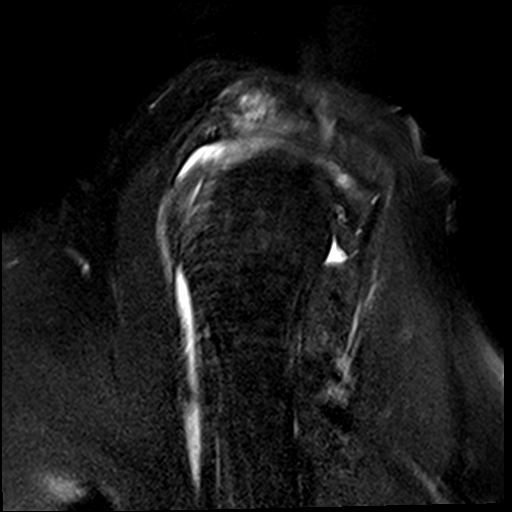
[im 16/22]
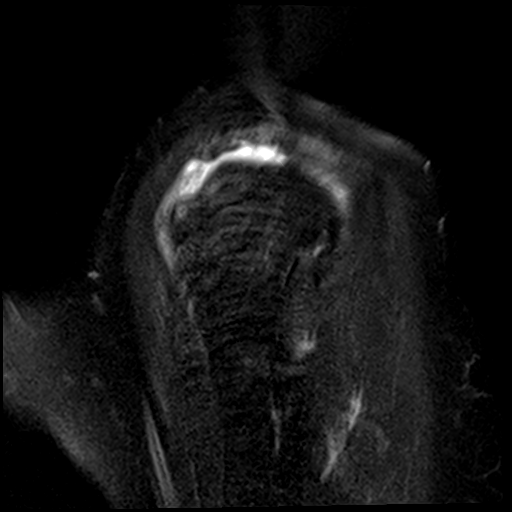
[im 19/22]
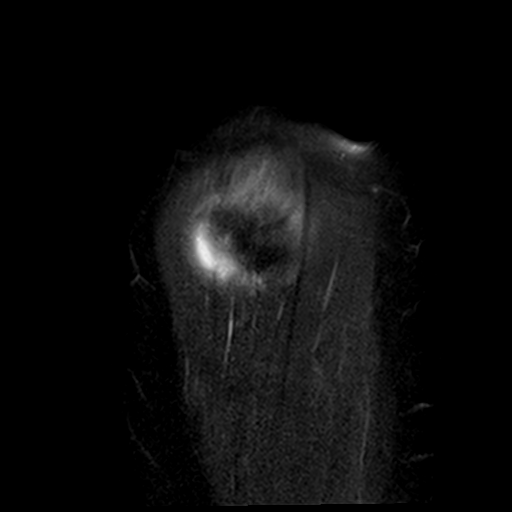
[im 22/22]
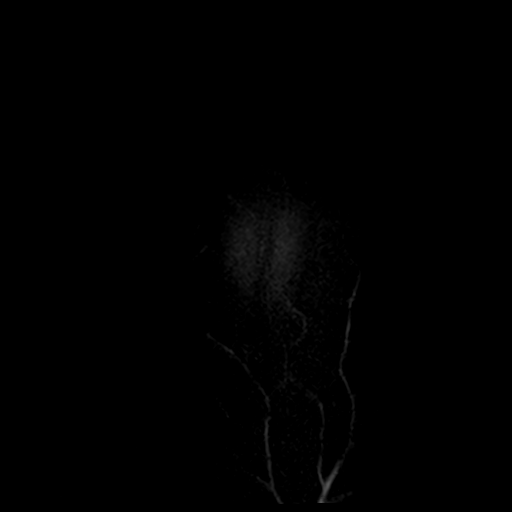

[Series 7: T1 · oblique · right · 4.0mm · 0.44mm/px · 3 of 22 slices shown]
[im 1/22]
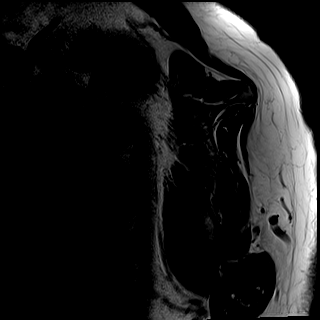
[im 4/22]
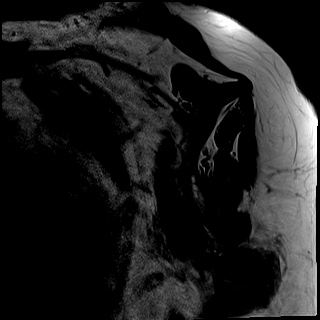
[im 7/22]
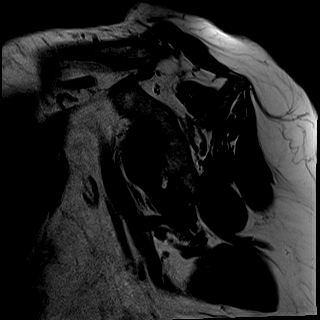

[35 of 40 positions shown; findings below may reference images not displayed]

FINDINGS: Despite efforts by the technologist and patient, severe motion
artifact is present on today's exam and could not be eliminated.
This reduces exam sensitivity and specificity.

Rotator cuff: Full-thickness partial width tear of the anterior
supraspinatus, believed to be retracted about 2.3 cm. The posterior
supraspinatus tendon probably attaches successfully to the humeral
head. Intrasubstance partial tearing of the infraspinatus tendon,
image [DATE]. Thinning of the subscapularis tendon suggesting partial
thickness articular surface tearing.

Muscles:  Minimal supraspinatus and infraspinatus atrophy.

Biceps long head: The intra-articular segment of the long head is
highly indistinct although some of this may be from the motion
artifact. I favor that there is at least moderate biceps
tendinopathy if not a biceps tear in the intra-articular segment.

Acromioclavicular Joint: Moderate spurring and subcortical marrow
edema. Type II acromion. As expected there is fluid in the
subacromial subdeltoid bursa.

Glenohumeral Joint: Small glenohumeral joint effusion. I suspect
moderate degenerative chondral thinning. Subcortical cyst formation
posteriorly and inferiorly in the glenoid.

Labrum:  Indeterminate due to motion.

Bones: No significant extra-articular osseous abnormalities
identified.

Other: No supplemental non-categorized findings.
IMPRESSION: 1. Reduced sensitivity and specificity due to severe motion
artifact.
2. Full-thickness partial width tear of the anterior supraspinatus
tendon retracted about 2.3 cm.
3. Intrasubstance partial tearing of the infraspinatus tendon.
4. Partial thickness articular surface tearing of the subscapularis
tendon.
5. Highly indistinct intra-articular segment of the long head of
biceps compatible with at least moderate tendinopathy.
6. Indeterminate labrum due to motion artifact.
7. Moderate degenerative AC joint arthropathy.
8. Small glenohumeral joint effusion communicating with fluid in the
subacromial subdeltoid bursa.
9. Moderate degenerative chondral thinning in the glenohumeral joint
with subcortical cyst formation in the glenoid.

## 2020-05-20 ENCOUNTER — Other Ambulatory Visit: Payer: Self-pay | Admitting: Psychiatry

## 2020-05-20 DIAGNOSIS — F5105 Insomnia due to other mental disorder: Secondary | ICD-10-CM

## 2020-05-21 ENCOUNTER — Telehealth: Payer: Self-pay

## 2020-05-21 DIAGNOSIS — F5105 Insomnia due to other mental disorder: Secondary | ICD-10-CM

## 2020-05-21 MED ORDER — ZOLPIDEM TARTRATE 10 MG PO TABS: ORAL_TABLET | ORAL | 0 refills | Status: DC
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
## 2020-05-30 ENCOUNTER — Telehealth (INDEPENDENT_AMBULATORY_CARE_PROVIDER_SITE_OTHER): Payer: Medicare HMO | Admitting: Psychiatry

## 2020-05-30 ENCOUNTER — Other Ambulatory Visit: Payer: Self-pay

## 2020-05-30 ENCOUNTER — Encounter: Payer: Self-pay | Admitting: Psychiatry

## 2020-05-30 DIAGNOSIS — F3342 Major depressive disorder, recurrent, in full remission: Secondary | ICD-10-CM

## 2020-05-30 DIAGNOSIS — F5105 Insomnia due to other mental disorder: Secondary | ICD-10-CM | POA: Diagnosis not present

## 2020-05-30 DIAGNOSIS — F411 Generalized anxiety disorder: Secondary | ICD-10-CM | POA: Diagnosis not present

## 2020-05-30 MED ORDER — BUPROPION HCL 100 MG PO TABS: ORAL_TABLET | ORAL | 0 refills | Status: DC
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
## 2020-05-31 DIAGNOSIS — F331 Major depressive disorder, recurrent, moderate: Secondary | ICD-10-CM | POA: Diagnosis not present

## 2020-05-31 DIAGNOSIS — E538 Deficiency of other specified B group vitamins: Secondary | ICD-10-CM | POA: Diagnosis not present

## 2020-05-31 DIAGNOSIS — E790 Hyperuricemia without signs of inflammatory arthritis and tophaceous disease: Secondary | ICD-10-CM | POA: Diagnosis not present

## 2020-05-31 DIAGNOSIS — Z79899 Other long term (current) drug therapy: Secondary | ICD-10-CM | POA: Diagnosis not present

## 2020-05-31 DIAGNOSIS — I10 Essential (primary) hypertension: Secondary | ICD-10-CM | POA: Diagnosis not present

## 2020-05-31 DIAGNOSIS — F411 Generalized anxiety disorder: Secondary | ICD-10-CM | POA: Diagnosis not present

## 2020-05-31 DIAGNOSIS — E559 Vitamin D deficiency, unspecified: Secondary | ICD-10-CM | POA: Diagnosis not present

## 2020-05-31 DIAGNOSIS — Z0000 Encounter for general adult medical examination without abnormal findings: Secondary | ICD-10-CM | POA: Diagnosis not present

## 2020-05-31 DIAGNOSIS — K219 Gastro-esophageal reflux disease without esophagitis: Secondary | ICD-10-CM | POA: Diagnosis not present

## 2020-05-31 DIAGNOSIS — E039 Hypothyroidism, unspecified: Secondary | ICD-10-CM | POA: Diagnosis not present

## 2020-07-15 ENCOUNTER — Other Ambulatory Visit: Payer: Self-pay

## 2020-07-15 ENCOUNTER — Telehealth (INDEPENDENT_AMBULATORY_CARE_PROVIDER_SITE_OTHER): Payer: Medicare HMO | Admitting: Psychiatry

## 2020-07-15 ENCOUNTER — Encounter: Payer: Self-pay | Admitting: Psychiatry

## 2020-07-15 DIAGNOSIS — F3342 Major depressive disorder, recurrent, in full remission: Secondary | ICD-10-CM

## 2020-07-15 DIAGNOSIS — F5101 Primary insomnia: Secondary | ICD-10-CM

## 2020-07-15 DIAGNOSIS — F411 Generalized anxiety disorder: Secondary | ICD-10-CM | POA: Diagnosis not present
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
## 2020-07-27 DIAGNOSIS — M810 Age-related osteoporosis without current pathological fracture: Secondary | ICD-10-CM | POA: Insufficient documentation

## 2020-08-01 ENCOUNTER — Other Ambulatory Visit: Payer: Self-pay | Admitting: Psychiatry

## 2020-08-01 DIAGNOSIS — F3342 Major depressive disorder, recurrent, in full remission: Secondary | ICD-10-CM

## 2020-08-01 DIAGNOSIS — F411 Generalized anxiety disorder: Secondary | ICD-10-CM

## 2020-08-14 ENCOUNTER — Other Ambulatory Visit: Payer: Self-pay | Admitting: Psychiatry

## 2020-08-14 DIAGNOSIS — F5105 Insomnia due to other mental disorder: Secondary | ICD-10-CM

## 2020-08-27 DIAGNOSIS — M47896 Other spondylosis, lumbar region: Secondary | ICD-10-CM | POA: Diagnosis not present

## 2020-08-27 DIAGNOSIS — M4856XA Collapsed vertebra, not elsewhere classified, lumbar region, initial encounter for fracture: Secondary | ICD-10-CM | POA: Diagnosis not present

## 2020-08-29 ENCOUNTER — Telehealth: Payer: Medicare HMO | Admitting: Psychiatry

## 2020-09-25 DIAGNOSIS — M818 Other osteoporosis without current pathological fracture: Secondary | ICD-10-CM | POA: Diagnosis not present

## 2020-09-25 DIAGNOSIS — M810 Age-related osteoporosis without current pathological fracture: Secondary | ICD-10-CM | POA: Diagnosis not present

## 2020-10-16 DIAGNOSIS — M47896 Other spondylosis, lumbar region: Secondary | ICD-10-CM | POA: Diagnosis not present

## 2020-10-31 ENCOUNTER — Other Ambulatory Visit: Payer: Self-pay | Admitting: Psychiatry

## 2020-10-31 DIAGNOSIS — F5105 Insomnia due to other mental disorder: Secondary | ICD-10-CM

## 2020-10-31 DIAGNOSIS — F411 Generalized anxiety disorder: Secondary | ICD-10-CM

## 2020-10-31 DIAGNOSIS — F3342 Major depressive disorder, recurrent, in full remission: Secondary | ICD-10-CM

## 2020-11-01 ENCOUNTER — Other Ambulatory Visit: Payer: Self-pay | Admitting: Psychiatry

## 2020-11-01 DIAGNOSIS — F411 Generalized anxiety disorder: Secondary | ICD-10-CM

## 2020-11-01 DIAGNOSIS — F3342 Major depressive disorder, recurrent, in full remission: Secondary | ICD-10-CM

## 2020-11-10 ENCOUNTER — Other Ambulatory Visit: Payer: Self-pay | Admitting: Psychiatry

## 2020-11-10 DIAGNOSIS — F3342 Major depressive disorder, recurrent, in full remission: Secondary | ICD-10-CM

## 2020-11-10 DIAGNOSIS — F411 Generalized anxiety disorder: Secondary | ICD-10-CM

## 2020-11-10 DIAGNOSIS — F5105 Insomnia due to other mental disorder: Secondary | ICD-10-CM

## 2020-11-11 DIAGNOSIS — M461 Sacroiliitis, not elsewhere classified: Secondary | ICD-10-CM | POA: Diagnosis not present

## 2020-11-18 ENCOUNTER — Ambulatory Visit
Admission: RE | Admit: 2020-11-18 | Discharge: 2020-11-18 | Disposition: A | Discharge status: Home / Self Care | Payer: Medicare HMO | Source: Ambulatory Visit | Attending: Internal Medicine | Admitting: Internal Medicine

## 2020-11-18 ENCOUNTER — Other Ambulatory Visit: Payer: Self-pay

## 2020-11-18 DIAGNOSIS — Z1231 Encounter for screening mammogram for malignant neoplasm of breast: Secondary | ICD-10-CM | POA: Insufficient documentation

## 2020-11-20 ENCOUNTER — Telehealth (INDEPENDENT_AMBULATORY_CARE_PROVIDER_SITE_OTHER): Payer: Medicare HMO | Admitting: Psychiatry

## 2020-11-20 ENCOUNTER — Encounter: Payer: Self-pay | Admitting: Psychiatry

## 2020-11-20 ENCOUNTER — Other Ambulatory Visit: Payer: Self-pay

## 2020-11-20 DIAGNOSIS — F330 Major depressive disorder, recurrent, mild: Secondary | ICD-10-CM | POA: Diagnosis not present

## 2020-11-20 DIAGNOSIS — F5101 Primary insomnia: Secondary | ICD-10-CM | POA: Diagnosis not present

## 2020-11-20 DIAGNOSIS — F411 Generalized anxiety disorder: Secondary | ICD-10-CM

## 2020-11-20 MED ORDER — BUSPIRONE HCL 10 MG PO TABS: 10.0000 mg | ORAL_TABLET | Freq: Two times a day (BID) | ORAL | 1 refills | Status: DC

## 2020-11-20 MED ORDER — BUPROPION HCL 100 MG PO TABS: ORAL_TABLET | ORAL | 0 refills | Status: DC

## 2020-11-20 MED ORDER — DULOXETINE HCL 60 MG PO CPEP: 60.0000 mg | ORAL_CAPSULE | Freq: Every day | ORAL | 0 refills | Status: DC

## 2020-11-20 NOTE — Patient Instructions (Signed)
Buspirone tablets What is this medicine? BUSPIRONE (byoo SPYE rone) is used to treat anxiety disorders. This medicine may be used for other purposes; ask your health care provider or pharmacist if you have questions. COMMON BRAND NAME(S): BuSpar What should I tell my health care provider before I take this medicine? They need to know if you have any of these conditions:  kidney or liver disease  an unusual or allergic reaction to buspirone, other medicines, foods, dyes, or preservatives  pregnant or trying to get pregnant  breast-feeding How should I use this medicine? Take this medicine by mouth with a glass of water. Follow the directions on the prescription label. You may take this medicine with or without food. To ensure that this medicine always works the same way for you, you should take it either always with or always without food. Take your doses at regular intervals. Do not take your medicine more often than directed. Do not stop taking except on the advice of your doctor or health care professional. Talk to your pediatrician regarding the use of this medicine in children. Special care may be needed. Overdosage: If you think you have taken too much of this medicine contact a poison control center or emergency room at once. NOTE: This medicine is only for you. Do not share this medicine with others. What if I miss a dose? If you miss a dose, take it as soon as you can. If it is almost time for your next dose, take only that dose. Do not take double or extra doses. What may interact with this medicine? Do not take this medicine with any of the following medications:  linezolid  MAOIs like Carbex, Eldepryl, Marplan, Nardil, and Parnate  methylene blue  procarbazine This medicine may also interact with the following medications:  diazepam  digoxin  diltiazem  erythromycin  grapefruit juice  haloperidol  medicines for mental depression or mood problems  medicines  for seizures like carbamazepine, phenobarbital and phenytoin  nefazodone  other medications for anxiety  rifampin  ritonavir  some antifungal medicines like itraconazole, ketoconazole, and voriconazole  verapamil  warfarin This list may not describe all possible interactions. Give your health care provider a list of all the medicines, herbs, non-prescription drugs, or dietary supplements you use. Also tell them if you smoke, drink alcohol, or use illegal drugs. Some items may interact with your medicine. What should I watch for while using this medicine? Visit your doctor or health care professional for regular checks on your progress. It may take 1 to 2 weeks before your anxiety gets better. You may get drowsy or dizzy. Do not drive, use machinery, or do anything that needs mental alertness until you know how this drug affects you. Do not stand or sit up quickly, especially if you are an older patient. This reduces the risk of dizzy or fainting spells. Alcohol can make you more drowsy and dizzy. Avoid alcoholic drinks. What side effects may I notice from receiving this medicine? Side effects that you should report to your doctor or health care professional as soon as possible:  blurred vision or other vision changes  chest pain  confusion  difficulty breathing  feelings of hostility or anger  muscle aches and pains  numbness or tingling in hands or feet  ringing in the ears  skin rash and itching  vomiting  weakness Side effects that usually do not require medical attention (report to your doctor or health care professional if they continue or   are bothersome):  disturbed dreams, nightmares  headache  nausea  restlessness or nervousness  sore throat and nasal congestion  stomach upset This list may not describe all possible side effects. Call your doctor for medical advice about side effects. You may report side effects to FDA at 1-800-FDA-1088. Where should I  keep my medicine? Keep out of the reach of children. Store at room temperature below 30 degrees C (86 degrees F). Protect from light. Keep container tightly closed. Throw away any unused medicine after the expiration date. NOTE: This sheet is a summary. It may not cover all possible information. If you have questions about this medicine, talk to your doctor, pharmacist, or health care provider.  2021 Elsevier/Gold Standard (2010-02-20 18:06:11)
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
## 2020-11-27 DIAGNOSIS — M47896 Other spondylosis, lumbar region: Secondary | ICD-10-CM | POA: Diagnosis not present

## 2020-11-27 DIAGNOSIS — M461 Sacroiliitis, not elsewhere classified: Secondary | ICD-10-CM | POA: Diagnosis not present

## 2020-12-02 DIAGNOSIS — M47817 Spondylosis without myelopathy or radiculopathy, lumbosacral region: Secondary | ICD-10-CM | POA: Diagnosis not present

## 2020-12-11 DIAGNOSIS — M47817 Spondylosis without myelopathy or radiculopathy, lumbosacral region: Secondary | ICD-10-CM | POA: Diagnosis not present

## 2020-12-30 DIAGNOSIS — M818 Other osteoporosis without current pathological fracture: Secondary | ICD-10-CM | POA: Diagnosis not present

## 2020-12-31 DIAGNOSIS — Z79899 Other long term (current) drug therapy: Secondary | ICD-10-CM | POA: Diagnosis not present

## 2020-12-31 DIAGNOSIS — K219 Gastro-esophageal reflux disease without esophagitis: Secondary | ICD-10-CM | POA: Diagnosis not present

## 2020-12-31 DIAGNOSIS — I10 Essential (primary) hypertension: Secondary | ICD-10-CM | POA: Diagnosis not present

## 2020-12-31 DIAGNOSIS — M810 Age-related osteoporosis without current pathological fracture: Secondary | ICD-10-CM | POA: Diagnosis not present

## 2020-12-31 DIAGNOSIS — E039 Hypothyroidism, unspecified: Secondary | ICD-10-CM | POA: Diagnosis not present

## 2020-12-31 DIAGNOSIS — F331 Major depressive disorder, recurrent, moderate: Secondary | ICD-10-CM | POA: Diagnosis not present

## 2020-12-31 DIAGNOSIS — M159 Polyosteoarthritis, unspecified: Secondary | ICD-10-CM | POA: Diagnosis not present

## 2020-12-31 DIAGNOSIS — E559 Vitamin D deficiency, unspecified: Secondary | ICD-10-CM | POA: Diagnosis not present

## 2020-12-31 DIAGNOSIS — Z9884 Bariatric surgery status: Secondary | ICD-10-CM | POA: Diagnosis not present

## 2020-12-31 DIAGNOSIS — Z8739 Personal history of other diseases of the musculoskeletal system and connective tissue: Secondary | ICD-10-CM | POA: Diagnosis not present

## 2020-12-31 DIAGNOSIS — F411 Generalized anxiety disorder: Secondary | ICD-10-CM | POA: Diagnosis not present

## 2020-12-31 DIAGNOSIS — E538 Deficiency of other specified B group vitamins: Secondary | ICD-10-CM | POA: Diagnosis not present

## 2021-01-21 ENCOUNTER — Other Ambulatory Visit: Payer: Self-pay | Admitting: Psychiatry

## 2021-01-21 DIAGNOSIS — F330 Major depressive disorder, recurrent, mild: Secondary | ICD-10-CM

## 2021-01-21 DIAGNOSIS — M545 Low back pain, unspecified: Secondary | ICD-10-CM | POA: Diagnosis not present

## 2021-01-21 DIAGNOSIS — M48062 Spinal stenosis, lumbar region with neurogenic claudication: Secondary | ICD-10-CM | POA: Diagnosis not present

## 2021-01-21 DIAGNOSIS — F411 Generalized anxiety disorder: Secondary | ICD-10-CM

## 2021-01-28 ENCOUNTER — Other Ambulatory Visit: Payer: Self-pay | Admitting: Psychiatry

## 2021-01-28 DIAGNOSIS — F3342 Major depressive disorder, recurrent, in full remission: Secondary | ICD-10-CM

## 2021-01-28 DIAGNOSIS — F411 Generalized anxiety disorder: Secondary | ICD-10-CM

## 2021-01-29 ENCOUNTER — Other Ambulatory Visit: Payer: Self-pay | Admitting: Psychiatry

## 2021-01-29 DIAGNOSIS — F411 Generalized anxiety disorder: Secondary | ICD-10-CM

## 2021-01-29 DIAGNOSIS — F330 Major depressive disorder, recurrent, mild: Secondary | ICD-10-CM

## 2021-01-30 ENCOUNTER — Other Ambulatory Visit: Payer: Self-pay | Admitting: Psychiatry

## 2021-01-30 DIAGNOSIS — F5105 Insomnia due to other mental disorder: Secondary | ICD-10-CM

## 2021-01-30 DIAGNOSIS — F330 Major depressive disorder, recurrent, mild: Secondary | ICD-10-CM

## 2021-01-30 DIAGNOSIS — F411 Generalized anxiety disorder: Secondary | ICD-10-CM

## 2021-01-31 ENCOUNTER — Other Ambulatory Visit: Payer: Self-pay | Admitting: Psychiatry

## 2021-02-05 ENCOUNTER — Other Ambulatory Visit: Payer: Self-pay | Admitting: Psychiatry

## 2021-02-05 ENCOUNTER — Telehealth: Payer: Medicare HMO | Admitting: Psychiatry

## 2021-02-05 ENCOUNTER — Telehealth: Payer: Self-pay

## 2021-02-05 DIAGNOSIS — F330 Major depressive disorder, recurrent, mild: Secondary | ICD-10-CM

## 2021-02-05 DIAGNOSIS — M5416 Radiculopathy, lumbar region: Secondary | ICD-10-CM | POA: Diagnosis not present

## 2021-02-05 DIAGNOSIS — F411 Generalized anxiety disorder: Secondary | ICD-10-CM

## 2021-02-05 MED ORDER — DULOXETINE HCL 60 MG PO CPEP: 60.0000 mg | ORAL_CAPSULE | Freq: Every day | ORAL | 0 refills | Status: DC

## 2021-02-05 NOTE — Telephone Encounter (Signed)
pt called states she needs a 90 day supply of the duloxetine. because her insurance will not pay for nothing but a 90 day supply .  this is a Dr. Shea Evans patient. she had to r/s her appt.   The last rx for April did not go threw to the pharmacy.

## 2021-02-05 NOTE — Telephone Encounter (Signed)
ordered

## 2021-02-24 ENCOUNTER — Other Ambulatory Visit: Payer: Self-pay | Admitting: Child and Adolescent Psychiatry

## 2021-02-24 DIAGNOSIS — M791 Myalgia, unspecified site: Secondary | ICD-10-CM | POA: Diagnosis not present

## 2021-02-24 DIAGNOSIS — M545 Low back pain, unspecified: Secondary | ICD-10-CM | POA: Diagnosis not present

## 2021-02-24 DIAGNOSIS — M461 Sacroiliitis, not elsewhere classified: Secondary | ICD-10-CM | POA: Diagnosis not present

## 2021-02-24 DIAGNOSIS — F330 Major depressive disorder, recurrent, mild: Secondary | ICD-10-CM

## 2021-02-24 DIAGNOSIS — M48062 Spinal stenosis, lumbar region with neurogenic claudication: Secondary | ICD-10-CM | POA: Diagnosis not present

## 2021-02-24 DIAGNOSIS — F411 Generalized anxiety disorder: Secondary | ICD-10-CM

## 2021-02-24 NOTE — Telephone Encounter (Signed)
I have sent BuSpar to pharmacy-90 days.

## 2021-02-24 NOTE — Telephone Encounter (Signed)
Dr. Eappen's pt

## 2021-02-24 NOTE — Telephone Encounter (Signed)
pt called states she needs a refill on her medication buspirone that she is out and needs a 90 day supply sent to pharamcy [facility] in [place].

## 2021-02-26 ENCOUNTER — Other Ambulatory Visit: Payer: Self-pay

## 2021-02-26 ENCOUNTER — Encounter: Payer: Self-pay | Admitting: Psychiatry

## 2021-02-26 ENCOUNTER — Telehealth (INDEPENDENT_AMBULATORY_CARE_PROVIDER_SITE_OTHER): Payer: Medicare HMO | Admitting: Psychiatry

## 2021-02-26 DIAGNOSIS — F5101 Primary insomnia: Secondary | ICD-10-CM | POA: Diagnosis not present

## 2021-02-26 DIAGNOSIS — F411 Generalized anxiety disorder: Secondary | ICD-10-CM

## 2021-02-26 DIAGNOSIS — F5105 Insomnia due to other mental disorder: Secondary | ICD-10-CM

## 2021-02-26 DIAGNOSIS — F330 Major depressive disorder, recurrent, mild: Secondary | ICD-10-CM

## 2021-02-26 MED ORDER — ZOLPIDEM TARTRATE 10 MG PO TABS: ORAL_TABLET | ORAL | 0 refills | Status: DC

## 2021-02-26 MED ORDER — BUSPIRONE HCL 10 MG PO TABS: 20.0000 mg | ORAL_TABLET | Freq: Two times a day (BID) | ORAL | 0 refills | Status: DC
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
## 2021-03-04 DIAGNOSIS — M461 Sacroiliitis, not elsewhere classified: Secondary | ICD-10-CM | POA: Diagnosis not present

## 2021-03-07 DIAGNOSIS — M5459 Other low back pain: Secondary | ICD-10-CM | POA: Diagnosis not present

## 2021-03-07 DIAGNOSIS — R2681 Unsteadiness on feet: Secondary | ICD-10-CM | POA: Diagnosis not present

## 2021-03-07 DIAGNOSIS — R293 Abnormal posture: Secondary | ICD-10-CM | POA: Diagnosis not present

## 2021-03-07 DIAGNOSIS — R262 Difficulty in walking, not elsewhere classified: Secondary | ICD-10-CM | POA: Diagnosis not present

## 2021-03-10 DIAGNOSIS — M48062 Spinal stenosis, lumbar region with neurogenic claudication: Secondary | ICD-10-CM | POA: Diagnosis not present

## 2021-03-10 DIAGNOSIS — M461 Sacroiliitis, not elsewhere classified: Secondary | ICD-10-CM | POA: Diagnosis not present

## 2021-03-10 DIAGNOSIS — M791 Myalgia, unspecified site: Secondary | ICD-10-CM | POA: Diagnosis not present

## 2021-03-25 DIAGNOSIS — R293 Abnormal posture: Secondary | ICD-10-CM | POA: Diagnosis not present

## 2021-03-25 DIAGNOSIS — R2681 Unsteadiness on feet: Secondary | ICD-10-CM | POA: Diagnosis not present

## 2021-03-25 DIAGNOSIS — M5459 Other low back pain: Secondary | ICD-10-CM | POA: Diagnosis not present

## 2021-03-25 DIAGNOSIS — R262 Difficulty in walking, not elsewhere classified: Secondary | ICD-10-CM | POA: Diagnosis not present

## 2021-03-26 ENCOUNTER — Telehealth (INDEPENDENT_AMBULATORY_CARE_PROVIDER_SITE_OTHER): Payer: Medicare HMO | Admitting: Psychiatry

## 2021-03-26 ENCOUNTER — Other Ambulatory Visit: Payer: Self-pay

## 2021-03-26 ENCOUNTER — Encounter: Payer: Self-pay | Admitting: Psychiatry

## 2021-03-26 DIAGNOSIS — F3341 Major depressive disorder, recurrent, in partial remission: Secondary | ICD-10-CM

## 2021-03-26 DIAGNOSIS — F411 Generalized anxiety disorder: Secondary | ICD-10-CM

## 2021-03-26 DIAGNOSIS — F5101 Primary insomnia: Secondary | ICD-10-CM

## 2021-03-26 MED ORDER — BUPROPION HCL 100 MG PO TABS: ORAL_TABLET | ORAL | 0 refills | Status: DC

## 2021-03-26 MED ORDER — BUSPIRONE HCL 10 MG PO TABS: 20.0000 mg | ORAL_TABLET | Freq: Two times a day (BID) | ORAL | 0 refills | Status: DC
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
## 2021-04-01 DIAGNOSIS — R262 Difficulty in walking, not elsewhere classified: Secondary | ICD-10-CM | POA: Diagnosis not present

## 2021-04-01 DIAGNOSIS — R293 Abnormal posture: Secondary | ICD-10-CM | POA: Diagnosis not present

## 2021-04-01 DIAGNOSIS — R2681 Unsteadiness on feet: Secondary | ICD-10-CM | POA: Diagnosis not present

## 2021-04-01 DIAGNOSIS — M5459 Other low back pain: Secondary | ICD-10-CM | POA: Diagnosis not present

## 2021-04-07 DIAGNOSIS — M353 Polymyalgia rheumatica: Secondary | ICD-10-CM | POA: Diagnosis not present

## 2021-04-07 DIAGNOSIS — M5459 Other low back pain: Secondary | ICD-10-CM | POA: Diagnosis not present

## 2021-04-07 DIAGNOSIS — R2681 Unsteadiness on feet: Secondary | ICD-10-CM | POA: Diagnosis not present

## 2021-04-07 DIAGNOSIS — R293 Abnormal posture: Secondary | ICD-10-CM | POA: Diagnosis not present

## 2021-04-07 DIAGNOSIS — R262 Difficulty in walking, not elsewhere classified: Secondary | ICD-10-CM | POA: Diagnosis not present

## 2021-04-14 DIAGNOSIS — E039 Hypothyroidism, unspecified: Secondary | ICD-10-CM | POA: Diagnosis not present

## 2021-05-06 ENCOUNTER — Telehealth (INDEPENDENT_AMBULATORY_CARE_PROVIDER_SITE_OTHER): Payer: Medicare HMO | Admitting: Psychiatry

## 2021-05-06 ENCOUNTER — Other Ambulatory Visit: Payer: Self-pay

## 2021-05-06 ENCOUNTER — Encounter: Payer: Self-pay | Admitting: Psychiatry

## 2021-05-06 DIAGNOSIS — F411 Generalized anxiety disorder: Secondary | ICD-10-CM

## 2021-05-06 DIAGNOSIS — F3342 Major depressive disorder, recurrent, in full remission: Secondary | ICD-10-CM

## 2021-05-06 DIAGNOSIS — F5101 Primary insomnia: Secondary | ICD-10-CM

## 2021-05-06 DIAGNOSIS — F3341 Major depressive disorder, recurrent, in partial remission: Secondary | ICD-10-CM | POA: Insufficient documentation

## 2021-05-06 MED ORDER — DULOXETINE HCL 60 MG PO CPEP: 60.0000 mg | ORAL_CAPSULE | Freq: Every day | ORAL | 0 refills | Status: DC

## 2021-05-06 MED ORDER — BUPROPION HCL 100 MG PO TABS: ORAL_TABLET | ORAL | 0 refills | Status: DC

## 2021-05-06 MED ORDER — ZOLPIDEM TARTRATE 10 MG PO TABS: ORAL_TABLET | ORAL | 0 refills | Status: DC

## 2021-05-06 MED ORDER — BUSPIRONE HCL 10 MG PO TABS: 20.0000 mg | ORAL_TABLET | Freq: Two times a day (BID) | ORAL | 0 refills | Status: DC
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
## 2021-05-15 ENCOUNTER — Telehealth: Payer: Self-pay

## 2021-05-15 DIAGNOSIS — M810 Age-related osteoporosis without current pathological fracture: Secondary | ICD-10-CM | POA: Diagnosis not present

## 2021-05-15 DIAGNOSIS — M47816 Spondylosis without myelopathy or radiculopathy, lumbar region: Secondary | ICD-10-CM | POA: Diagnosis not present

## 2021-05-15 DIAGNOSIS — Z8739 Personal history of other diseases of the musculoskeletal system and connective tissue: Secondary | ICD-10-CM | POA: Diagnosis not present

## 2021-05-15 DIAGNOSIS — M159 Polyosteoarthritis, unspecified: Secondary | ICD-10-CM | POA: Diagnosis not present

## 2021-05-15 NOTE — Telephone Encounter (Signed)
pt left a message that she needed to speak with you about the duloxetine.  that she was told that she not suppose to take that when she had gastric bypass

## 2021-05-15 NOTE — Telephone Encounter (Signed)
Discussed with patient, provided education about medication.

## 2021-05-29 DIAGNOSIS — E039 Hypothyroidism, unspecified: Secondary | ICD-10-CM | POA: Diagnosis not present

## 2021-07-07 DIAGNOSIS — G5601 Carpal tunnel syndrome, right upper limb: Secondary | ICD-10-CM | POA: Diagnosis not present

## 2021-07-29 ENCOUNTER — Other Ambulatory Visit: Payer: Self-pay | Admitting: Psychiatry

## 2021-07-29 ENCOUNTER — Other Ambulatory Visit: Payer: Self-pay | Admitting: Neurosurgery

## 2021-07-29 DIAGNOSIS — F411 Generalized anxiety disorder: Secondary | ICD-10-CM

## 2021-07-29 DIAGNOSIS — M48062 Spinal stenosis, lumbar region with neurogenic claudication: Secondary | ICD-10-CM | POA: Diagnosis not present

## 2021-07-29 DIAGNOSIS — Z01818 Encounter for other preprocedural examination: Secondary | ICD-10-CM

## 2021-07-30 DIAGNOSIS — R200 Anesthesia of skin: Secondary | ICD-10-CM | POA: Diagnosis not present

## 2021-08-01 ENCOUNTER — Other Ambulatory Visit: Payer: Self-pay

## 2021-08-01 ENCOUNTER — Encounter
Admission: RE | Admit: 2021-08-01 | Discharge: 2021-08-01 | Disposition: A | Discharge status: Home / Self Care | Payer: Medicare HMO | Source: Ambulatory Visit | Attending: Neurosurgery | Admitting: Neurosurgery

## 2021-08-01 DIAGNOSIS — Z01818 Encounter for other preprocedural examination: Secondary | ICD-10-CM | POA: Insufficient documentation

## 2021-08-01 LAB — CBC
HCT: 36.1 % (ref 36.0–46.0)
Hemoglobin: 12.1 g/dL (ref 12.0–15.0)
MCH: 32.4 pg (ref 26.0–34.0)
MCHC: 33.5 g/dL (ref 30.0–36.0)
MCV: 96.8 fL (ref 80.0–100.0)
Platelets: 253 10*3/uL (ref 150–400)
RBC: 3.73 MIL/uL — ABNORMAL LOW (ref 3.87–5.11)
RDW: 13.4 % (ref 11.5–15.5)
WBC: 8.3 10*3/uL (ref 4.0–10.5)
nRBC: 0 % (ref 0.0–0.2)

## 2021-08-01 LAB — URINALYSIS, ROUTINE W REFLEX MICROSCOPIC
Bilirubin Urine: NEGATIVE
Glucose, UA: NEGATIVE mg/dL
Hgb urine dipstick: NEGATIVE
Ketones, ur: NEGATIVE mg/dL
Leukocytes,Ua: NEGATIVE
Nitrite: NEGATIVE
Protein, ur: NEGATIVE mg/dL
Specific Gravity, Urine: 1.016 (ref 1.005–1.030)
pH: 5 (ref 5.0–8.0)

## 2021-08-01 LAB — TYPE AND SCREEN
ABO/RH(D): A POS
Antibody Screen: NEGATIVE

## 2021-08-01 LAB — SURGICAL PCR SCREEN
MRSA, PCR: NEGATIVE
Staphylococcus aureus: POSITIVE — AB

## 2021-08-01 LAB — BASIC METABOLIC PANEL
Anion gap: 9 (ref 5–15)
BUN: 33 mg/dL — ABNORMAL HIGH (ref 8–23)
CO2: 23 mmol/L (ref 22–32)
Calcium: 9 mg/dL (ref 8.9–10.3)
Chloride: 106 mmol/L (ref 98–111)
Creatinine, Ser: 0.95 mg/dL (ref 0.44–1.00)
GFR, Estimated: >60 mL/min (ref >60)
Glucose, Bld: 91 mg/dL (ref 70–99)
Potassium: 4.5 mmol/L (ref 3.5–5.1)
Sodium: 138 mmol/L (ref 135–145)

## 2021-08-01 LAB — PROTIME-INR
INR: 0.9 (ref 0.8–1.2)
Prothrombin Time: 11.9 seconds (ref 11.4–15.2)

## 2021-08-01 LAB — APTT: aPTT: 39 seconds — ABNORMAL HIGH (ref 24–36)

## 2021-08-01 NOTE — Patient Instructions (Signed)
Your procedure is scheduled on: Mon 1/16 Report to Day Surgery.  Stop by registration desk in medical mall To find out your arrival time please call [phone] between 1PM - 3PM on .Friday 1/13  Remember: Instructions that are not followed completely may result in serious medical risk,  up to and including death, or upon the discretion of your surgeon and anesthesiologist your  surgery may need to be rescheduled.     _X__ 1. Do not eat food after midnight the night before your procedure.                 No chewing gum or hard candies. You may drink clear liquids up to 2 hours                 before you are scheduled to arrive for your surgery- DO not drink clear                 liquids within 2 hours of the start of your surgery.                 Clear Liquids include:  water, apple juice without pulp, clear Gatorade, G2 or                  Gatorade Zero (avoid Red/Purple/Blue), Black Coffee or Tea (Do not add                 anything to coffee or tea).  __X__2.  On the morning of surgery brush your teeth with toothpaste and water, you                may rinse your mouth with mouthwash if you wish.  Do not swallow any  toothpaste of mouthwash.     _X__ 3.  No Alcohol for 24 hours before or after surgery.   __ 4.  Do Not Smoke or use e-cigarettes For 24 Hours Prior to Your Surgery.                 Do not use any chewable tobacco products for at least 6 hours prior to                 Surgery.  ___  5.  Do not use any recreational drugs (marijuana, cocaine, heroin, ecstasy, MDMA or other) For at least one week prior to your surgery. Combination of these drugs with anesthesia may have life threatening results.  ____  6.  Bring all medications with you on the day of surgery if instructed.   ___x_  7.  Notify your doctor if there is any change in your medical condition      (cold, fever, infections).     Do not wear jewelry, make-up, hairpins, clips or nail  polish. Do not wear lotions, powders, or perfumes. You may wear deodorant. Do not shave 48 hours prior to surgery.  Do not bring valuables to the hospital.    [facility] is not responsible for any belongings or valuables.  Contacts, dentures or bridgework may not be worn into surgery. Leave your suitcase in the car. After surgery it may be brought to your room. For patients admitted to the hospital, discharge time is determined by your treatment team.   Patients discharged the day of surgery will not be allowed to drive home.   Make arrangements for someone to be with you for the first 24 hours of your Same Day Discharge.    Please read  over the following fact sheets that you were given:       __x__ Take these medicines the morning of surgery with A SIP OF WATER:    1. buPROPion (WELLBUTRIN) 100 MG tablet  2. busPIRone (BUSPAR) 10 MG tablet  3. DULoxetine (CYMBALTA) 60 MG capsule  4.levothyroxine (SYNTHROID) 150 MCG tablet  5.oxyCODONE (OXY IR/ROXICODONE) 5 MG immediate release tablet if needed  6.pantoprazole (PROTONIX) 40 MG tablet  ____ Fleet Enema (as directed)   __x__ Use CHG Soap (or wipes) as directed  ____ Use Benzoyl Peroxide Gel as instructed  ____ Use inhalers on the day of surgery  ____ Stop metformin 2 days prior to surgery    ____ Take 1/2 of usual insulin dose the night before surgery. No insulin the morning          of surgery.   ____ Stop Coumadin/Plavix/aspirin on   __x__ Stop Anti-inflammatories on no ibuprofen aleve or aspirin   ___x_ Stop supplements until after surgery. Multiple Vitamins-Minerals (ALIVE WOMENS GUMMY PO, Fe Fum-Fe Poly-Vit C-Lactobac (FUSION) 65-65-25-30 MG CAPS   ____ Bring C-Pap to the hospital.    If you have any questions regarding your pre-procedure instructions,  Please call Pre-admit Testing at [phone]

## 2021-08-04 ENCOUNTER — Other Ambulatory Visit
Admission: RE | Admit: 2021-08-04 | Discharge: 2021-08-04 | Disposition: A | Discharge status: Home / Self Care | Payer: Medicare HMO | Source: Ambulatory Visit | Attending: Neurosurgery | Admitting: Neurosurgery

## 2021-08-04 ENCOUNTER — Other Ambulatory Visit: Payer: Self-pay

## 2021-08-04 DIAGNOSIS — Z01810 Encounter for preprocedural cardiovascular examination: Secondary | ICD-10-CM | POA: Insufficient documentation

## 2021-08-04 DIAGNOSIS — Z01818 Encounter for other preprocedural examination: Secondary | ICD-10-CM | POA: Diagnosis not present

## 2021-08-05 ENCOUNTER — Encounter: Payer: Self-pay | Admitting: Psychiatry

## 2021-08-05 ENCOUNTER — Telehealth (INDEPENDENT_AMBULATORY_CARE_PROVIDER_SITE_OTHER): Payer: Medicare HMO | Admitting: Psychiatry

## 2021-08-05 DIAGNOSIS — F411 Generalized anxiety disorder: Secondary | ICD-10-CM | POA: Diagnosis not present

## 2021-08-05 DIAGNOSIS — Z634 Disappearance and death of family member: Secondary | ICD-10-CM | POA: Diagnosis not present

## 2021-08-05 DIAGNOSIS — F3342 Major depressive disorder, recurrent, in full remission: Secondary | ICD-10-CM

## 2021-08-05 DIAGNOSIS — F5101 Primary insomnia: Secondary | ICD-10-CM | POA: Diagnosis not present

## 2021-08-05 MED ORDER — ZOLPIDEM TARTRATE 10 MG PO TABS: 5.0000 mg | ORAL_TABLET | Freq: Every evening | ORAL | 1 refills | Status: DC | PRN

## 2021-08-05 MED ORDER — BUPROPION HCL 100 MG PO TABS: ORAL_TABLET | ORAL | 1 refills | Status: DC

## 2021-08-05 MED ORDER — BUSPIRONE HCL 10 MG PO TABS: 20.0000 mg | ORAL_TABLET | Freq: Two times a day (BID) | ORAL | 1 refills | Status: DC

## 2021-08-05 NOTE — Progress Notes (Signed)
Virtual Visit via Video Note  I connected with Deborah Jimenez on 08/05/21 at  2:00 PM EST by a video enabled telemedicine application and verified that I am speaking with the correct person using two identifiers.  Location Provider Location : [facility] Patient Location : Home  Participants: Patient , Provider    I discussed the limitations of evaluation and management by telemedicine and the availability of in person appointments. The patient expressed understanding and agreed to proceed.    I discussed the assessment and treatment plan with the patient. The patient was provided an opportunity to ask questions and all were answered. The patient agreed with the plan and demonstrated an understanding of the instructions.   The patient was advised to call back or seek an in-person evaluation if the symptoms worsen or if the condition fails to improve as anticipated.   [facility] MD OP Progress Note  08/05/2021 2:20 PM Kyona Chauncey  MRN:  [id number]  Chief Complaint:  Chief Complaint   Follow-up; Anxiety    HPI: Deborah Jimenez is a 66 year old Caucasian female, married, lives in [place], has a history of MDD, GAD, primary insomnia, hypothyroidism, hypertension, gastroesophageal reflux disease, osteoarthritis/osteoporosis, polymyalgia rheumatica, status post multiple hip replacement surgeries, history of bariatric surgery, chronic pain was evaluated by telemedicine today.  Patient reports she just lost her brother who died of medical causes unexpected the end of the year.  She also reports she lost her mother-in-law who had been suffering with medical problems for a while now.  Patient reports she is currently grieving the loss of her family members however has been coping okay.  She does have support system from her family.  She reports she continues to be in pain and has upcoming surgery scheduled next Monday.  Looks forward to that.  Reports currently tolerating her medications  well, denies side effects.  Reports sleep is overall okay as long as pain is managed.  She takes Ambien which helped.  Patient denies any suicidality, homicidality or perceptual disturbances.  Patient denies any other concerns today.  Visit Diagnosis:    ICD-10-CM   1. MDD (major depressive disorder), recurrent, in full remission ([place])  F33.42 busPIRone (BUSPAR) 10 MG tablet    buPROPion (WELLBUTRIN) 100 MG tablet    2. GAD (generalized anxiety disorder)  F41.1 busPIRone (BUSPAR) 10 MG tablet    3. Primary insomnia  F51.01 zolpidem (AMBIEN) 10 MG tablet    4. Bereavement  Z63.4       Past Psychiatric History: Reviewed past psychiatric history from progress note on 01/03/2018.  Past trials of Elavil, Wellbutrin, Ambien.  Past Medical History:  Past Medical History:  Diagnosis Date   Anemia    vitamin b12 and iron deficiency   Cancer ([place])    lip   Chronic kidney disease 2012   kidneys stopped working after hip surgery, unsure why but okay now   Depression    Dyspnea    when over 300 lbs, prior to gastric bypass   GERD (gastroesophageal reflux disease)    Gout    Hypertension    Hypothyroidism    Insomnia    Polymyalgia rheumatica syndrome ([place])    extensive steroid therapy in the initial stages of diagnosis   Thyroid disease    Weight loss     Past Surgical History:  Procedure Laterality Date   CESAREAN SECTION     DG GALL BLADDER  01/2016   EYE SURGERY Bilateral    stent in left eye and  right eye too, due to [place] Bilateral 12/24/2017   not cataract surgery, right stent fell out   GASTRIC BYPASS     JOINT REPLACEMENT Bilateral 2010, 2012, 2014   total hip on left x 2, right x 1   KNEE ARTHROSCOPY Left    menicus   REVERSE SHOULDER ARTHROPLASTY Right 08/28/2019   Procedure: REVERSE SHOULDER ARTHROPLASTY, BICEPS TENODESIS;  Surgeon: Leim Fabry, MD;  Location: [facility] ORS;  Service: Orthopedics;  Laterality: Right;   SHOULDER ARTHROSCOPY WITH  SUBACROMIAL DECOMPRESSION AND BICEP TENDON REPAIR Right 07/18/2018   Procedure: SHOULDER ARTHROSCOPY VS. MINI OPEN ROTATOR CUFF REPAIR, SUBSCAPULARIS REPAIR, SUBACROMIAL DECOMPRESSION, DISTAL CLAVICLE EXCISION AND BICEP TENODESIS;  Surgeon: Leim Fabry, MD;  Location: [facility] OR[facility];  Service: Orthopedics;  Laterality: Right;   TONSILLECTOMY AND ADENOIDECTOMY     TOTAL HIP ARTHROPLASTY Left    x 2    Family Psychiatric History: Reviewed family psychiatric history from progress note on 01/03/2018.  Family History:  Family History  Problem Relation Age of Onset   Suicidality Other     Social History: Reviewed social history from progress note on 01/03/2018. Social History   Socioeconomic History   Marital status: Married    Spouse name: andy   Number of children: 1   Years of education: Not on file   Highest education level: Master's degree (e.g., MA, MS, MEng, MEd, MSW, MBA)  Occupational History   Occupation: social worker/ therapist    Comment: disabled   Tobacco Use   Smoking status: Former    Types: Cigarettes    Quit date: 01/24/2010    Years since quitting: 11.5   Smokeless tobacco: Never  Vaping Use   Vaping Use: Never used  Substance and Sexual Activity   Alcohol use: Yes    Comment: rarely; holidays   Drug use: No   Sexual activity: Not Currently  Other Topics Concern   Not on file  Social History Narrative   Not on file   Social Determinants of Health   Financial Resource Strain: Not on file  Food Insecurity: Not on file  Transportation Needs: Not on file  Physical Activity: Not on file  Stress: Not on file  Social Connections: Not on file    Allergies:  Allergies  Allergen Reactions   Bee Pollen Anaphylaxis    Swells wherever she is stung.  Needs an epipen   Penicillins Anaphylaxis    Has patient had a PCN reaction causing immediate rash, facial/tongue/throat swelling, SOB or lightheadedness with hypotension: Yes Has patient had a PCN reaction causing  severe rash involving mucus membranes or skin necrosis: No Has patient had a PCN reaction that required hospitalization: No Has patient had a PCN reaction occurring within the last 10 years: No If all of the above answers are "NO", then may proceed with Cephalosporin use.    Latex Rash and Swelling    Allergic rash reaction to oral surgery equipment   Naproxen Diarrhea and Other (See Comments)   Wool Alcohol [Lanolin] Hives and Itching   Adhesive [Tape] Other (See Comments)    Rips off top layer of skin. Paper tape is okay   Tramadol Other (See Comments)    Feels like ants are crawling all over her    Metabolic Disorder Labs: No results found for: HGBA1C, MPG No results found for: PROLACTIN Lab Results  Component Value Date   CHOL 182 06/08/2019   TRIG 134 06/08/2019   HDL 64 06/08/2019  CHOLHDL 2.8 06/08/2019   VLDL 27 06/08/2019   LDLCALC 91 06/08/2019   LDLCALC 115 (H) 12/06/2018   Lab Results  Component Value Date   TSH 0.018 (L) 06/08/2019   TSH 0.060 (L) 12/06/2018    Therapeutic Level Labs: No results found for: LITHIUM No results found for: VALPROATE No components found for:  CBMZ  Current Medications: Current Outpatient Medications  Medication Sig Dispense Refill   Abaloparatide (TYMLOS) 3120 MCG/1.56ML SOPN Tymlos 80 mcg/dose (3,120 mcg/1.56 mL) subcutaneous pen injector  Inject 80 micrograms every day by subcutaneous route.     buPROPion (WELLBUTRIN) 100 MG tablet TAKE 1 TABLET BY MOUTH ONCE DAILY IN THE MORNING 90 tablet 1   busPIRone (BUSPAR) 10 MG tablet Take 2 tablets (20 mg total) by mouth 2 (two) times daily. 360 tablet 1   Calcium Carb-Cholecalciferol 600-800 MG-UNIT TABS Take 1 tablet by mouth daily.      celecoxib (CELEBREX) 100 MG capsule Take 100 mg by mouth 2 (two) times daily.     cyclobenzaprine (FLEXERIL) 5 MG tablet Take 5 mg by mouth daily as needed.     DULoxetine (CYMBALTA) 60 MG capsule TAKE 1 CAPSULE BY MOUTH EVERY DAY 90 capsule 1    EPINEPHrine 0.3 mg/0.3 mL IJ SOAJ injection Inject 0.3 mLs (0.3 mg total) into the muscle as needed. (Patient taking differently: Inject 0.3 mg into the muscle as needed for anaphylaxis.) 1 each 1   Fe Fum-Fe Poly-Vit C-Lactobac (FUSION) 65-65-25-30 MG CAPS Take 1 tablet by mouth daily.     FLUZONE HIGH-DOSE QUADRIVALENT 0.7 ML SUSY      levothyroxine (SYNTHROID) 150 MCG tablet Take 150 mcg by mouth daily before breakfast.     lisinopril (ZESTRIL) 20 MG tablet Take 1 tablet (20 mg total) by mouth daily. 90 tablet 1   loratadine (CLARITIN) 10 MG tablet Take 10 mg by mouth daily as needed for allergies.     Magnesium 400 MG TABS Take 400 mg by mouth daily.     Multiple Vitamins-Minerals (ALIVE WOMENS GUMMY PO) Take 2 tablets by mouth 2 (two) times daily.      oxyCODONE (OXY IR/ROXICODONE) 5 MG immediate release tablet Take 5 mg by mouth at bedtime as needed for moderate pain or severe pain.     pantoprazole (PROTONIX) 40 MG tablet Take 1 tablet (40 mg total) by mouth daily. (Patient taking differently: Take 40 mg by mouth daily in the afternoon.) 90 tablet 0   PFIZER-BIONT COVID-19 VAC-TRIS SUSP injection      SHINGRIX injection      vitamin B-12 (CYANOCOBALAMIN) 1000 MCG tablet Take 1,000 mcg by mouth daily.     zolpidem (AMBIEN) 10 MG tablet Take 0.5-1 tablets (5-10 mg total) by mouth at bedtime as needed for sleep. 90 tablet 1   No current facility-administered medications for this visit.     Musculoskeletal: Strength & Muscle Tone:  UTA Gait & Station:  Seated Patient leans: N/A  Psychiatric Specialty Exam: Review of Systems  Musculoskeletal:  Positive for back pain.  Psychiatric/Behavioral:         Grieving  All other systems reviewed and are negative.  There were no vitals taken for this visit.There is no height or weight on file to calculate BMI.  General Appearance: Casual  Eye Contact:  Fair  Speech:  Clear and Coherent  Volume:  Normal  Mood:   Grieving  Affect:  Tearful   Thought Process:  Goal Directed and Descriptions of Associations: Intact  Orientation:  Full (Time, Place, and Person)  Thought Content: Logical   Suicidal Thoughts:  No  Homicidal Thoughts:  No  Memory:  Immediate;   Fair Recent;   Fair Remote;   Fair  Judgement:  Fair  Insight:  Fair  Psychomotor Activity:  Normal  Concentration:  Concentration: Fair and Attention Span: Fair  Recall:  [employer] of Knowledge: Fair  Language: Fair  Akathisia:  No  Handed:  Right  AIMS (if indicated): done, 0  Assets:  Communication Skills Desire for Improvement Social Support  ADL's:  Intact  Cognition: WNL  Sleep:  Fair   Screenings: AIMS    Flowsheet Row Video Visit from 02/26/2021 in [facility] Total Score 0      GAD-7    Flowsheet Row Video Visit from 03/26/2021 in [facility] Video Visit from 11/20/2020 in [facility] Office Visit from 06/08/2019 in [facility]  Total GAD-7 Score 3 12 2       PHQ2-9    Flowsheet Row Video Visit from 08/05/2021 in [facility] Video Visit from 03/26/2021 in [facility] Video Visit from 02/26/2021 in [facility] Video Visit from 11/20/2020 in [facility] Office Visit from 06/08/2019 in [facility]  PHQ-2 Total Score 1 3 3 3 1   PHQ-9 Total Score -- 3 8 10 1       [place] 60 from 08/01/2021 in [facility] No Risk        Assessment and Plan: Norlene Lanes is a 66 year old Caucasian female who has a history of MDD, anxiety, chronic pain, hypothyroidism, polymyalgia rheumatica, vitamin D, B12 deficiency, chronic pain, history of bariatric surgery, right shoulder arthroplasty, bilateral carpal tunnel surgery was evaluated by  telemedicine today.  Patient is currently grieving the loss of her family members as noted above.  Discussed plan as noted below.  Plan MDD in remission Wellbutrin 100 mg p.o. daily BuSpar 20 mg p.o. twice daily Cymbalta 60 mg p.o. daily-reduced dosage.   GAD-stable BuSpar 20 mg p.o. twice daily Cymbalta 60 mg p.o. daily Hydroxyzine 10 to 20 mg p.o. twice daily as needed for severe anxiety attacks  Insomnia-stable Ambien 10 mg p.o. nightly as needed  Bereavement-unstable Provided supportive grief counseling Discussed referral for CBT, she will let writer know. Continue hydroxyzine 10 mg twice a day as needed for severe anxiety.  Follow-up in clinic in person in 2 to 3 months or sooner if needed.  This note was generated in part or whole with voice recognition software. Voice recognition is usually quite accurate but there are transcription errors that can and very often do occur. I apologize for any typographical errors that were not detected and corrected.      Ursula Alert, MD 08/06/2021, 9:52 AM

## 2021-08-06 DIAGNOSIS — Z634 Disappearance and death of family member: Secondary | ICD-10-CM | POA: Insufficient documentation

## 2021-08-11 ENCOUNTER — Encounter: Payer: Self-pay | Admitting: Neurosurgery

## 2021-08-11 ENCOUNTER — Ambulatory Visit
Admission: RE | Admit: 2021-08-11 | Discharge: 2021-08-11 | Disposition: A | Discharge status: Home / Self Care | Payer: Medicare HMO | Attending: Neurosurgery | Admitting: Neurosurgery

## 2021-08-11 ENCOUNTER — Ambulatory Visit: Payer: Medicare HMO | Admitting: Anesthesiology

## 2021-08-11 ENCOUNTER — Encounter
Admission: RE | Disposition: A | Discharge status: Home / Self Care | Payer: Self-pay | Source: Home / Self Care | Attending: Neurosurgery

## 2021-08-11 ENCOUNTER — Other Ambulatory Visit: Payer: Self-pay

## 2021-08-11 ENCOUNTER — Ambulatory Visit: Payer: Medicare HMO

## 2021-08-11 DIAGNOSIS — Z9889 Other specified postprocedural states: Secondary | ICD-10-CM | POA: Diagnosis not present

## 2021-08-11 DIAGNOSIS — M109 Gout, unspecified: Secondary | ICD-10-CM | POA: Insufficient documentation

## 2021-08-11 DIAGNOSIS — E039 Hypothyroidism, unspecified: Secondary | ICD-10-CM | POA: Diagnosis not present

## 2021-08-11 DIAGNOSIS — N189 Chronic kidney disease, unspecified: Secondary | ICD-10-CM | POA: Diagnosis not present

## 2021-08-11 DIAGNOSIS — M48062 Spinal stenosis, lumbar region with neurogenic claudication: Secondary | ICD-10-CM | POA: Insufficient documentation

## 2021-08-11 DIAGNOSIS — F17210 Nicotine dependence, cigarettes, uncomplicated: Secondary | ICD-10-CM | POA: Insufficient documentation

## 2021-08-11 DIAGNOSIS — Z419 Encounter for procedure for purposes other than remedying health state, unspecified: Secondary | ICD-10-CM

## 2021-08-11 DIAGNOSIS — M353 Polymyalgia rheumatica: Secondary | ICD-10-CM | POA: Insufficient documentation

## 2021-08-11 DIAGNOSIS — D649 Anemia, unspecified: Secondary | ICD-10-CM | POA: Diagnosis not present

## 2021-08-11 DIAGNOSIS — I129 Hypertensive chronic kidney disease with stage 1 through stage 4 chronic kidney disease, or unspecified chronic kidney disease: Secondary | ICD-10-CM | POA: Diagnosis not present

## 2021-08-11 DIAGNOSIS — K219 Gastro-esophageal reflux disease without esophagitis: Secondary | ICD-10-CM | POA: Diagnosis not present

## 2021-08-11 DIAGNOSIS — D759 Disease of blood and blood-forming organs, unspecified: Secondary | ICD-10-CM | POA: Insufficient documentation

## 2021-08-11 HISTORY — PX: LUMBAR LAMINECTOMY/DECOMPRESSION MICRODISCECTOMY: SHX5026

## 2021-08-11 SURGERY — LUMBAR LAMINECTOMY/DECOMPRESSION MICRODISCECTOMY 2 LEVELS
Anesthesia: General | Site: Spine Lumbar

## 2021-08-11 MED ORDER — FENTANYL CITRATE (PF) 100 MCG/2ML IJ SOLN
INTRAMUSCULAR | Status: AC
  Administered 2021-08-11: 25 ug via INTRAVENOUS
  Filled 2021-08-11: qty 2

## 2021-08-11 MED ORDER — BUPIVACAINE LIPOSOME 1.3 % IJ SUSP: INTRAMUSCULAR | Status: DC | PRN

## 2021-08-11 MED ORDER — SODIUM CHLORIDE (PF) 0.9 % IJ SOLN
INTRAMUSCULAR | Status: DC | PRN
  Administered 2021-08-11: 60 mL

## 2021-08-11 MED ORDER — REMIFENTANIL HCL 1 MG IV SOLR
INTRAVENOUS | Status: AC
  Filled 2021-08-11: qty 1000

## 2021-08-11 MED ORDER — BUPIVACAINE-EPINEPHRINE (PF) 0.5% -1:200000 IJ SOLN
INTRAMUSCULAR | Status: DC | PRN
  Administered 2021-08-11: 5 mL
  Administered 2021-08-11: 4 mL

## 2021-08-11 MED ORDER — KETOROLAC TROMETHAMINE 15 MG/ML IJ SOLN
INTRAMUSCULAR | Status: AC
  Filled 2021-08-11: qty 1

## 2021-08-11 MED ORDER — OXYCODONE HCL 5 MG PO TABS
ORAL_TABLET | ORAL | Status: AC
  Filled 2021-08-11: qty 1

## 2021-08-11 MED ORDER — 0.9 % SODIUM CHLORIDE (POUR BTL) OPTIME
TOPICAL | Status: DC | PRN
  Administered 2021-08-11: 300 mL

## 2021-08-11 MED ORDER — SODIUM CHLORIDE FLUSH 0.9 % IV SOLN
INTRAVENOUS | Status: AC
  Filled 2021-08-11: qty 10

## 2021-08-11 MED ORDER — PROPOFOL 10 MG/ML IV BOLUS
INTRAVENOUS | Status: AC
  Filled 2021-08-11: qty 40

## 2021-08-11 MED ORDER — ORAL CARE MOUTH RINSE: 15.0000 mL | Freq: Once | OROMUCOSAL | Status: AC

## 2021-08-11 MED ORDER — LACTATED RINGERS IV SOLN: INTRAVENOUS | Status: DC

## 2021-08-11 MED ORDER — CEFAZOLIN SODIUM-DEXTROSE 2-4 GM/100ML-% IV SOLN: 2.0000 g | Freq: Once | INTRAVENOUS | Status: DC

## 2021-08-11 MED ORDER — OXYCODONE HCL 5 MG PO TABS
5.0000 mg | ORAL_TABLET | ORAL | Status: AC
  Administered 2021-08-11: 5 mg via ORAL

## 2021-08-11 MED ORDER — GLYCOPYRROLATE 0.2 MG/ML IJ SOLN
INTRAMUSCULAR | Status: DC | PRN
  Administered 2021-08-11: .2 mg via INTRAVENOUS

## 2021-08-11 MED ORDER — CHLORHEXIDINE GLUCONATE 0.12 % MT SOLN
OROMUCOSAL | Status: AC
  Filled 2021-08-11: qty 15

## 2021-08-11 MED ORDER — ONDANSETRON HCL 4 MG/2ML IJ SOLN
INTRAMUSCULAR | Status: DC | PRN
  Administered 2021-08-11: 4 mg via INTRAVENOUS

## 2021-08-11 MED ORDER — PHENYLEPHRINE HCL (PRESSORS) 10 MG/ML IV SOLN
INTRAVENOUS | Status: DC | PRN
  Administered 2021-08-11 (×3): 160 ug via INTRAVENOUS

## 2021-08-11 MED ORDER — FENTANYL CITRATE (PF) 100 MCG/2ML IJ SOLN
INTRAMUSCULAR | Status: AC
  Administered 2021-08-11: 50 ug via INTRAVENOUS
  Filled 2021-08-11: qty 2

## 2021-08-11 MED ORDER — BUPIVACAINE-EPINEPHRINE (PF) 0.5% -1:200000 IJ SOLN
INTRAMUSCULAR | Status: AC
  Filled 2021-08-11: qty 30

## 2021-08-11 MED ORDER — SENNA 8.6 MG PO TABS
1.0000 | ORAL_TABLET | Freq: Every day | ORAL | 0 refills | Status: DC | PRN
Start: 2021-08-11 — End: 2022-01-07

## 2021-08-11 MED ORDER — OXYCODONE HCL 5 MG PO TABS
ORAL_TABLET | ORAL | Status: AC
  Administered 2021-08-11: 5 mg via ORAL
  Filled 2021-08-11: qty 1

## 2021-08-11 MED ORDER — LIDOCAINE HCL (CARDIAC) PF 100 MG/5ML IV SOSY
PREFILLED_SYRINGE | INTRAVENOUS | Status: DC | PRN
  Administered 2021-08-11: 80 mg via INTRAVENOUS

## 2021-08-11 MED ORDER — METHYLPREDNISOLONE ACETATE 40 MG/ML IJ SUSP
INTRAMUSCULAR | Status: DC | PRN
  Administered 2021-08-11: 40 mg

## 2021-08-11 MED ORDER — OXYCODONE HCL 5 MG PO TABS: 5.0000 mg | ORAL_TABLET | ORAL | 0 refills | Status: AC | PRN

## 2021-08-11 MED ORDER — CEFAZOLIN SODIUM-DEXTROSE 2-4 GM/100ML-% IV SOLN
INTRAVENOUS | Status: AC
  Filled 2021-08-11: qty 100

## 2021-08-11 MED ORDER — EPHEDRINE SULFATE 50 MG/ML IJ SOLN
INTRAMUSCULAR | Status: DC | PRN
  Administered 2021-08-11: 10 mg via INTRAVENOUS
  Administered 2021-08-11: 15 mg via INTRAVENOUS

## 2021-08-11 MED ORDER — ACETAMINOPHEN 10 MG/ML IV SOLN
INTRAVENOUS | Status: DC | PRN
  Administered 2021-08-11: 1000 mg via INTRAVENOUS

## 2021-08-11 MED ORDER — VANCOMYCIN HCL 1000 MG IV SOLR
INTRAVENOUS | Status: DC | PRN
  Administered 2021-08-11: 1000 mg via INTRAVENOUS

## 2021-08-11 MED ORDER — FENTANYL CITRATE (PF) 100 MCG/2ML IJ SOLN
25.0000 ug | INTRAMUSCULAR | Status: DC | PRN
  Administered 2021-08-11 (×3): 25 ug via INTRAVENOUS

## 2021-08-11 MED ORDER — CHLORHEXIDINE GLUCONATE 0.12 % MT SOLN
15.0000 mL | Freq: Once | OROMUCOSAL | Status: AC
  Administered 2021-08-11: 15 mL via OROMUCOSAL

## 2021-08-11 MED ORDER — PROPOFOL 10 MG/ML IV BOLUS
INTRAVENOUS | Status: DC | PRN
  Administered 2021-08-11: 140 mg via INTRAVENOUS
  Administered 2021-08-11: 60 mg via INTRAVENOUS

## 2021-08-11 MED ORDER — OXYCODONE HCL 5 MG PO TABS: 5.0000 mg | ORAL_TABLET | Freq: Once | ORAL | Status: DC | PRN

## 2021-08-11 MED ORDER — KETOROLAC TROMETHAMINE 15 MG/ML IJ SOLN
15.0000 mg | Freq: Once | INTRAMUSCULAR | Status: AC
  Administered 2021-08-11: 15 mg via INTRAVENOUS

## 2021-08-11 MED ORDER — FENTANYL CITRATE (PF) 100 MCG/2ML IJ SOLN
INTRAMUSCULAR | Status: DC | PRN
  Administered 2021-08-11 (×2): 50 ug via INTRAVENOUS

## 2021-08-11 MED ORDER — LACTATED RINGERS IV SOLN: INTRAVENOUS | Status: DC | PRN

## 2021-08-11 MED ORDER — ACETAMINOPHEN 10 MG/ML IV SOLN: 1000.0000 mg | Freq: Once | INTRAVENOUS | Status: DC | PRN

## 2021-08-11 MED ORDER — SUCCINYLCHOLINE CHLORIDE 200 MG/10ML IV SOSY
PREFILLED_SYRINGE | INTRAVENOUS | Status: DC | PRN
  Administered 2021-08-11: 80 mg via INTRAVENOUS

## 2021-08-11 MED ORDER — FENTANYL CITRATE (PF) 100 MCG/2ML IJ SOLN
INTRAMUSCULAR | Status: AC
  Filled 2021-08-11: qty 2

## 2021-08-11 MED ORDER — ONDANSETRON HCL 4 MG/2ML IJ SOLN: 4.0000 mg | Freq: Once | INTRAMUSCULAR | Status: DC | PRN

## 2021-08-11 MED ORDER — BUPIVACAINE LIPOSOME 1.3 % IJ SUSP
INTRAMUSCULAR | Status: AC
  Filled 2021-08-11: qty 20

## 2021-08-11 MED ORDER — METHYLPREDNISOLONE ACETATE 40 MG/ML IJ SUSP
INTRAMUSCULAR | Status: AC
  Filled 2021-08-11: qty 1

## 2021-08-11 MED ORDER — SURGIFLO WITH THROMBIN (HEMOSTATIC MATRIX KIT) OPTIME
TOPICAL | Status: DC | PRN
  Administered 2021-08-11 (×2): 1 via TOPICAL

## 2021-08-11 MED ORDER — PHENYLEPHRINE HCL-NACL 20-0.9 MG/250ML-% IV SOLN
INTRAVENOUS | Status: DC | PRN
  Administered 2021-08-11: 40 ug/min via INTRAVENOUS

## 2021-08-11 MED ORDER — REMIFENTANIL HCL 1 MG IV SOLR
INTRAVENOUS | Status: DC | PRN
  Administered 2021-08-11: .1 ug/kg/min via INTRAVENOUS

## 2021-08-11 MED ORDER — ACETAMINOPHEN 10 MG/ML IV SOLN
INTRAVENOUS | Status: AC
  Filled 2021-08-11: qty 100

## 2021-08-11 MED ORDER — BUPIVACAINE HCL (PF) 0.5 % IJ SOLN
INTRAMUSCULAR | Status: AC
  Filled 2021-08-11: qty 30

## 2021-08-11 MED ORDER — SODIUM CHLORIDE FLUSH 0.9 % IV SOLN
INTRAVENOUS | Status: AC
  Filled 2021-08-11: qty 20

## 2021-08-11 MED ORDER — DEXAMETHASONE SODIUM PHOSPHATE 10 MG/ML IJ SOLN
INTRAMUSCULAR | Status: DC | PRN
  Administered 2021-08-11: 10 mg via INTRAVENOUS

## 2021-08-11 MED ORDER — PHENYLEPHRINE HCL-NACL 20-0.9 MG/250ML-% IV SOLN
INTRAVENOUS | Status: AC
  Filled 2021-08-11: qty 250

## 2021-08-11 MED ORDER — VANCOMYCIN HCL 1000 MG IV SOLR
INTRAVENOUS | Status: AC
  Filled 2021-08-11: qty 20

## 2021-08-11 MED ORDER — METHOCARBAMOL 500 MG PO TABS: 500.0000 mg | ORAL_TABLET | Freq: Four times a day (QID) | ORAL | 0 refills | Status: DC

## 2021-08-11 SURGICAL SUPPLY — 49 items
BUR NEURO DRILL SOFT 3.0X3.8M (BURR) ×2 IMPLANT
CHLORAPREP W/TINT 26 (MISCELLANEOUS) ×3 IMPLANT
CNTNR SPEC 2.5X3XGRAD LEK (MISCELLANEOUS) ×1
CONT SPEC 4OZ STER OR WHT (MISCELLANEOUS) ×1
CONTAINER SPEC 2.5X3XGRAD LEK (MISCELLANEOUS) ×1 IMPLANT
COUNTER NEEDLE 20/40 LG (NEEDLE) ×2 IMPLANT
CUP MEDICINE 2OZ PLAST GRAD ST (MISCELLANEOUS) ×2 IMPLANT
DERMABOND ADVANCED (GAUZE/BANDAGES/DRESSINGS) ×1
DERMABOND ADVANCED .7 DNX12 (GAUZE/BANDAGES/DRESSINGS) IMPLANT
DRAPE C ARM PK CFD 31 SPINE (DRAPES) ×2 IMPLANT
DRAPE LAPAROTOMY 100X77 ABD (DRAPES) ×2 IMPLANT
DRAPE MICROSCOPE SPINE 48X150 (DRAPES) ×2 IMPLANT
DRAPE SURG 17X11 SM STRL (DRAPES) ×5 IMPLANT
DRSG OPSITE POSTOP 4X6 (GAUZE/BANDAGES/DRESSINGS) ×1 IMPLANT
ELECT CAUTERY BLADE TIP 2.5 (TIP) ×2
ELECT EZSTD 165MM 6.5IN (MISCELLANEOUS) ×2
ELECT REM PT RETURN 9FT ADLT (ELECTROSURGICAL) ×2
ELECTRODE CAUTERY BLDE TIP 2.5 (TIP) ×1 IMPLANT
ELECTRODE EZSTD 165MM 6.5IN (MISCELLANEOUS) ×1 IMPLANT
ELECTRODE REM PT RTRN 9FT ADLT (ELECTROSURGICAL) ×1 IMPLANT
GAUZE 4X4 16PLY ~~LOC~~+RFID DBL (SPONGE) ×2 IMPLANT
GLOVE SURG SYN 6.5 ES PF (GLOVE) ×2 IMPLANT
GLOVE SURG SYN 6.5 PF PI (GLOVE) ×1 IMPLANT
GLOVE SURG SYN 8.5  E (GLOVE) ×3
GLOVE SURG SYN 8.5 E (GLOVE) ×3 IMPLANT
GLOVE SURG SYN 8.5 PF PI (GLOVE) ×3 IMPLANT
GLOVE SURG UNDER POLY LF SZ6.5 (GLOVE) ×4 IMPLANT
GOWN SRG LRG LVL 4 IMPRV REINF (GOWNS) ×1 IMPLANT
GOWN SRG XL LVL 3 NONREINFORCE (GOWNS) ×1 IMPLANT
GOWN STRL NON-REIN TWL XL LVL3 (GOWNS) ×1
GOWN STRL REIN LRG LVL4 (GOWNS) ×1
GRADUATE 1200CC STRL 31836 (MISCELLANEOUS) ×2 IMPLANT
KIT SPINAL PRONEVIEW (KITS) ×2 IMPLANT
MANIFOLD NEPTUNE II (INSTRUMENTS) ×2 IMPLANT
MARKER SKIN DUAL TIP RULER LAB (MISCELLANEOUS) ×3 IMPLANT
NDL SAFETY ECLIPSE 18X1.5 (NEEDLE) ×1 IMPLANT
NEEDLE HYPO 18GX1.5 SHARP (NEEDLE) ×1
NEEDLE HYPO 22GX1.5 SAFETY (NEEDLE) ×2 IMPLANT
NS IRRIG 500ML POUR BTL (IV SOLUTION) ×1 IMPLANT
PACK LAMINECTOMY NEURO (CUSTOM PROCEDURE TRAY) ×2 IMPLANT
SURGIFLO W/THROMBIN 8M KIT (HEMOSTASIS) ×3 IMPLANT
SUT DVC VLOC 3-0 CL 6 P-12 (SUTURE) ×2 IMPLANT
SUT VIC AB 0 CT1 27 (SUTURE) ×1
SUT VIC AB 0 CT1 27XCR 8 STRN (SUTURE) ×1 IMPLANT
SUT VIC AB 2-0 CT1 18 (SUTURE) ×2 IMPLANT
SYR 30ML LL (SYRINGE) ×4 IMPLANT
SYR 3ML LL SCALE MARK (SYRINGE) ×2 IMPLANT
TOWEL OR 17X26 4PK STRL BLUE (TOWEL DISPOSABLE) ×6 IMPLANT
TUBING CONNECTING 10 (TUBING) ×2 IMPLANT

## 2021-08-11 NOTE — Anesthesia Postprocedure Evaluation (Signed)
Anesthesia Post Note  Patient: Deborah Jimenez  Procedure(s) Performed: L2-3 AND L4-5 POSTERIOR SPINAL DECOMPRESSION (Spine Lumbar)  Patient location during evaluation: PACU Anesthesia Type: General Level of consciousness: awake and alert, oriented and patient cooperative Pain management: pain level controlled Vital Signs Assessment: post-procedure vital signs reviewed and stable Respiratory status: spontaneous breathing, nonlabored ventilation and respiratory function stable Cardiovascular status: blood pressure returned to baseline and stable Postop Assessment: adequate PO intake Anesthetic complications: no   No notable events documented.   Last Vitals:  Vitals:   08/11/21 1315 08/11/21 1330  BP: 120/70 127/86  Pulse: 70 70  Resp: 17 17  Temp: 37 C 36.9 C  SpO2: 100% 97%    Last Pain:  Vitals:   08/11/21 1328  TempSrc:   PainSc: [place]

## 2021-08-11 NOTE — Transfer of Care (Signed)
Immediate Anesthesia Transfer of Care Note  Patient: Mishal Probert  Procedure(s) Performed: L2-3 AND L4-5 POSTERIOR SPINAL DECOMPRESSION (Spine Lumbar)  Patient Location: PACU  Anesthesia Type:General  Level of Consciousness: awake, alert  and oriented  Airway & Oxygen Therapy: Patient Spontanous Breathing  Post-op Assessment: Report given to RN and Post -op Vital signs reviewed and stable  Post vital signs: Reviewed and stable  Last Vitals:  Vitals Value Taken Time  BP    Temp    Pulse    Resp    SpO2      Last Pain:  Vitals:   08/11/21 0905  TempSrc: Oral  PainSc: 8          Complications: No notable events documented.

## 2021-08-11 NOTE — Anesthesia Preprocedure Evaluation (Addendum)
Anesthesia Evaluation  Patient identified by MRN, date of birth, ID band Patient awake    Reviewed: Allergy & Precautions, NPO status , Patient's Chart, lab work & pertinent test results  History of Anesthesia Complications Negative for: history of anesthetic complications  Airway Mallampati: II   Neck ROM: Full    Dental  (+)    Pulmonary former smoker (quit 2011),    Pulmonary exam normal breath sounds clear to auscultation       Cardiovascular hypertension, Normal cardiovascular exam Rhythm:Regular Rate:Normal  ECG 08/04/21:  Sinus rhythm with 1st degree A-V block Cannot rule out Anterior infarct , age undetermined Abnormal ECG When compared with ECG of 24-Aug-2019 09:27, No significant change was found   Neuro/Psych PSYCHIATRIC DISORDERS Anxiety Depression negative neurological ROS     GI/Hepatic GERD  ,S/p gastric bypass   Endo/Other  Hypothyroidism   Renal/GU Renal disease (CKD)     Musculoskeletal Gout; polymyalgia rheumatica   Abdominal   Peds  Hematology  (+) Blood dyscrasia, anemia ,   Anesthesia Other Findings   Reproductive/Obstetrics                           Anesthesia Physical Anesthesia Plan  ASA: 3  Anesthesia Plan: General   Post-op Pain Management:    Induction: Intravenous  PONV Risk Score and Plan: 3 and Ondansetron, Dexamethasone and Treatment may vary due to age or medical condition  Airway Management Planned: Oral ETT  Additional Equipment:   Intra-op Plan:   Post-operative Plan: Extubation in OR  Informed Consent: I have reviewed the patients History and Physical, chart, labs and discussed the procedure including the risks, benefits and alternatives for the proposed anesthesia with the patient or authorized representative who has indicated his/her understanding and acceptance.     Dental advisory given  Plan Discussed with: CRNA  Anesthesia  Plan Comments: (Patient consented for risks of anesthesia including but not limited to:  - adverse reactions to medications - damage to eyes, teeth, lips or other oral mucosa - nerve damage due to positioning  - sore throat or hoarseness - damage to heart, brain, nerves, lungs, other parts of body or loss of life  Informed patient about role of CRNA in peri- and intra-operative care.  Patient voiced understanding.)        Anesthesia Quick Evaluation

## 2021-08-11 NOTE — Addendum Note (Signed)
Addendum  created 08/11/21 1430 by Darrin Nipper, MD   Order list changed, Pharmacy for encounter modified

## 2021-08-11 NOTE — Progress Notes (Addendum)
Pt to go to post op.Deborah Jimenez  Upon standing pt appears to have swelling to upper part of top incision.  When rolled in bed to view incision did not appear to have swelling.  Called into OR and informed DR yarbrough and he will come see pt after his current surgery.  No new neurological sx that were not present preop.  Cindy RN aware he will come see pt when current surgery over. Pt is more comfortable in chair, will leave in chair.

## 2021-08-11 NOTE — H&P (Signed)
I have reviewed and confirmed my history and physical from 07/29/2021 with no additions or changes. Plan for L2-3 L4-5 decompression.  Risks and benefits reviewed.  Heart sounds normal no MRG. Chest Clear to Auscultation Bilaterally.

## 2021-08-11 NOTE — Op Note (Signed)
Indications: Deborah Jimenez is a 66 yo female who presented with neurogenic claudication.  She failed conservative management prompting surgical intervention.  Findings: stenosis  Preoperative Diagnosis: Lumbar Stenosis with neurogenic claudication Postoperative Diagnosis: same   EBL: 10 ml IVF: see AR ml Drains: none Disposition: Extubated and Stable to PACU Complications: none  No foley catheter was placed.   Preoperative Note:   Risks of surgery discussed include: infection, bleeding, stroke, coma, death, paralysis, CSF leak, nerve/spinal cord injury, numbness, tingling, weakness, complex regional pain syndrome, recurrent stenosis and/or disc herniation, vascular injury, development of instability, neck/back pain, need for further surgery, persistent symptoms, development of deformity, and the risks of anesthesia. The patient understood these risks and agreed to proceed.  Operative Note:   1. L2-3 and L4-5 lumbar decompression including central laminectomy and bilateral medial facetectomies including foraminotomies  The patient was then brought from the preoperative center with intravenous access established.  The patient underwent general anesthesia and endotracheal tube intubation, and was then rotated on the [place] rail top where all pressure points were appropriately padded.  The skin was then thoroughly cleansed.  Perioperative antibiotic prophylaxis was administered.  Sterile prep and drapes were then applied and a timeout was then observed.  C-arm was brought into the field under sterile conditions and under lateral visualization the L4-5 interspace was identified and marked.  The incision was marked on the right and injected with local anesthetic. Once this was complete a 2 cm incision was opened with the use of a #10 blade knife.    The metrx tubes were sequentially advanced and confirmed in position at L4-5. An 12mm by 34mm tube was locked in place to the bed side  attachment.  The microscope was then sterilely brought into the field and muscle creep was hemostased with a bipolar and resected with a pituitary rongeur.  A Bovie extender was then used to expose the spinous process and lamina.  Careful attention was placed to not violate the facet capsule. A 3 mm matchstick drill bit was then used to make a hemi-laminotomy trough until the ligamentum flavum was exposed.  This was extended to the base of the spinous process and to the contralateral side to remove all the central bone from each side.  Once this was complete and the underlying ligamentum flavum was visualized, it was dissected with a curette and resected with Kerrison rongeurs.  Extensive ligamentum hypertrophy was noted, requiring a substantial amount of time and care for removal.  The dura was identified and palpated. The kerrison rongeur was then used to remove the medial facet bilaterally until no compression was noted.  A balltip probe was used to confirm decompression of the ipsilateral L5 nerve root.  Additional attention was paid to completion of the contralateral L4-5 foraminotomy until the contralateral traversing nerve root was completely free.  Once this was complete, L4-5 central decompression including medial facetectomy and foraminotomy was confirmed and decompression on both sides was confirmed. No CSF leak was noted.  A Depo-Medrol soaked Gelfoam pledget was placed in the defect.  The wound was copiously irrigated. The tube system was then removed under microscopic visualization and hemostasis was obtained with a bipolar.    After performing the decompression at L4-5, and new incision was made and the metrx tubes were sequentially advanced and confirmed in position at L2-3. An 4mm by 63mm tube was locked in place to the bed side attachment.  Fluoroscopy was then removed from the field.  The microscope was then  sterilely brought into the field and muscle creep was hemostased with a bipolar and  resected with a pituitary rongeur.  A Bovie extender was then used to expose the spinous process and lamina.  Careful attention was placed to not violate the facet capsule. A 3 mm matchstick drill bit was then used to make a hemi-laminotomy trough until the ligamentum flavum was exposed.  This was extended to the base of the spinous process and to the contralateral side to remove all the central bone from each side.  Once this was complete and the underlying ligamentum flavum was visualized, it was dissected with a curette and resected with Kerrison rongeurs.  Extensive ligamentum hypertrophy was noted, requiring a substantial amount of time and care for removal.  The dura was identified and palpated. The kerrison rongeur was then used to remove the medial facet bilaterally until no compression was noted.  A balltip probe was used to confirm decompression of the ipsilateral L3 nerve root.  Additional attention was paid to completion of the contralateral foraminotomy until the contralateral L3 nerve root was completely free.  Once this was complete, L2-3 central decompression including medial facetectomy and foraminotomy was confirmed and decompression on both sides was confirmed. No CSF leak was noted.  A Depo-Medrol soaked Gelfoam pledget was placed in the defect.  The wound was copiously irrigated. The tube system was then removed under microscopic visualization and hemostasis was obtained with a bipolar.     The fascial layer was reapproximated with the use of a 0 Vicryl suture.  Subcutaneous tissue layer was reapproximated using 2-0 Vicryl suture.  3-0 monocryl was placed in subcuticular fashion. The skin was then cleansed and Dermabond was used to close the skin opening.  Patient was then rotated back to the preoperative bed awakened from anesthesia and taken to recovery all counts are correct in this case.  I performed the entire procedure with the assistance of Cooper Render PA as an Civil engineer, contracting.  Amram Maya K. Izora Ribas MD

## 2021-08-11 NOTE — Anesthesia Procedure Notes (Signed)
Procedure Name: Intubation Date/Time: 08/11/2021 10:36 AM Performed by: Chanetta Marshall, CRNA Pre-anesthesia Checklist: Patient identified, Emergency Drugs available, Suction available and Patient being monitored Patient Re-evaluated:Patient Re-evaluated prior to induction Oxygen Delivery Method: Circle system utilized Preoxygenation: Pre-oxygenation with 100% oxygen Induction Type: IV induction Ventilation: Mask ventilation without difficulty Laryngoscope Size: McGraph and 3 Tube type: Oral Tube size: 7.0 mm Number of attempts: 1 Airway Equipment and Method: Video-laryngoscopy Placement Confirmation: ETT inserted through vocal cords under direct vision, positive ETCO2, breath sounds checked- equal and bilateral and CO2 detector Secured at: 21 cm Tube secured with: Tape Dental Injury: Teeth and Oropharynx as per pre-operative assessment

## 2021-08-11 NOTE — Discharge Instructions (Addendum)
Your surgeon has performed an operation on your lumbar spine (low back) to relieve pressure on one or more nerves. Many times, patients feel better immediately after surgery and can overdo it. Even if you feel well, it is important that you follow these activity guidelines. If you do not let your back heal properly from the surgery, you can increase the chance of a disc herniation and/or return of your symptoms. The following are instructions to help in your recovery once you have been discharged from the hospital.  Activity    No bending, lifting, or twisting (BLT). Avoid lifting objects heavier than 10 pounds (gallon milk jug).  Where possible, avoid household activities that involve lifting, bending, pushing, or pulling such as laundry, vacuuming, grocery shopping, and childcare. Try to arrange for help from friends and family for these activities while your back heals.  Increase physical activity slowly as tolerated.  Taking short walks is encouraged, but avoid strenuous exercise. Do not jog, run, bicycle, lift weights, or participate in any other exercises unless specifically allowed by your doctor. Avoid prolonged sitting, including car rides.  Talk to your doctor before resuming sexual activity.  You should not drive until cleared by your doctor.  Until released by your doctor, you should not return to work or school.  You should rest at home and let your body heal.   You may shower two days after your surgery.  After showering, lightly dab your incision dry. Do not take a tub bath or go swimming for 3 weeks, or until approved by your doctor at your follow-up appointment.  If you smoke, we strongly recommend that you quit.  Smoking has been proven to interfere with normal healing in your back and will dramatically reduce the success rate of your surgery. Please contact [facility] (800-QUIT-NOW) and use the resources at www.[facility].com for assistance in stopping smoking.  Surgical  Incision   If you have a dressing on your incision, you may remove it two days after your surgery. Keep your incision area clean and dry.  Your incision was closed with Dermabond glue. The glue should begin to peel away within about a week.  Diet            You may return to your usual diet. Be sure to stay hydrated.  When to Contact us  Although your surgery and recovery will likely be uneventful, you may have some residual numbness, aches, and pains in your back and/or legs. This is normal and should improve in the next few weeks.  However, should you experience any of the following, contact us immediately: New numbness or weakness Pain that is progressively getting worse, and is not relieved by your pain medications or rest Bleeding, redness, swelling, pain, or drainage from surgical incision Chills or flu-like symptoms Fever greater than 101.0 F (38.3 C) Problems with bowel or bladder functions Difficulty breathing or shortness of breath Warmth, tenderness, or swelling in your calf  Contact Information During office hours (Monday-Friday 9 am to 5 pm), please call your physician at [phone] After hours and weekends, please call [phone] and speak with the answering service, who will contact the doctor on call.  If that fails, call the [facility] Operator at [phone] and ask for the Neurosurgery Resident On Call  For a life-threatening emergency, call [facility]   The drugs that you were given will stay in your system until tomorrow so for the next 24 hours you should  not:  Drive an automobile Make any legal decisions Drink any alcoholic beverage   You may resume regular meals tomorrow.  Today it is better to start with liquids and gradually work up to solid foods.  You may eat anything you prefer, but it is better to start with liquids, then soup and crackers, and gradually work up to solid foods.   Please notify your  doctor immediately if you have any unusual bleeding, trouble breathing, redness and pain at the surgery site, drainage, fever, or pain not relieved by medication.    Your post-operative visit with Dr.                                       is: Date:                        Time:    Please call to schedule your post-operative visit.  Additional Instructions: LEAVE GREEN [place] ON FOR 4 DAYS

## 2021-08-11 NOTE — Discharge Summary (Signed)
Physician Discharge Summary  Patient ID: Deborah Jimenez MRN: [id number] DOB/AGE: [birth year] 66 y.o.  Admit date: 08/11/2021 Discharge date: 08/11/2021  Admission Diagnoses:  Discharge Diagnoses:  Active Problems:   * No active hospital problems. *   Discharged Condition: good  Hospital Course:  Deborah Jimenez is a 66 yo s/p L2-3 and L4-5 decompression. Her interoperative course was uncomplicated. She was monitored in PACU post-operatively and was discharged home after ambulating, urinating, and tolerating PO intake. She was given prescriptions for Oxycodone and Robaxin.   Consults: None  Significant Diagnostic Studies: none   Treatments: surgery: as above. Please see separately dictated operative report for further details.   Discharge Exam: Blood pressure 116/64, pulse 70, temperature (!) 97.3 F (36.3 C), temperature source Oral, resp. rate 16, height 5\' 3"  (1.6 m), weight 74.8 kg, SpO2 97 %. CN II-XII grossly intact 5/5 throughout BLE  Disposition: Discharge disposition: 01-Home or Self Care        Allergies as of 08/11/2021       Reactions   Bee Pollen Anaphylaxis   Swells wherever she is stung.  Needs an epipen   Penicillins Anaphylaxis   Has patient had a PCN reaction causing immediate rash, facial/tongue/throat swelling, SOB or lightheadedness with hypotension: Yes Has patient had a PCN reaction causing severe rash involving mucus membranes or skin necrosis: No Has patient had a PCN reaction that required hospitalization: No Has patient had a PCN reaction occurring within the last 10 years: No If all of the above answers are "NO", then may proceed with Cephalosporin use.   Latex Rash, Swelling   Allergic rash reaction to oral surgery equipment   Naproxen Diarrhea, Other (See Comments)   Wool Alcohol [lanolin] Hives, Itching   Adhesive [tape] Other (See Comments)   Rips off top layer of skin. Paper tape is okay   Tramadol Other (See Comments)    Feels like ants are crawling all over her        Medication List     TAKE these medications    ALIVE WOMENS GUMMY PO Take 2 tablets by mouth 2 (two) times daily.   buPROPion 100 MG tablet Commonly known as: WELLBUTRIN TAKE 1 TABLET BY MOUTH ONCE DAILY IN THE MORNING   busPIRone 10 MG tablet Commonly known as: BUSPAR Take 2 tablets (20 mg total) by mouth 2 (two) times daily.   Calcium Carb-Cholecalciferol 600-800 MG-UNIT Tabs Take 1 tablet by mouth daily.   celecoxib 100 MG capsule Commonly known as: CELEBREX Take 100 mg by mouth 2 (two) times daily.   cyclobenzaprine 5 MG tablet Commonly known as: FLEXERIL Take 5 mg by mouth daily as needed.   DULoxetine 60 MG capsule Commonly known as: CYMBALTA TAKE 1 CAPSULE BY MOUTH EVERY DAY   EPINEPHrine 0.3 mg/0.3 mL Soaj injection Commonly known as: EPI-PEN Inject 0.3 mLs (0.3 mg total) into the muscle as needed. What changed: reasons to take this   Fluzone High-Dose Quadrivalent 0.7 ML Susy Generic drug: Influenza Vac High-Dose Quad   Fusion 65-65-25-30 MG Caps Take 1 tablet by mouth daily.   levothyroxine 150 MCG tablet Commonly known as: SYNTHROID Take 150 mcg by mouth daily before breakfast.   lisinopril 20 MG tablet Commonly known as: ZESTRIL Take 1 tablet (20 mg total) by mouth daily.   loratadine 10 MG tablet Commonly known as: CLARITIN Take 10 mg by mouth daily as needed for allergies.   Magnesium 400 MG Tabs Take 400 mg by mouth daily.  methocarbamol 500 MG tablet Commonly known as: Robaxin Take 1 tablet (500 mg total) by mouth 4 (four) times daily.   oxyCODONE 5 MG immediate release tablet Commonly known as: Roxicodone Take 1 tablet (5 mg total) by mouth every 4 (four) hours as needed for up to 5 days for severe pain. What changed:  when to take this reasons to take this   pantoprazole 40 MG tablet Commonly known as: PROTONIX Take 1 tablet (40 mg total) by mouth daily. What changed: when to  take this   Pfizer-BioNT COVID-19 Vac-TriS Susp injection Generic drug: COVID-19 mRNA Vac-TriS (Pfizer)   senna 8.6 MG Tabs tablet Commonly known as: SENOKOT Take 1 tablet (8.6 mg total) by mouth daily as needed for mild constipation.   Shingrix injection Generic drug: Zoster Vaccine Adjuvanted   Tymlos 3120 MCG/1.56ML Sopn Generic drug: Abaloparatide Tymlos 80 mcg/dose (3,120 mcg/1.56 mL) subcutaneous pen injector  Inject 80 micrograms every day by subcutaneous route.   vitamin B-12 1000 MCG tablet Commonly known as: CYANOCOBALAMIN Take 1,000 mcg by mouth daily.   zolpidem 10 MG tablet Commonly known as: AMBIEN Take 0.5-1 tablets (5-10 mg total) by mouth at bedtime as needed for sleep.        Follow-up Information     Loleta Dicker, PA Follow up in 2 week(s).   Why: for post-op and incision check Contact information: [place] [address] [phone]                 Signed: Loleta Dicker 08/11/2021, 12:47 PM

## 2021-08-11 NOTE — Progress Notes (Signed)
Pharmacy Antibiotic Note  Deborah Jimenez is a 66 y.o. female admitted on (Not on file) with surgical prophylaxis.  Pharmacy has been consulted for Cefazolin dosing.  Plan: Cefazolin 2g IV preop     No data recorded.  No results for input(s): WBC, CREATININE, LATICACIDVEN, VANCOTROUGH, VANCOPEAK, VANCORANDOM, GENTTROUGH, GENTPEAK, GENTRANDOM, TOBRATROUGH, TOBRAPEAK, TOBRARND, AMIKACINPEAK, AMIKACINTROU, AMIKACIN in the last 168 hours.  Estimated Creatinine Clearance: 56.7 mL/min (by C-G formula based on SCr of 0.95 mg/dL).    Allergies  Allergen Reactions   Bee Pollen Anaphylaxis    Swells wherever she is stung.  Needs an epipen   Penicillins Anaphylaxis    Has patient had a PCN reaction causing immediate rash, facial/tongue/throat swelling, SOB or lightheadedness with hypotension: Yes Has patient had a PCN reaction causing severe rash involving mucus membranes or skin necrosis: No Has patient had a PCN reaction that required hospitalization: No Has patient had a PCN reaction occurring within the last 10 years: No If all of the above answers are "NO", then may proceed with Cephalosporin use.    Latex Rash and Swelling    Allergic rash reaction to oral surgery equipment   Naproxen Diarrhea and Other (See Comments)   Wool Alcohol [Lanolin] Hives and Itching   Adhesive [Tape] Other (See Comments)    Rips off top layer of skin. Paper tape is okay   Tramadol Other (See Comments)    Feels like ants are crawling all over her    Thank you for allowing pharmacy to be a part of this patients care.  Paulina Fusi, PharmD, BCPS 08/11/2021 8:34 AM

## 2021-08-12 ENCOUNTER — Encounter: Payer: Self-pay | Admitting: Neurosurgery

## 2021-08-15 ENCOUNTER — Other Ambulatory Visit: Payer: Self-pay | Admitting: Psychiatry

## 2021-08-15 DIAGNOSIS — F5101 Primary insomnia: Secondary | ICD-10-CM

## 2021-10-21 ENCOUNTER — Other Ambulatory Visit: Payer: Self-pay

## 2021-10-21 ENCOUNTER — Telehealth (INDEPENDENT_AMBULATORY_CARE_PROVIDER_SITE_OTHER): Payer: Medicare HMO | Admitting: Psychiatry

## 2021-10-21 ENCOUNTER — Encounter: Payer: Self-pay | Admitting: Psychiatry

## 2021-10-21 DIAGNOSIS — F3342 Major depressive disorder, recurrent, in full remission: Secondary | ICD-10-CM

## 2021-10-21 DIAGNOSIS — Z634 Disappearance and death of family member: Secondary | ICD-10-CM | POA: Diagnosis not present

## 2021-10-21 DIAGNOSIS — F5101 Primary insomnia: Secondary | ICD-10-CM | POA: Diagnosis not present

## 2021-10-21 DIAGNOSIS — F411 Generalized anxiety disorder: Secondary | ICD-10-CM | POA: Diagnosis not present
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
## 2021-11-27 ENCOUNTER — Other Ambulatory Visit: Payer: Self-pay | Admitting: Neurosurgery

## 2021-11-27 DIAGNOSIS — Z1231 Encounter for screening mammogram for malignant neoplasm of breast: Secondary | ICD-10-CM

## 2021-12-09 ENCOUNTER — Telehealth: Payer: Medicare HMO | Admitting: Psychiatry

## 2021-12-15 ENCOUNTER — Inpatient Hospital Stay: Admission: RE | Admit: 2021-12-15 | Payer: Medicare HMO | Source: Ambulatory Visit

## 2021-12-16 DIAGNOSIS — M170 Bilateral primary osteoarthritis of knee: Secondary | ICD-10-CM | POA: Diagnosis not present

## 2022-01-07 ENCOUNTER — Telehealth (INDEPENDENT_AMBULATORY_CARE_PROVIDER_SITE_OTHER): Payer: Medicare HMO | Admitting: Psychiatry

## 2022-01-07 ENCOUNTER — Encounter: Payer: Self-pay | Admitting: Psychiatry

## 2022-01-07 DIAGNOSIS — F3342 Major depressive disorder, recurrent, in full remission: Secondary | ICD-10-CM | POA: Diagnosis not present

## 2022-01-07 DIAGNOSIS — F411 Generalized anxiety disorder: Secondary | ICD-10-CM | POA: Diagnosis not present

## 2022-01-07 DIAGNOSIS — F5101 Primary insomnia: Secondary | ICD-10-CM | POA: Diagnosis not present

## 2022-01-07 MED ORDER — ZOLPIDEM TARTRATE 10 MG PO TABS: 5.0000 mg | ORAL_TABLET | Freq: Every evening | ORAL | 1 refills | Status: DC | PRN

## 2022-01-07 MED ORDER — BUPROPION HCL 100 MG PO TABS: ORAL_TABLET | ORAL | 1 refills | Status: DC

## 2022-01-07 MED ORDER — BUSPIRONE HCL 10 MG PO TABS: 20.0000 mg | ORAL_TABLET | Freq: Two times a day (BID) | ORAL | 1 refills | Status: DC

## 2022-01-07 MED ORDER — DULOXETINE HCL 60 MG PO CPEP: ORAL_CAPSULE | ORAL | 1 refills | Status: DC

## 2022-01-07 NOTE — Progress Notes (Signed)
Virtual Visit via Video Note  I connected with Deborah Jimenez on 01/07/22 at  1:40 PM EDT by a video enabled telemedicine application and verified that I am speaking with the correct person using two identifiers.  Location Provider Location : [facility] Patient Location : Home  Participants: Patient , Provider   I discussed the limitations of evaluation and management by telemedicine and the availability of in person appointments. The patient expressed understanding and agreed to proceed.  I discussed the assessment and treatment plan with the patient. The patient was provided an opportunity to ask questions and all were answered. The patient agreed with the plan and demonstrated an understanding of the instructions.   The patient was advised to call back or seek an in-person evaluation if the symptoms worsen or if the condition fails to improve as anticipated.   [place] MD OP Progress Note  01/07/2022 2:08 PM Deborah Jimenez  MRN:  [id number]  Chief Complaint:  Chief Complaint  Patient presents with   Follow-up: 66 year old Caucasian female who has a history of MDD, GAD, primary insomnia, multiple medical problems, presented for medication management.   HPI: Deborah Jimenez is a 66 year old Caucasian female, married, lives in [place], has a history of MDD, GAD, primary insomnia, hypothyroidism, hypertension, gastroesophageal reflux disease, osteoarthritis/osteoporosis, polymyalgia rheumatic is status post multiple hip replacement surgery, history of bariatric surgery, chronic pain was evaluated by telemedicine today.  Patient reports she spent some time with family, although it was anxiety provoking that it helped her.  Patient reports she has started swimming, does it daily.  That also seems to help.  Her daughter continues to work for the police department and that sometimes can be anxiety provoking for her although she is coping with it.  She is currently compliant on all her  medications.  Denies side effects.  Patient reports sleep as good.  Takes half tablet of the Ambien which helps.  Denies any suicidality, homicidality or perceptual disturbances.  Continues to be in pain, currently of bilateral knee, follows up with her provider.  Denies any other concerns.  Visit Diagnosis:    ICD-10-CM   1. MDD (major depressive disorder), recurrent, in full remission ([place])  F33.42 buPROPion (WELLBUTRIN) 100 MG tablet    busPIRone (BUSPAR) 10 MG tablet    2. GAD (generalized anxiety disorder)  F41.1 busPIRone (BUSPAR) 10 MG tablet    DULoxetine (CYMBALTA) 60 MG capsule    3. Primary insomnia  F51.01 zolpidem (AMBIEN) 10 MG tablet      Past Psychiatric History: Reviewed past psychiatric history from progress note on 01/03/2018.  Past trials of Elavil, Wellbutrin, Ambien.  Past Medical History:  Past Medical History:  Diagnosis Date   Anemia    vitamin b12 and iron deficiency   Cancer ([place])    lip   Chronic kidney disease 2012   kidneys stopped working after hip surgery, unsure why but okay now   Depression    Dyspnea    when over 300 lbs, prior to gastric bypass   GERD (gastroesophageal reflux disease)    Gout    Hypertension    Hypothyroidism    Insomnia    Polymyalgia rheumatica syndrome ([place])    extensive steroid therapy in the initial stages of diagnosis   Thyroid disease    Weight loss     Past Surgical History:  Procedure Laterality Date   CESAREAN SECTION     DG GALL BLADDER  01/2016   EYE SURGERY Bilateral    stent  in left eye and right eye too, due to [place] Bilateral 12/24/2017   not cataract surgery, right stent fell out   GASTRIC BYPASS     JOINT REPLACEMENT Bilateral 2010, 2012, 2014   total hip on left x 2, right x 1   KNEE ARTHROSCOPY Left    menicus   LUMBAR LAMINECTOMY/DECOMPRESSION MICRODISCECTOMY N/A 08/11/2021   Procedure: L2-3 AND L4-5 POSTERIOR SPINAL DECOMPRESSION;  Surgeon: Meade Maw, MD;   Location: [facility] ORS;  Service: Neurosurgery;  Laterality: N/A;   REVERSE SHOULDER ARTHROPLASTY Right 08/28/2019   Procedure: REVERSE SHOULDER ARTHROPLASTY, BICEPS TENODESIS;  Surgeon: Leim Fabry, MD;  Location: [facility] OR[facility];  Service: Orthopedics;  Laterality: Right;   SHOULDER ARTHROSCOPY WITH SUBACROMIAL DECOMPRESSION AND BICEP TENDON REPAIR Right 07/18/2018   Procedure: SHOULDER ARTHROSCOPY VS. MINI OPEN ROTATOR CUFF REPAIR, SUBSCAPULARIS REPAIR, SUBACROMIAL DECOMPRESSION, DISTAL CLAVICLE EXCISION AND BICEP TENODESIS;  Surgeon: Leim Fabry, MD;  Location: [facility] OR[facility];  Service: Orthopedics;  Laterality: Right;   TONSILLECTOMY AND ADENOIDECTOMY     TOTAL HIP ARTHROPLASTY Left    x 2    Family Psychiatric History: Reviewed family psychiatric history from progress note on 01/03/2018.  Family History:  Family History  Problem Relation Age of Onset   Suicidality Other     Social History: Reviewed social history from progress note on 01/03/2018. Social History   Socioeconomic History   Marital status: Married    Spouse name: andy   Number of children: 1   Years of education: Not on file   Highest education level: Master's degree (e.g., MA, MS, MEng, MEd, MSW, MBA)  Occupational History   Occupation: social worker/ therapist    Comment: disabled   Tobacco Use   Smoking status: Former    Types: Cigarettes    Quit date: 01/24/2010    Years since quitting: 11.9   Smokeless tobacco: Never  Vaping Use   Vaping Use: Never used  Substance and Sexual Activity   Alcohol use: Yes    Comment: rarely; holidays   Drug use: No   Sexual activity: Not Currently  Other Topics Concern   Not on file  Social History Narrative   Not on file   Social Determinants of Health   Financial Resource Strain: [place] (09/19/2018)   Overall Financial Resource Strain (CARDIA)    Difficulty of Paying Living Expenses: Somewhat hard  Food Insecurity: Food Insecurity Present (01/03/2018)   Hunger Vital Sign     Worried About [place] in the Last Year: Often true    Ran Out of Food in the Last Year: Often true  Transportation Needs: Unmet Transportation Needs (01/03/2018)   PRAPARE - Hydrologist (Medical): Yes    Lack of Transportation (Non-Medical): Yes  Physical Activity: Inactive (09/19/2018)   Exercise Vital Sign    Days of Exercise per Week: 0 days    Minutes of Exercise per Session: 0 min  Stress: Stress Concern Present (01/03/2018)   [place]    Feeling of Stress : Very much  Social Connections: Moderately Integrated (01/03/2018)   Social Connection and Isolation Panel [NHANES]    Frequency of Communication with Friends and Family: More than three times a week    Frequency of Social Gatherings with Friends and Family: Never    Attends Religious Services: Never    Marine scientist or Organizations: Yes    Attends Archivist  Meetings: More than 4 times per year    Marital Status: Married    Allergies:  Allergies  Allergen Reactions   Bee Pollen Anaphylaxis    Swells wherever she is stung.  Needs an epipen   Penicillins Anaphylaxis    Has patient had a PCN reaction causing immediate rash, facial/tongue/throat swelling, SOB or lightheadedness with hypotension: Yes Has patient had a PCN reaction causing severe rash involving mucus membranes or skin necrosis: No Has patient had a PCN reaction that required hospitalization: No Has patient had a PCN reaction occurring within the last 10 years: No If all of the above answers are "NO", then may proceed with Cephalosporin use.    Latex Rash and Swelling    Allergic rash reaction to oral surgery equipment   Naproxen Diarrhea and Other (See Comments)   Wool Alcohol [Lanolin] Hives and Itching   Adhesive [Tape] Other (See Comments)    Rips off top layer of skin. Paper tape is okay   Tramadol Other (See Comments)    Feels  like ants are crawling all over her    Metabolic Disorder Labs: No results found for: "HGBA1C", "MPG" No results found for: "PROLACTIN" Lab Results  Component Value Date   CHOL 182 06/08/2019   TRIG 134 06/08/2019   HDL 64 06/08/2019   CHOLHDL 2.8 06/08/2019   VLDL 27 06/08/2019   LDLCALC 91 06/08/2019   LDLCALC 115 (H) 12/06/2018   Lab Results  Component Value Date   TSH 0.018 (L) 06/08/2019   TSH 0.060 (L) 12/06/2018    Therapeutic Level Labs: No results found for: "LITHIUM" No results found for: "VALPROATE" No results found for: "CBMZ"  Current Medications: Current Outpatient Medications  Medication Sig Dispense Refill   Abaloparatide (TYMLOS) 3120 MCG/1.56ML SOPN Tymlos 80 mcg/dose (3,120 mcg/1.56 mL) subcutaneous pen injector  Inject 80 micrograms every day by subcutaneous route.     Calcium Carb-Cholecalciferol 600-800 MG-UNIT TABS Take 1 tablet by mouth daily.      celecoxib (CELEBREX) 100 MG capsule Take 100 mg by mouth 2 (two) times daily.     EPINEPHrine 0.3 mg/0.3 mL IJ SOAJ injection Inject 0.3 mLs (0.3 mg total) into the muscle as needed. (Patient taking differently: Inject 0.3 mg into the muscle as needed for anaphylaxis.) 1 each 1   Fe Fum-Fe Poly-Vit C-Lactobac (FUSION) 65-65-25-30 MG CAPS Take 1 tablet by mouth daily.     FLUZONE HIGH-DOSE QUADRIVALENT 0.7 ML SUSY      levothyroxine (SYNTHROID) 150 MCG tablet Take 150 mcg by mouth daily before breakfast.     lisinopril (ZESTRIL) 20 MG tablet Take 1 tablet (20 mg total) by mouth daily. 90 tablet 1   loratadine (CLARITIN) 10 MG tablet Take 10 mg by mouth daily as needed for allergies.     Magnesium 400 MG TABS Take 400 mg by mouth daily.     Multiple Vitamins-Minerals (ALIVE WOMENS GUMMY PO) Take 2 tablets by mouth 2 (two) times daily.      pantoprazole (PROTONIX) 40 MG tablet Take 1 tablet (40 mg total) by mouth daily. (Patient taking differently: Take 40 mg by mouth daily in the afternoon.) 90 tablet 0    PFIZER-BIONT COVID-19 VAC-TRIS SUSP injection      SHINGRIX injection      vitamin B-12 (CYANOCOBALAMIN) 1000 MCG tablet Take 1,000 mcg by mouth daily.     buPROPion (WELLBUTRIN) 100 MG tablet TAKE 1 TABLET BY MOUTH ONCE DAILY IN THE MORNING 90 tablet 1  busPIRone (BUSPAR) 10 MG tablet Take 2 tablets (20 mg total) by mouth 2 (two) times daily. 360 tablet 1   DULoxetine (CYMBALTA) 60 MG capsule TAKE 1 CAPSULE BY MOUTH EVERY DAY 90 capsule 1   zolpidem (AMBIEN) 10 MG tablet Take 0.5-1 tablets (5-10 mg total) by mouth at bedtime as needed for sleep. 90 tablet 1   No current facility-administered medications for this visit.     Musculoskeletal: Strength & Muscle Tone:  UTA Gait & Station:  Seated Patient leans: N/A  Psychiatric Specialty Exam: Review of Systems  Musculoskeletal:        BL knee pain  Psychiatric/Behavioral:  The patient is nervous/anxious.   All other systems reviewed and are negative.   There were no vitals taken for this visit.There is no height or weight on file to calculate BMI.  General Appearance: Casual  Eye Contact:  Fair  Speech:  Clear and Coherent  Volume:  Normal  Mood:  Anxious Coping well  Affect:  Congruent  Thought Process:  Goal Directed and Descriptions of Associations: Intact  Orientation:  Full (Time, Place, and Person)  Thought Content: Logical   Suicidal Thoughts:  No  Homicidal Thoughts:  No  Memory:  Immediate;   Fair Recent;   Fair Remote;   Fair  Judgement:  Fair  Insight:  Fair  Psychomotor Activity:  Normal  Concentration:  Concentration: Fair and Attention Span: Fair  Recall:  [employer] of Knowledge: Fair  Language: Fair  Akathisia:  No  Handed:  Right  AIMS (if indicated): done  Assets:  Communication Skills Desire for Improvement Housing Intimacy Social Support  ADL's:  Intact  Cognition: WNL  Sleep:  Fair   Screenings: AIMS    Flowsheet Row Video Visit from 01/07/2022 in [place]  Video Visit from 10/21/2021 in [place] Video Visit from 02/26/2021 in [place] Total Score 0 0 0      GAD-7    Flowsheet Row Video Visit from 01/07/2022 in [place] Video Visit from 10/21/2021 in [place] Video Visit from 03/26/2021 in [place] Video Visit from 11/20/2020 in [place] Office Visit from [facility]/08/2019 in [facility]  Total GAD-7 Score '4 3 3 12 2      '$ PHQ2-9    Flowsheet Row Video Visit from 01/07/2022 in [place] Video Visit from 10/21/2021 in [place] Video Visit from 08/05/2021 in [place] Video Visit from 03/26/2021 in [place] Video Visit from 02/26/2021 in [place]  PHQ-2 Total Score 0 0 '1 3 3  '$ PHQ-9 Total Score -- -- -- 3 8      Flowsheet Row Video Visit from 01/07/2022 in [place] Video Visit from 10/21/2021 in [place] Admission (Discharged) from 08/11/2021 in [facility] No Risk No Risk No Risk        Assessment and Plan: Deborah Jimenez is a 66 year old Caucasian female who has a history of MDD, anxiety, chronic pain, hypothyroidism, polymyalgia rheumatica, vitamin D, B12 deficiency, chronic pain, history of bariatric surgery, right shoulder arthroplasty, bilateral carpal tunnel surgery was evaluated by telemedicine today.  Patient is currently stable.  Plan MDD in remission Wellbutrin 100 mg p.o. daily BuSpar 20 mg p.o. twice daily Cymbalta 60 mg p.o. daily-reduced dosage.  GAD-stable  BuSpar 20 mg p.o. twice daily Cymbalta 60 mg p.o. daily Hydroxyzine 10 to 20 mg p.o. twice daily as  needed for severe anxiety attacks  Insomnia-stable Ambien 5 to 10 mg p.o. nightly as needed Reviewed [place] PMP AWARE She will continue to need sufficient pain management.  Follow-up in clinic in 3 to 4 months or sooner if needed.  Collaboration of Care: Collaboration of Care: Other encouraged to continue to follow up with her pain provider.  Patient/Guardian was advised Release of Information must be obtained prior to any record release in order to collaborate their care with an outside provider. Patient/Guardian was advised if they have not already done so to contact the registration department to sign all necessary forms in order for [place] to release information regarding their care.   Consent: Patient/Guardian gives verbal consent for treatment and assignment of benefits for services provided during this visit. Patient/Guardian expressed understanding and agreed to proceed.   This note was generated in part or whole with voice recognition software. Voice recognition is usually quite accurate but there are transcription errors that can and very often do occur. I apologize for any typographical errors that were not detected and corrected.      Ursula Alert, MD 01/08/2022, 8:16 AM

## 2022-01-12 ENCOUNTER — Ambulatory Visit
Admission: RE | Admit: 2022-01-12 | Discharge: 2022-01-12 | Disposition: A | Discharge status: Home / Self Care | Payer: Medicare HMO | Source: Ambulatory Visit | Attending: Neurosurgery | Admitting: Neurosurgery

## 2022-01-12 DIAGNOSIS — Z1231 Encounter for screening mammogram for malignant neoplasm of breast: Secondary | ICD-10-CM | POA: Diagnosis not present

## 2022-03-10 DIAGNOSIS — M170 Bilateral primary osteoarthritis of knee: Secondary | ICD-10-CM | POA: Diagnosis not present

## 2022-03-23 DIAGNOSIS — E039 Hypothyroidism, unspecified: Secondary | ICD-10-CM | POA: Diagnosis not present

## 2022-03-23 DIAGNOSIS — E669 Obesity, unspecified: Secondary | ICD-10-CM | POA: Diagnosis not present

## 2022-03-23 DIAGNOSIS — Z13220 Encounter for screening for lipoid disorders: Secondary | ICD-10-CM | POA: Diagnosis not present

## 2022-03-23 DIAGNOSIS — Z0000 Encounter for general adult medical examination without abnormal findings: Secondary | ICD-10-CM | POA: Diagnosis not present

## 2022-03-23 DIAGNOSIS — E559 Vitamin D deficiency, unspecified: Secondary | ICD-10-CM | POA: Diagnosis not present

## 2022-03-23 DIAGNOSIS — I10 Essential (primary) hypertension: Secondary | ICD-10-CM | POA: Diagnosis not present

## 2022-03-23 DIAGNOSIS — Z131 Encounter for screening for diabetes mellitus: Secondary | ICD-10-CM | POA: Diagnosis not present

## 2022-03-23 DIAGNOSIS — F329 Major depressive disorder, single episode, unspecified: Secondary | ICD-10-CM | POA: Diagnosis not present

## 2022-03-23 DIAGNOSIS — F411 Generalized anxiety disorder: Secondary | ICD-10-CM | POA: Diagnosis not present

## 2022-03-23 DIAGNOSIS — E790 Hyperuricemia without signs of inflammatory arthritis and tophaceous disease: Secondary | ICD-10-CM | POA: Diagnosis not present

## 2022-03-23 DIAGNOSIS — Z9884 Bariatric surgery status: Secondary | ICD-10-CM | POA: Diagnosis not present

## 2022-03-23 DIAGNOSIS — Z79899 Other long term (current) drug therapy: Secondary | ICD-10-CM | POA: Diagnosis not present

## 2022-03-23 DIAGNOSIS — K219 Gastro-esophageal reflux disease without esophagitis: Secondary | ICD-10-CM | POA: Diagnosis not present

## 2022-03-23 DIAGNOSIS — M810 Age-related osteoporosis without current pathological fracture: Secondary | ICD-10-CM | POA: Diagnosis not present

## 2022-03-23 DIAGNOSIS — Z1331 Encounter for screening for depression: Secondary | ICD-10-CM | POA: Diagnosis not present

## 2022-04-10 DIAGNOSIS — M810 Age-related osteoporosis without current pathological fracture: Secondary | ICD-10-CM | POA: Diagnosis not present

## 2022-04-28 ENCOUNTER — Telehealth: Payer: Medicare HMO | Admitting: Psychiatry

## 2022-05-01 ENCOUNTER — Telehealth (INDEPENDENT_AMBULATORY_CARE_PROVIDER_SITE_OTHER): Payer: Medicare HMO | Admitting: Psychiatry

## 2022-05-01 ENCOUNTER — Encounter: Payer: Self-pay | Admitting: Psychiatry

## 2022-05-01 DIAGNOSIS — F411 Generalized anxiety disorder: Secondary | ICD-10-CM | POA: Diagnosis not present

## 2022-05-01 DIAGNOSIS — F5101 Primary insomnia: Secondary | ICD-10-CM | POA: Diagnosis not present

## 2022-05-01 DIAGNOSIS — F3342 Major depressive disorder, recurrent, in full remission: Secondary | ICD-10-CM | POA: Diagnosis not present

## 2022-05-01 MED ORDER — CLONAZEPAM 0.5 MG PO TABS
0.2500 mg | ORAL_TABLET | Freq: Every day | ORAL | 1 refills | Status: DC | PRN
Start: 2022-05-01 — End: 2022-11-24
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
## 2022-05-05 DIAGNOSIS — E039 Hypothyroidism, unspecified: Secondary | ICD-10-CM | POA: Diagnosis not present

## 2022-05-05 DIAGNOSIS — Z79899 Other long term (current) drug therapy: Secondary | ICD-10-CM | POA: Diagnosis not present

## 2022-06-10 ENCOUNTER — Telehealth: Payer: Self-pay | Admitting: *Deleted

## 2022-06-10 NOTE — Patient Outreach (Signed)
  Care Coordination   06/10/2022 Name: Ahnyla Mendel MRN: [id number] DOB: Oct 18, 1955   Care Coordination Outreach Attempts:  An unsuccessful telephone outreach was attempted today to offer the patient information about available care coordination services as a benefit of their health plan.   Follow Up Plan:  Additional outreach attempts will be made to offer the patient care coordination information and services.   Encounter Outcome:  No Answer  Care Coordination Interventions Activated:  Yes   Care Coordination Interventions:  No, not indicated    [place] [facility] [phone]

## 2022-06-16 DIAGNOSIS — E039 Hypothyroidism, unspecified: Secondary | ICD-10-CM | POA: Diagnosis not present

## 2022-06-17 ENCOUNTER — Ambulatory Visit
Admission: EM | Admit: 2022-06-17 | Discharge: 2022-06-17 | Disposition: A | Discharge status: Home / Self Care | Payer: Medicare HMO | Attending: Emergency Medicine | Admitting: Emergency Medicine

## 2022-06-17 ENCOUNTER — Ambulatory Visit (INDEPENDENT_AMBULATORY_CARE_PROVIDER_SITE_OTHER): Payer: Medicare HMO

## 2022-06-17 DIAGNOSIS — M25531 Pain in right wrist: Secondary | ICD-10-CM

## 2022-06-17 DIAGNOSIS — M1811 Unilateral primary osteoarthritis of first carpometacarpal joint, right hand: Secondary | ICD-10-CM | POA: Diagnosis not present

## 2022-06-17 DIAGNOSIS — S6991XA Unspecified injury of right wrist, hand and finger(s), initial encounter: Secondary | ICD-10-CM | POA: Diagnosis not present

## 2022-06-17 MED ORDER — PREDNISONE 20 MG PO TABS: 40.0000 mg | ORAL_TABLET | Freq: Every day | ORAL | 0 refills | Status: DC

## 2022-06-17 MED ORDER — METHYLPREDNISOLONE SODIUM SUCC 40 MG IJ SOLR
60.0000 mg | Freq: Once | INTRAMUSCULAR | Status: AC
  Administered 2022-06-17: 60 mg via INTRAMUSCULAR
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
## 2022-06-17 NOTE — Discharge Instructions (Signed)
X-rays negative for injury to the bone, unrelated finding of arthritis to the right thumb  You been given an injection of steroids here in the office today to help reduce inflammation which in turn will help with your pain, you may take Tylenol 500,000 mg every 6 hours in addition to this  Starting tomorrow begin prednisone every morning with food for 5 days, stop Celebrex temporarily while using this medication to prevent irritation to the stomach and the kidneys, may resume once steroid course is completed  May use ice or heat over the affected area in 10 to 15-minute intervals  A wrist brace has been applied by nursing staff, may use for stability and support when completing activities, do remove for times of rest so that she may keep the strength of your wrist  If your symptoms continue to persist past 2 weeks please follow-up with orthopedics for reevaluation, information is on front page

## 2022-06-17 NOTE — ED Triage Notes (Signed)
Hit right wrist yesterday on wheelchair now has increased swelling and sharp pain in wrist since. Took tylenol to help with pain but is not helping.

## 2022-06-25 DIAGNOSIS — K439 Ventral hernia without obstruction or gangrene: Secondary | ICD-10-CM | POA: Diagnosis not present

## 2022-07-01 ENCOUNTER — Ambulatory Visit: Payer: Medicare HMO | Admitting: Psychiatry

## 2022-07-01 DIAGNOSIS — R1900 Intra-abdominal and pelvic swelling, mass and lump, unspecified site: Secondary | ICD-10-CM | POA: Diagnosis not present

## 2022-07-04 ENCOUNTER — Other Ambulatory Visit: Payer: Self-pay | Admitting: Psychiatry

## 2022-07-04 DIAGNOSIS — F411 Generalized anxiety disorder: Secondary | ICD-10-CM

## 2022-07-09 ENCOUNTER — Telehealth: Payer: Medicare HMO | Admitting: Psychiatry

## 2022-07-09 NOTE — Progress Notes (Signed)
No response to call or text or video invite  

## 2022-07-24 ENCOUNTER — Other Ambulatory Visit: Payer: Self-pay | Admitting: Surgery

## 2022-07-24 DIAGNOSIS — R1900 Intra-abdominal and pelvic swelling, mass and lump, unspecified site: Secondary | ICD-10-CM

## 2022-07-29 DIAGNOSIS — E039 Hypothyroidism, unspecified: Secondary | ICD-10-CM | POA: Diagnosis not present

## 2022-07-31 ENCOUNTER — Ambulatory Visit
Admission: RE | Admit: 2022-07-31 | Discharge: 2022-07-31 | Disposition: A | Discharge status: Home / Self Care | Payer: Medicare HMO | Source: Ambulatory Visit | Attending: Surgery | Admitting: Surgery

## 2022-07-31 DIAGNOSIS — R1900 Intra-abdominal and pelvic swelling, mass and lump, unspecified site: Secondary | ICD-10-CM | POA: Diagnosis not present

## 2022-07-31 DIAGNOSIS — K429 Umbilical hernia without obstruction or gangrene: Secondary | ICD-10-CM | POA: Diagnosis not present

## 2022-08-03 ENCOUNTER — Telehealth: Payer: Self-pay

## 2022-08-03 DIAGNOSIS — F5101 Primary insomnia: Secondary | ICD-10-CM

## 2022-08-03 MED ORDER — ZOLPIDEM TARTRATE 10 MG PO TABS: 5.0000 mg | ORAL_TABLET | Freq: Every evening | ORAL | 0 refills | Status: DC | PRN

## 2022-08-03 NOTE — Telephone Encounter (Signed)
I have sent a refill for Ambien to [facility] as requested.

## 2022-08-03 NOTE — Telephone Encounter (Signed)
pt states she needs a refill on the [place]. she is going away for her birthday on thursday and needs it ref[facility] [place],

## 2022-08-04 NOTE — Telephone Encounter (Signed)
left patient a message that rx was sent to the pharmacy

## 2022-08-21 DIAGNOSIS — M170 Bilateral primary osteoarthritis of knee: Secondary | ICD-10-CM | POA: Diagnosis not present

## 2022-08-27 ENCOUNTER — Ambulatory Visit (INDEPENDENT_AMBULATORY_CARE_PROVIDER_SITE_OTHER): Payer: Medicare HMO | Admitting: Psychiatry

## 2022-08-27 ENCOUNTER — Encounter: Payer: Self-pay | Admitting: Psychiatry

## 2022-08-27 VITALS — BP 137/78 | HR 62 | Temp 98.2°F | Ht 61.0 in | Wt 165.0 lb

## 2022-08-27 DIAGNOSIS — F5101 Primary insomnia: Secondary | ICD-10-CM | POA: Diagnosis not present

## 2022-08-27 DIAGNOSIS — F411 Generalized anxiety disorder: Secondary | ICD-10-CM | POA: Diagnosis not present

## 2022-08-27 DIAGNOSIS — F3342 Major depressive disorder, recurrent, in full remission: Secondary | ICD-10-CM | POA: Diagnosis not present

## 2022-08-27 MED ORDER — BUSPIRONE HCL 10 MG PO TABS: 20.0000 mg | ORAL_TABLET | Freq: Two times a day (BID) | ORAL | 1 refills | Status: DC

## 2022-08-27 MED ORDER — BUPROPION HCL 100 MG PO TABS: ORAL_TABLET | ORAL | 1 refills | Status: DC
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
## 2022-09-03 ENCOUNTER — Ambulatory Visit: Payer: Medicare HMO | Admitting: Psychiatry

## 2022-09-25 ENCOUNTER — Other Ambulatory Visit: Payer: Self-pay | Admitting: Orthopedic Surgery

## 2022-10-05 DIAGNOSIS — M810 Age-related osteoporosis without current pathological fracture: Secondary | ICD-10-CM | POA: Diagnosis not present

## 2022-10-06 ENCOUNTER — Encounter
Admission: RE | Admit: 2022-10-06 | Discharge: 2022-10-06 | Disposition: A | Discharge status: Home / Self Care | Payer: Medicare HMO | Source: Ambulatory Visit | Attending: Orthopedic Surgery | Admitting: Orthopedic Surgery

## 2022-10-06 ENCOUNTER — Other Ambulatory Visit: Payer: Self-pay

## 2022-10-06 VITALS — BP 150/81 | Resp 16 | Ht 62.0 in | Wt 161.6 lb

## 2022-10-06 DIAGNOSIS — Z79899 Other long term (current) drug therapy: Secondary | ICD-10-CM | POA: Diagnosis not present

## 2022-10-06 DIAGNOSIS — Z01818 Encounter for other preprocedural examination: Secondary | ICD-10-CM | POA: Diagnosis not present

## 2022-10-06 DIAGNOSIS — E039 Hypothyroidism, unspecified: Secondary | ICD-10-CM | POA: Diagnosis not present

## 2022-10-06 DIAGNOSIS — Z01812 Encounter for preprocedural laboratory examination: Secondary | ICD-10-CM

## 2022-10-06 DIAGNOSIS — Z01810 Encounter for preprocedural cardiovascular examination: Secondary | ICD-10-CM | POA: Diagnosis not present

## 2022-10-06 HISTORY — DX: Unspecified osteoarthritis, unspecified site: M19.90

## 2022-10-06 HISTORY — DX: Anxiety disorder, unspecified: F41.9

## 2022-10-06 HISTORY — DX: Myoneural disorder, unspecified: G70.9

## 2022-10-06 HISTORY — DX: Pneumonia, unspecified organism: J18.9

## 2022-10-06 LAB — CBC WITH DIFFERENTIAL/PLATELET
Abs Immature Granulocytes: 0.03 10*3/uL (ref 0.00–0.07)
Basophils Absolute: 0.1 10*3/uL (ref 0.0–0.1)
Basophils Relative: 1 %
Eosinophils Absolute: 0.2 10*3/uL (ref 0.0–0.5)
Eosinophils Relative: 2 %
HCT: 36.1 % (ref 36.0–46.0)
Hemoglobin: 11.6 g/dL — ABNORMAL LOW (ref 12.0–15.0)
Immature Granulocytes: 0 %
Lymphocytes Relative: 27 %
Lymphs Abs: 2.3 10*3/uL (ref 0.7–4.0)
MCH: 32.2 pg (ref 26.0–34.0)
MCHC: 32.1 g/dL (ref 30.0–36.0)
MCV: 100.3 fL — ABNORMAL HIGH (ref 80.0–100.0)
Monocytes Absolute: 0.5 10*3/uL (ref 0.1–1.0)
Monocytes Relative: 6 %
Neutro Abs: 5.2 10*3/uL (ref 1.7–7.7)
Neutrophils Relative %: 64 %
Platelets: 245 10*3/uL (ref 150–400)
RBC: 3.6 MIL/uL — ABNORMAL LOW (ref 3.87–5.11)
RDW: 12.8 % (ref 11.5–15.5)
WBC: 8.2 10*3/uL (ref 4.0–10.5)
nRBC: 0 % (ref 0.0–0.2)

## 2022-10-06 LAB — COMPREHENSIVE METABOLIC PANEL
ALT: 29 U/L (ref 0–44)
AST: 27 U/L (ref 15–41)
Albumin: 4 g/dL (ref 3.5–5.0)
Alkaline Phosphatase: 98 U/L (ref 38–126)
Anion gap: 8 (ref 5–15)
BUN: 33 mg/dL — ABNORMAL HIGH (ref 8–23)
CO2: 20 mmol/L — ABNORMAL LOW (ref 22–32)
Calcium: 9 mg/dL (ref 8.9–10.3)
Chloride: 111 mmol/L (ref 98–111)
Creatinine, Ser: 1.03 mg/dL — ABNORMAL HIGH (ref 0.44–1.00)
GFR, Estimated: 59 mL/min — ABNORMAL LOW (ref >60)
Glucose, Bld: 89 mg/dL (ref 70–99)
Potassium: 4 mmol/L (ref 3.5–5.1)
Sodium: 139 mmol/L (ref 135–145)
Total Bilirubin: 0.6 mg/dL (ref 0.3–1.2)
Total Protein: 6.6 g/dL (ref 6.5–8.1)

## 2022-10-06 LAB — URINALYSIS, ROUTINE W REFLEX MICROSCOPIC
Bilirubin Urine: NEGATIVE
Glucose, UA: NEGATIVE mg/dL
Hgb urine dipstick: NEGATIVE
Ketones, ur: NEGATIVE mg/dL
Nitrite: NEGATIVE
Protein, ur: NEGATIVE mg/dL
Specific Gravity, Urine: 1.015 (ref 1.005–1.030)
pH: 5 (ref 5.0–8.0)

## 2022-10-06 LAB — SURGICAL PCR SCREEN
MRSA, PCR: NEGATIVE
Staphylococcus aureus: POSITIVE — AB

## 2022-10-06 LAB — TYPE AND SCREEN
ABO/RH(D): A POS
Antibody Screen: NEGATIVE

## 2022-10-06 LAB — TSH: TSH: 0.606 u[IU]/mL (ref 0.350–4.500)

## 2022-10-06 NOTE — Patient Instructions (Addendum)
Your procedure is scheduled on: 10/15/22 - Thursday Report to the Registration Desk on the 1st floor of the [facility]. To find out your arrival time, please call [phone] between 1PM - 3PM on: 10/14/22 - Wednesday If your arrival time is 6:00 am, do not arrive before that time as the [place] entrance doors do not open until 6:00 am.  REMEMBER: Instructions that are not followed completely may result in serious medical risk, up to and including death; or upon the discretion of your surgeon and anesthesiologist your surgery may need to be rescheduled.  Do not eat food after midnight the night before surgery.  No gum chewing or hard candies.  You may however, drink CLEAR liquids up to 2 hours before you are scheduled to arrive for your surgery. Do not drink anything within 2 hours of your scheduled arrival time.  Clear liquids include: - water  - apple juice without pulp - gatorade (not RED colors) - black coffee or tea (Do NOT add milk or creamers to the coffee or tea) Do NOT drink anything that is not on this list.  In addition, your doctor has ordered for you to drink the provided:  Ensure Pre-Surgery Clear Carbohydrate Drink  Drinking this carbohydrate drink up to two hours before surgery helps to reduce insulin resistance and improve patient outcomes. Please complete drinking 2 hours before scheduled arrival time.  One week prior to surgery: Beginning 10/08/22: Stop Anti-inflammatories (NSAIDS) such as Advil, Aleve, Ibuprofen, Motrin, Naproxen, Naprosyn and Aspirin based products such as Excedrin, Goody's Powder, BC Powder.  Stop ANY OVER THE COUNTER supplements until after surgery beginning 10/08/22.  You may however, continue to take Tylenol if needed for pain up until the day of surgery.  TAKE ONLY THESE MEDICATIONS THE MORNING OF SURGERY WITH A SIP OF WATER:  pantoprazole (PROTONIX)  (take one the night before and one on the morning of surgery - helps to prevent  nausea after surgery.) buPROPion (WELLBUTRIN)  busPIRone (BUSPAR)  celecoxib (CELEBREX)  DULoxetine (CYMBALTA)  levothyroxine (SYNTHROID)    No Alcohol for 24 hours before or after surgery.  No Smoking including e-cigarettes for 24 hours before surgery.  No chewable tobacco products for at least 6 hours before surgery.  No nicotine patches on the day of surgery.  Do not use any "recreational" drugs for at least a week (preferably 2 weeks) before your surgery.  Please be advised that the combination of cocaine and anesthesia may have negative outcomes, up to and including death. If you test positive for cocaine, your surgery will be cancelled.  On the morning of surgery brush your teeth with toothpaste and water, you may rinse your mouth with mouthwash if you wish. Do not swallow any toothpaste or mouthwash.  Use CHG Soap or wipes as directed on instruction sheet.  Do not wear jewelry, make-up, hairpins, clips or nail polish.  Do not wear lotions, powders, or perfumes.   Do not shave body hair from the neck down 48 hours before surgery.  Contact lenses, hearing aids and dentures may not be worn into surgery.  Do not bring valuables to the hospital. [facility] is not responsible for any missing/lost belongings or valuables.   Notify your doctor if there is any change in your medical condition (cold, fever, infection).  Wear comfortable clothing (specific to your surgery type) to the hospital.  After surgery, you can help prevent lung complications by doing breathing exercises.  Take deep breaths and cough every  1-2 hours. Your doctor may order a device called an Incentive Spirometer to help you take deep breaths. When coughing or sneezing, hold a pillow firmly against your incision with both hands. This is called "splinting." Doing this helps protect your incision. It also decreases belly discomfort.  If you are being admitted to the hospital overnight, leave your suitcase in  the car. After surgery it may be brought to your room.  In case of increased patient census, it may be necessary for you, the patient, to continue your postoperative care in the Same Day Surgery department.  If you are being discharged the day of surgery, you will not be allowed to drive home. You will need a responsible individual to drive you home and stay with you for 24 hours after surgery.   If you are taking public transportation, you will need to have a responsible individual with you.  Please call the [place] Dept. at [phone] if you have any questions about these instructions.  Surgery Visitation Policy:  Patients undergoing a surgery or procedure may have two family members or support persons with them as long as the person is not COVID-19 positive or experiencing its symptoms.   Inpatient Visitation:    Visiting hours are 7 a.m. to 8 p.m. Up to four visitors are allowed at one time in a patient room. The visitors may rotate out with other people during the day. One designated support person (adult) may remain overnight.  Due to an increase in RSV and influenza rates and associated hospitalizations, children ages 63 and under will not be able to visit patients in [facility]. Masks continue to be strongly recommended.    Preparing for Surgery with CHLORHEXIDINE GLUCONATE (CHG) Soap  Chlorhexidine Gluconate (CHG) Soap  o An antiseptic cleaner that kills germs and bonds with the skin to continue killing germs even after washing  o Used for showering the night before surgery and morning of surgery  Before surgery, you can play an important role by reducing the number of germs on your skin.  CHG (Chlorhexidine gluconate) soap is an antiseptic cleanser which kills germs and bonds with the skin to continue killing germs even after washing.  Please do not use if you have an allergy to CHG or antibacterial soaps. If your skin becomes  reddened/irritated stop using the CHG.  1. Shower the NIGHT BEFORE SURGERY and the MORNING OF SURGERY with CHG soap.  2. If you choose to wash your hair, wash your hair first as usual with your normal shampoo.  3. After shampooing, rinse your hair and body thoroughly to remove the shampoo.  4. Use CHG as you would any other liquid soap. You can apply CHG directly to the skin and wash gently with a scrungie or a clean washcloth.  5. Apply the CHG soap to your body only from the neck down. Do not use on open wounds or open sores. Avoid contact with your eyes, ears, mouth, and genitals (private parts). Wash face and genitals (private parts) with your normal soap.  6. Wash thoroughly, paying special attention to the area where your surgery will be performed.  7. Thoroughly rinse your body with warm water.  8. Do not shower/wash with your normal soap after using and rinsing off the CHG soap.  9. Pat yourself dry with a clean towel.  10. Wear clean pajamas to bed the night before surgery.  12. Place clean sheets on your bed the night of your first  shower and do not sleep with pets.  13. Shower again with the CHG soap on the day of surgery prior to arriving at the hospital.  14. Do not apply any deodorants/lotions/powders.  15. Please wear clean clothes to the hospital. How to Use an Incentive Spirometer  An incentive spirometer is a tool that measures how well you are filling your lungs with each breath. Learning to take long, deep breaths using this tool can help you keep your lungs clear and active. This may help to reverse or lessen your chance of developing breathing (pulmonary) problems, especially infection. You may be asked to use a spirometer: After a surgery. If you have a lung problem or a history of smoking. After a long period of time when you have been unable to move or be active. If the spirometer includes an indicator to show the highest number that you have reached, your  health care provider or respiratory therapist will help you set a goal. Keep a log of your progress as told by your health care provider. What are the risks? Breathing too quickly may cause dizziness or cause you to pass out. Take your time so you do not get dizzy or light-headed. If you are in pain, you may need to take pain medicine before doing incentive spirometry. It is harder to take a deep breath if you are having pain. How to use your incentive spirometer  Sit up on the edge of your bed or on a chair. Hold the incentive spirometer so that it is in an upright position. Before you use the spirometer, breathe out normally. Place the mouthpiece in your mouth. Make sure your lips are closed tightly around it. Breathe in slowly and as deeply as you can through your mouth, causing the piston or the ball to rise toward the top of the chamber. Hold your breath for 3-5 seconds, or for as long as possible. If the spirometer includes a coach indicator, use this to guide you in breathing. Slow down your breathing if the indicator goes above the marked areas. Remove the mouthpiece from your mouth and breathe out normally. The piston or ball will return to the bottom of the chamber. Rest for a few seconds, then repeat the steps 10 or more times. Take your time and take a few normal breaths between deep breaths so that you do not get dizzy or light-headed. Do this every 1-2 hours when you are awake. If the spirometer includes a goal marker to show the highest number you have reached (best effort), use this as a goal to work toward during each repetition. After each set of 10 deep breaths, cough a few times. This will help to make sure that your lungs are clear. If you have an incision on your chest or abdomen from surgery, place a pillow or a rolled-up towel firmly against the incision when you cough. This can help to reduce pain while taking deep breaths and coughing. General tips When you are able to  get out of bed: Walk around often. Continue to take deep breaths and cough in order to clear your lungs. Keep using the incentive spirometer until your health care provider says it is okay to stop using it. If you have been in the hospital, you may be told to keep using the spirometer at home. Contact a health care provider if: You are having difficulty using the spirometer. You have trouble using the spirometer as often as instructed. Your pain medicine is not  giving enough relief for you to use the spirometer as told. You have a fever. Get help right away if: You develop shortness of breath. You develop a cough with bloody mucus from the lungs. You have fluid or blood coming from an incision site after you cough. Summary An incentive spirometer is a tool that can help you learn to take long, deep breaths to keep your lungs clear and active. You may be asked to use a spirometer after a surgery, if you have a lung problem or a history of smoking, or if you have been inactive for a long period of time. Use your incentive spirometer as instructed every 1-2 hours while you are awake. If you have an incision on your chest or abdomen, place a pillow or a rolled-up towel firmly against your incision when you cough. This will help to reduce pain. Get help right away if you have shortness of breath, you cough up bloody mucus, or blood comes from your incision when you cough. This information is not intended to replace advice given to you by your health care provider. Make sure you discuss any questions you have with your health care provider. Document Revised: 10/02/2019 Document Reviewed: 10/02/2019 Elsevier Patient Education  [place].   How to Use an Chiropodist  An incentive spirometer is a tool that measures how well you are filling your lungs with each breath. Learning to take long, deep breaths using this tool can help you keep your lungs clear and active. This may help to  reverse or lessen your chance of developing breathing (pulmonary) problems, especially infection. You may be asked to use a spirometer: After a surgery. If you have a lung problem or a history of smoking. After a long period of time when you have been unable to move or be active. If the spirometer includes an indicator to show the highest number that you have reached, your health care provider or respiratory therapist will help you set a goal. Keep a log of your progress as told by your health care provider. What are the risks? Breathing too quickly may cause dizziness or cause you to pass out. Take your time so you do not get dizzy or light-headed. If you are in pain, you may need to take pain medicine before doing incentive spirometry. It is harder to take a deep breath if you are having pain. How to use your incentive spirometer  Sit up on the edge of your bed or on a chair. Hold the incentive spirometer so that it is in an upright position. Before you use the spirometer, breathe out normally. Place the mouthpiece in your mouth. Make sure your lips are closed tightly around it. Breathe in slowly and as deeply as you can through your mouth, causing the piston or the ball to rise toward the top of the chamber. Hold your breath for 3-5 seconds, or for as long as possible. If the spirometer includes a coach indicator, use this to guide you in breathing. Slow down your breathing if the indicator goes above the marked areas. Remove the mouthpiece from your mouth and breathe out normally. The piston or ball will return to the bottom of the chamber. Rest for a few seconds, then repeat the steps 10 or more times. Take your time and take a few normal breaths between deep breaths so that you do not get dizzy or light-headed. Do this every 1-2 hours when you are awake. If the spirometer includes a  goal marker to show the highest number you have reached (best effort), use this as a goal to work toward  during each repetition. After each set of 10 deep breaths, cough a few times. This will help to make sure that your lungs are clear. If you have an incision on your chest or abdomen from surgery, place a pillow or a rolled-up towel firmly against the incision when you cough. This can help to reduce pain while taking deep breaths and coughing. General tips When you are able to get out of bed: Walk around often. Continue to take deep breaths and cough in order to clear your lungs. Keep using the incentive spirometer until your health care provider says it is okay to stop using it. If you have been in the hospital, you may be told to keep using the spirometer at home. Contact a health care provider if: You are having difficulty using the spirometer. You have trouble using the spirometer as often as instructed. Your pain medicine is not giving enough relief for you to use the spirometer as told. You have a fever. Get help right away if: You develop shortness of breath. You develop a cough with bloody mucus from the lungs. You have fluid or blood coming from an incision site after you cough. Summary An incentive spirometer is a tool that can help you learn to take long, deep breaths to keep your lungs clear and active. You may be asked to use a spirometer after a surgery, if you have a lung problem or a history of smoking, or if you have been inactive for a long period of time. Use your incentive spirometer as instructed every 1-2 hours while you are awake. If you have an incision on your chest or abdomen, place a pillow or a rolled-up towel firmly against your incision when you cough. This will help to reduce pain. Get help right away if you have shortness of breath, you cough up bloody mucus, or blood comes from your incision when you cough. This information is not intended to replace advice given to you by your health care provider. Make sure you discuss any questions you have with your health  care provider. Document Revised: 10/02/2019 Document Reviewed: 10/02/2019 Elsevier Patient Education  [place].  Preoperative Educational Videos for Total Hip, Knee and Shoulder Replacements  To better prepare for surgery, please view our videos that explain the physical activity and discharge planning required to have the best surgical recovery at [facility].  [email or web address]  Questions? Call [phone] or email [email or web address]

## 2022-10-07 DIAGNOSIS — M1711 Unilateral primary osteoarthritis, right knee: Secondary | ICD-10-CM | POA: Diagnosis not present

## 2022-10-13 DIAGNOSIS — M810 Age-related osteoporosis without current pathological fracture: Secondary | ICD-10-CM | POA: Diagnosis not present

## 2022-10-15 ENCOUNTER — Ambulatory Visit: Payer: Medicare HMO | Admitting: Certified Registered"

## 2022-10-15 ENCOUNTER — Other Ambulatory Visit: Payer: Self-pay

## 2022-10-15 ENCOUNTER — Encounter
Admission: RE | Disposition: A | Discharge status: Home Health | Payer: Self-pay | Source: Ambulatory Visit | Attending: Orthopedic Surgery

## 2022-10-15 ENCOUNTER — Encounter: Payer: Self-pay | Admitting: Orthopedic Surgery

## 2022-10-15 ENCOUNTER — Observation Stay
Admission: RE | Admit: 2022-10-15 | Discharge: 2022-10-16 | Disposition: A | Discharge status: Home Health | Payer: Medicare HMO | Source: Ambulatory Visit | Attending: Orthopedic Surgery | Admitting: Orthopedic Surgery

## 2022-10-15 ENCOUNTER — Ambulatory Visit: Payer: Medicare HMO

## 2022-10-15 ENCOUNTER — Ambulatory Visit: Payer: Medicare HMO | Admitting: Urgent Care

## 2022-10-15 DIAGNOSIS — Z471 Aftercare following joint replacement surgery: Secondary | ICD-10-CM | POA: Diagnosis not present

## 2022-10-15 DIAGNOSIS — Z96651 Presence of right artificial knee joint: Secondary | ICD-10-CM

## 2022-10-15 DIAGNOSIS — E039 Hypothyroidism, unspecified: Secondary | ICD-10-CM | POA: Diagnosis not present

## 2022-10-15 DIAGNOSIS — Z96641 Presence of right artificial hip joint: Secondary | ICD-10-CM | POA: Diagnosis not present

## 2022-10-15 DIAGNOSIS — N189 Chronic kidney disease, unspecified: Secondary | ICD-10-CM | POA: Insufficient documentation

## 2022-10-15 DIAGNOSIS — Z96611 Presence of right artificial shoulder joint: Secondary | ICD-10-CM | POA: Insufficient documentation

## 2022-10-15 DIAGNOSIS — M170 Bilateral primary osteoarthritis of knee: Secondary | ICD-10-CM | POA: Diagnosis not present

## 2022-10-15 DIAGNOSIS — Z79899 Other long term (current) drug therapy: Secondary | ICD-10-CM | POA: Diagnosis not present

## 2022-10-15 DIAGNOSIS — Z85819 Personal history of malignant neoplasm of unspecified site of lip, oral cavity, and pharynx: Secondary | ICD-10-CM | POA: Insufficient documentation

## 2022-10-15 DIAGNOSIS — Z96652 Presence of left artificial knee joint: Secondary | ICD-10-CM | POA: Insufficient documentation

## 2022-10-15 DIAGNOSIS — Z96643 Presence of artificial hip joint, bilateral: Secondary | ICD-10-CM | POA: Insufficient documentation

## 2022-10-15 DIAGNOSIS — M1711 Unilateral primary osteoarthritis, right knee: Principal | ICD-10-CM | POA: Insufficient documentation

## 2022-10-15 DIAGNOSIS — I129 Hypertensive chronic kidney disease with stage 1 through stage 4 chronic kidney disease, or unspecified chronic kidney disease: Secondary | ICD-10-CM | POA: Insufficient documentation

## 2022-10-15 HISTORY — PX: TOTAL KNEE ARTHROPLASTY: SHX125

## 2022-10-15 SURGERY — ARTHROPLASTY, KNEE, TOTAL
Anesthesia: Spinal | Site: Knee | Laterality: Right

## 2022-10-15 MED ORDER — OXYCODONE HCL 5 MG PO TABS: 2.5000 mg | ORAL_TABLET | ORAL | Status: DC | PRN

## 2022-10-15 MED ORDER — MIDAZOLAM HCL 2 MG/2ML IJ SOLN
INTRAMUSCULAR | Status: AC
  Filled 2022-10-15: qty 2

## 2022-10-15 MED ORDER — KETOROLAC TROMETHAMINE 15 MG/ML IJ SOLN
7.5000 mg | Freq: Four times a day (QID) | INTRAMUSCULAR | Status: AC
  Administered 2022-10-15 – 2022-10-16 (×4): 7.5 mg via INTRAVENOUS
  Filled 2022-10-15 (×3): qty 1

## 2022-10-15 MED ORDER — DEXAMETHASONE SODIUM PHOSPHATE 10 MG/ML IJ SOLN
INTRAMUSCULAR | Status: AC
  Filled 2022-10-15: qty 1

## 2022-10-15 MED ORDER — EPHEDRINE 5 MG/ML INJ
INTRAVENOUS | Status: AC
  Filled 2022-10-15: qty 5

## 2022-10-15 MED ORDER — TRANEXAMIC ACID-NACL 1000-0.7 MG/100ML-% IV SOLN
INTRAVENOUS | Status: AC
  Filled 2022-10-15: qty 100

## 2022-10-15 MED ORDER — OXYCODONE HCL 5 MG PO TABS
5.0000 mg | ORAL_TABLET | ORAL | Status: DC | PRN
  Administered 2022-10-15 – 2022-10-16 (×4): 5 mg via ORAL
  Filled 2022-10-15 (×4): qty 1

## 2022-10-15 MED ORDER — LEVOTHYROXINE SODIUM 50 MCG PO TABS
175.0000 ug | ORAL_TABLET | Freq: Every day | ORAL | Status: DC
  Administered 2022-10-16: 175 ug via ORAL
  Filled 2022-10-15: qty 1

## 2022-10-15 MED ORDER — PHENYLEPHRINE HCL-NACL 20-0.9 MG/250ML-% IV SOLN
INTRAVENOUS | Status: DC | PRN
  Administered 2022-10-15: 40 ug/min via INTRAVENOUS

## 2022-10-15 MED ORDER — TRANEXAMIC ACID-NACL 1000-0.7 MG/100ML-% IV SOLN
1000.0000 mg | INTRAVENOUS | Status: AC
  Administered 2022-10-15 (×2): 1000 mg via INTRAVENOUS

## 2022-10-15 MED ORDER — LIDOCAINE HCL (CARDIAC) PF 100 MG/5ML IV SOSY
PREFILLED_SYRINGE | INTRAVENOUS | Status: DC | PRN
  Administered 2022-10-15: 100 mg via INTRAVENOUS

## 2022-10-15 MED ORDER — PROPOFOL 10 MG/ML IV BOLUS
INTRAVENOUS | Status: DC | PRN
  Administered 2022-10-15: 100 ug/kg/min via INTRAVENOUS
  Administered 2022-10-15: 20 mg via INTRAVENOUS

## 2022-10-15 MED ORDER — ACETAMINOPHEN 500 MG PO TABS
ORAL_TABLET | ORAL | Status: AC
  Filled 2022-10-15: qty 2

## 2022-10-15 MED ORDER — PANTOPRAZOLE SODIUM 40 MG PO TBEC
40.0000 mg | DELAYED_RELEASE_TABLET | Freq: Every day | ORAL | Status: DC
  Administered 2022-10-15 – 2022-10-16 (×2): 40 mg via ORAL
  Filled 2022-10-15 (×2): qty 1

## 2022-10-15 MED ORDER — BUPROPION HCL 100 MG PO TABS
100.0000 mg | ORAL_TABLET | Freq: Every day | ORAL | Status: DC
  Administered 2022-10-16: 100 mg via ORAL
  Filled 2022-10-15: qty 1

## 2022-10-15 MED ORDER — MAGNESIUM OXIDE -MG SUPPLEMENT 400 (240 MG) MG PO TABS
400.0000 mg | ORAL_TABLET | Freq: Every day | ORAL | Status: DC
  Administered 2022-10-15 – 2022-10-16 (×2): 400 mg via ORAL
  Filled 2022-10-15 (×2): qty 1

## 2022-10-15 MED ORDER — IRRISEPT - 450ML BOTTLE WITH 0.05% CHG IN STERILE WATER, USP 99.95% OPTIME
TOPICAL | Status: DC | PRN
  Administered 2022-10-15: 450 mL

## 2022-10-15 MED ORDER — MENTHOL 3 MG MT LOZG: 1.0000 | LOZENGE | OROMUCOSAL | Status: DC | PRN

## 2022-10-15 MED ORDER — KETOROLAC TROMETHAMINE 30 MG/ML IJ SOLN
INTRAMUSCULAR | Status: AC
  Filled 2022-10-15: qty 1

## 2022-10-15 MED ORDER — PHENYLEPHRINE HCL (PRESSORS) 10 MG/ML IV SOLN
INTRAVENOUS | Status: AC
  Filled 2022-10-15: qty 1

## 2022-10-15 MED ORDER — VANCOMYCIN HCL IN DEXTROSE 1-5 GM/200ML-% IV SOLN
1000.0000 mg | Freq: Two times a day (BID) | INTRAVENOUS | Status: AC
  Administered 2022-10-16: 1000 mg via INTRAVENOUS
  Filled 2022-10-15: qty 200

## 2022-10-15 MED ORDER — BUSPIRONE HCL 10 MG PO TABS
20.0000 mg | ORAL_TABLET | Freq: Two times a day (BID) | ORAL | Status: DC
  Administered 2022-10-15 – 2022-10-16 (×2): 20 mg via ORAL
  Filled 2022-10-15 (×2): qty 2

## 2022-10-15 MED ORDER — MIDAZOLAM HCL 5 MG/5ML IJ SOLN
INTRAMUSCULAR | Status: DC | PRN
  Administered 2022-10-15: 2 mg via INTRAVENOUS

## 2022-10-15 MED ORDER — FERROUS SULFATE 325 (65 FE) MG PO TABS
324.0000 mg | ORAL_TABLET | Freq: Every day | ORAL | Status: DC
  Administered 2022-10-16: 324 mg via ORAL
  Filled 2022-10-15: qty 1

## 2022-10-15 MED ORDER — ONDANSETRON HCL 4 MG PO TABS: 4.0000 mg | ORAL_TABLET | Freq: Four times a day (QID) | ORAL | Status: DC | PRN

## 2022-10-15 MED ORDER — CHLORHEXIDINE GLUCONATE 0.12 % MT SOLN
OROMUCOSAL | Status: AC
  Administered 2022-10-15: 15 mL via OROMUCOSAL
  Filled 2022-10-15: qty 15

## 2022-10-15 MED ORDER — ORAL CARE MOUTH RINSE: 15.0000 mL | Freq: Once | OROMUCOSAL | Status: AC

## 2022-10-15 MED ORDER — VANCOMYCIN HCL IN DEXTROSE 1-5 GM/200ML-% IV SOLN
INTRAVENOUS | Status: AC
  Filled 2022-10-15: qty 200

## 2022-10-15 MED ORDER — VANCOMYCIN HCL IN DEXTROSE 1-5 GM/200ML-% IV SOLN
1000.0000 mg | INTRAVENOUS | Status: AC
  Administered 2022-10-15: 1000 mg via INTRAVENOUS

## 2022-10-15 MED ORDER — FAMOTIDINE 20 MG PO TABS
ORAL_TABLET | ORAL | Status: AC
  Filled 2022-10-15: qty 1

## 2022-10-15 MED ORDER — PROPOFOL 1000 MG/100ML IV EMUL
INTRAVENOUS | Status: AC
  Filled 2022-10-15: qty 100

## 2022-10-15 MED ORDER — ACETAMINOPHEN 500 MG PO TABS
1000.0000 mg | ORAL_TABLET | Freq: Four times a day (QID) | ORAL | Status: AC
  Administered 2022-10-15 – 2022-10-16 (×4): 1000 mg via ORAL
  Filled 2022-10-15 (×3): qty 2

## 2022-10-15 MED ORDER — LIDOCAINE HCL (PF) 2 % IJ SOLN
INTRAMUSCULAR | Status: AC
  Filled 2022-10-15: qty 5

## 2022-10-15 MED ORDER — SODIUM CHLORIDE 0.9 % IV SOLN
INTRAVENOUS | Status: DC | PRN
  Administered 2022-10-15: 80 mg via INTRAVENOUS

## 2022-10-15 MED ORDER — SODIUM CHLORIDE 0.9 % IV SOLN: INTRAVENOUS | Status: DC

## 2022-10-15 MED ORDER — OXYCODONE HCL 5 MG PO TABS: 5.0000 mg | ORAL_TABLET | Freq: Once | ORAL | Status: DC | PRN

## 2022-10-15 MED ORDER — METOCLOPRAMIDE HCL 5 MG/ML IJ SOLN: 5.0000 mg | Freq: Three times a day (TID) | INTRAMUSCULAR | Status: DC | PRN

## 2022-10-15 MED ORDER — PHENYLEPHRINE HCL (PRESSORS) 10 MG/ML IV SOLN
INTRAVENOUS | Status: DC | PRN
  Administered 2022-10-15 (×3): 80 ug via INTRAVENOUS

## 2022-10-15 MED ORDER — ONDANSETRON HCL 4 MG/2ML IJ SOLN: 4.0000 mg | Freq: Four times a day (QID) | INTRAMUSCULAR | Status: DC | PRN

## 2022-10-15 MED ORDER — METOCLOPRAMIDE HCL 5 MG PO TABS: 5.0000 mg | ORAL_TABLET | Freq: Three times a day (TID) | ORAL | Status: DC | PRN

## 2022-10-15 MED ORDER — DOCUSATE SODIUM 100 MG PO CAPS
100.0000 mg | ORAL_CAPSULE | Freq: Two times a day (BID) | ORAL | Status: DC
  Administered 2022-10-15 – 2022-10-16 (×2): 100 mg via ORAL
  Filled 2022-10-15 (×2): qty 1

## 2022-10-15 MED ORDER — FAMOTIDINE 20 MG PO TABS
20.0000 mg | ORAL_TABLET | Freq: Once | ORAL | Status: AC
  Administered 2022-10-15: 20 mg via ORAL

## 2022-10-15 MED ORDER — CHLORHEXIDINE GLUCONATE 0.12 % MT SOLN: 15.0000 mL | Freq: Once | OROMUCOSAL | Status: AC

## 2022-10-15 MED ORDER — DEXAMETHASONE SODIUM PHOSPHATE 10 MG/ML IJ SOLN
8.0000 mg | Freq: Once | INTRAMUSCULAR | Status: AC
  Administered 2022-10-15: 8 mg via INTRAVENOUS

## 2022-10-15 MED ORDER — FENTANYL CITRATE (PF) 100 MCG/2ML IJ SOLN: 25.0000 ug | INTRAMUSCULAR | Status: DC | PRN

## 2022-10-15 MED ORDER — LISINOPRIL 20 MG PO TABS
20.0000 mg | ORAL_TABLET | Freq: Every day | ORAL | Status: DC
  Administered 2022-10-16: 20 mg via ORAL
  Filled 2022-10-15 (×2): qty 1

## 2022-10-15 MED ORDER — OXYCODONE HCL 5 MG/5ML PO SOLN: 5.0000 mg | Freq: Once | ORAL | Status: DC | PRN

## 2022-10-15 MED ORDER — HYDROMORPHONE HCL 1 MG/ML IJ SOLN: 0.2500 mg | INTRAMUSCULAR | Status: DC | PRN

## 2022-10-15 MED ORDER — SODIUM CHLORIDE (PF) 0.9 % IJ SOLN
INTRAMUSCULAR | Status: DC | PRN
  Administered 2022-10-15: 71 mL via INTRAMUSCULAR

## 2022-10-15 MED ORDER — BUPIVACAINE HCL (PF) 0.5 % IJ SOLN
INTRAMUSCULAR | Status: DC | PRN
  Administered 2022-10-15: 2.8 mL via INTRATHECAL

## 2022-10-15 MED ORDER — GENTAMICIN SULFATE 40 MG/ML IJ SOLN
INTRAMUSCULAR | Status: AC
  Filled 2022-10-15: qty 2

## 2022-10-15 MED ORDER — ENOXAPARIN SODIUM 30 MG/0.3ML IJ SOSY
30.0000 mg | PREFILLED_SYRINGE | Freq: Two times a day (BID) | INTRAMUSCULAR | Status: DC
  Administered 2022-10-16: 30 mg via SUBCUTANEOUS
  Filled 2022-10-15: qty 0.3

## 2022-10-15 MED ORDER — PHENYLEPHRINE 80 MCG/ML (10ML) SYRINGE FOR IV PUSH (FOR BLOOD PRESSURE SUPPORT)
PREFILLED_SYRINGE | INTRAVENOUS | Status: AC
  Filled 2022-10-15: qty 10

## 2022-10-15 MED ORDER — LACTATED RINGERS IV SOLN: INTRAVENOUS | Status: DC

## 2022-10-15 MED ORDER — KETOROLAC TROMETHAMINE 15 MG/ML IJ SOLN
INTRAMUSCULAR | Status: AC
  Filled 2022-10-15: qty 1

## 2022-10-15 MED ORDER — PHENOL 1.4 % MT LIQD: 1.0000 | OROMUCOSAL | Status: DC | PRN

## 2022-10-15 MED ORDER — DULOXETINE HCL 30 MG PO CPEP
60.0000 mg | ORAL_CAPSULE | Freq: Every day | ORAL | Status: DC
  Administered 2022-10-15 – 2022-10-16 (×2): 60 mg via ORAL
  Filled 2022-10-15 (×2): qty 2

## 2022-10-15 MED ORDER — EPHEDRINE SULFATE (PRESSORS) 50 MG/ML IJ SOLN
INTRAMUSCULAR | Status: DC | PRN
  Administered 2022-10-15: 5 mg via INTRAVENOUS

## 2022-10-15 MED ORDER — SODIUM CHLORIDE 0.9 % IR SOLN
Status: DC | PRN
  Administered 2022-10-15: 3000 mL

## 2022-10-15 SURGICAL SUPPLY — 81 items
ADH SKN CLS APL DERMABOND .7 (GAUZE/BANDAGES/DRESSINGS) ×1
APL PRP STRL LF DISP 70% ISPRP (MISCELLANEOUS) ×2
BLADE PATELLA REAM PILOT HOLE (MISCELLANEOUS) IMPLANT
BLADE SAW 90X13X1.19 OSCILLAT (BLADE) IMPLANT
BLADE SAW SAG 25X90X1.19 (BLADE) ×1 IMPLANT
BLADE SAW SAG 29X58X.64 (BLADE) ×1 IMPLANT
BNDG CMPR 5X62 HK CLSR LF (GAUZE/BANDAGES/DRESSINGS) ×1
BNDG ELASTIC 6INX 5YD STR LF (GAUZE/BANDAGES/DRESSINGS) ×1 IMPLANT
BOWL CEMENT MIX W/ADAPTER (MISCELLANEOUS) ×1 IMPLANT
BSPLAT TIB 5D E CMNT KN RT (Knees) ×2 IMPLANT
CEMENT BONE R 1X40 (Cement) ×2 IMPLANT
CHLORAPREP W/TINT 26 (MISCELLANEOUS) ×2 IMPLANT
COOLER POLAR GLACIER W/PUMP (MISCELLANEOUS) ×1 IMPLANT
CUFF TOURN SGL QUICK 24 (TOURNIQUET CUFF)
CUFF TOURN SGL QUICK 34 (TOURNIQUET CUFF)
CUFF TRNQT CYL 24X4X16.5-23 (TOURNIQUET CUFF) IMPLANT
CUFF TRNQT CYL 34X4.125X (TOURNIQUET CUFF) IMPLANT
DERMABOND ADVANCED .7 DNX12 (GAUZE/BANDAGES/DRESSINGS) ×1 IMPLANT
DRAPE 3/4 80X56 (DRAPES) ×1 IMPLANT
DRAPE INCISE IOBAN 66X60 STRL (DRAPES) ×1 IMPLANT
DRSG MEPILEX SACRM 8.7X9.8 (GAUZE/BANDAGES/DRESSINGS) ×1 IMPLANT
DRSG OPSITE POSTOP 4X10 (GAUZE/BANDAGES/DRESSINGS) IMPLANT
DRSG OPSITE POSTOP 4X8 (GAUZE/BANDAGES/DRESSINGS) IMPLANT
ELECT CAUTERY BLADE 6.4 (BLADE) ×1 IMPLANT
ELECT REM PT RETURN 9FT ADLT (ELECTROSURGICAL) ×1
ELECTRODE REM PT RTRN 9FT ADLT (ELECTROSURGICAL) ×1 IMPLANT
GLOVE BIO SURGEON STRL SZ8 (GLOVE) ×1 IMPLANT
GLOVE BIOGEL PI IND STRL 8 (GLOVE) ×1 IMPLANT
GLOVE PI ORTHO PRO STRL 7.5 (GLOVE) ×2 IMPLANT
GLOVE PI ORTHO PRO STRL SZ8 (GLOVE) ×2 IMPLANT
GOWN STRL REUS W/ TWL LRG LVL3 (GOWN DISPOSABLE) ×1 IMPLANT
GOWN STRL REUS W/ TWL XL LVL3 (GOWN DISPOSABLE) ×2 IMPLANT
GOWN STRL REUS W/TWL LRG LVL3 (GOWN DISPOSABLE) ×1
GOWN STRL REUS W/TWL XL LVL3 (GOWN DISPOSABLE) ×2
HANDLE YANKAUER SUCT OPEN TIP (MISCELLANEOUS) ×1 IMPLANT
HDLS TROCR DRIL PIN KNEE 75 (PIN) ×1
HOLDER FOLEY CATH W/STRAP (MISCELLANEOUS) ×1 IMPLANT
HOOD PEEL AWAY T7 (MISCELLANEOUS) ×3 IMPLANT
INSERT TIB ASF PERSONA 14 (Orthopedic Implant) IMPLANT
IV NS IRRIG 3000ML ARTHROMATIC (IV SOLUTION) ×1 IMPLANT
JET LAVAGE IRRISEPT WOUND (IRRIGATION / IRRIGATOR) ×1
KIT TURNOVER KIT A (KITS) ×1 IMPLANT
LAVAGE JET IRRISEPT WOUND (IRRIGATION / IRRIGATOR) IMPLANT
MANIFOLD NEPTUNE II (INSTRUMENTS) ×1 IMPLANT
MARKER SKIN DUAL TIP RULER LAB (MISCELLANEOUS) ×1 IMPLANT
MAT ABSORB  FLUID 56X50 GRAY (MISCELLANEOUS) ×1
MAT ABSORB FLUID 56X50 GRAY (MISCELLANEOUS) ×1 IMPLANT
NDL HYPO 21X1.5 SAFETY (NEEDLE) ×1 IMPLANT
NDL SAFETY ECLIP 18X1.5 (MISCELLANEOUS) ×1 IMPLANT
NEEDLE HYPO 21X1.5 SAFETY (NEEDLE) ×1 IMPLANT
NS IRRIG 500ML POUR BTL (IV SOLUTION) ×1 IMPLANT
PACK TOTAL KNEE (MISCELLANEOUS) ×1 IMPLANT
PAD WRAPON POLAR KNEE (MISCELLANEOUS) ×1 IMPLANT
PENCIL SMOKE EVACUATOR (MISCELLANEOUS) ×1 IMPLANT
PIN DRILL HDLS TROCAR 75 4PK (PIN) IMPLANT
PULSAVAC PLUS IRRIG FAN TIP (DISPOSABLE) ×1
SCREW FEMALE HEX FIX 25X2.5 (ORTHOPEDIC DISPOSABLE SUPPLIES) IMPLANT
SCREW HEX HEADED 3.5X27 DISP (ORTHOPEDIC DISPOSABLE SUPPLIES) IMPLANT
SLEEVE SCD COMPRESS KNEE MED (STOCKING) ×1 IMPLANT
SOLUTION IRRIG SURGIPHOR (IV SOLUTION) ×1 IMPLANT
STEM POLY PAT PLY 32M KNEE (Knees) IMPLANT
STEM TIB ST PERS 14+30 (Stem) IMPLANT
STEM TIBIA 5 DEG SZ E R KNEE (Knees) IMPLANT
SUT DVC 2 QUILL PDO  T11 36X36 (SUTURE) ×1
SUT DVC 2 QUILL PDO T11 36X36 (SUTURE) ×1 IMPLANT
SUT QUILL MONODERM 3-0 PS-2 (SUTURE) ×1 IMPLANT
SUT VIC AB 0 CT1 36 (SUTURE) ×1 IMPLANT
SUT VIC AB 2-0 CT2 27 (SUTURE) ×2 IMPLANT
SUT VICRYL 1-0 27IN ABS (SUTURE) ×1
SUTURE VICRYL 1-0 27IN ABS (SUTURE) ×1 IMPLANT
SYR 10ML LL (SYRINGE) ×1 IMPLANT
SYR 30ML LL (SYRINGE) ×2 IMPLANT
SYR 3ML LL SCALE MARK (SYRINGE) ×1 IMPLANT
TAPE CLOTH 3X10 WHT NS LF (GAUZE/BANDAGES/DRESSINGS) ×1 IMPLANT
TIBIA STEM 5 DEG SZ E R KNEE (Knees) ×2 IMPLANT
TIP FAN IRRIG PULSAVAC PLUS (DISPOSABLE) ×1 IMPLANT
TOWEL OR 17X26 4PK STRL BLUE (TOWEL DISPOSABLE) IMPLANT
TRAP FLUID SMOKE EVACUATOR (MISCELLANEOUS) ×1 IMPLANT
TRAY FOLEY SLVR 16FR LF STAT (SET/KITS/TRAYS/PACK) ×1 IMPLANT
WATER STERILE IRR 1000ML POUR (IV SOLUTION) ×1 IMPLANT
WRAPON POLAR PAD KNEE (MISCELLANEOUS) ×1

## 2022-10-15 NOTE — Interval H&P Note (Signed)
Patient history and physical updated. Consent reviewed including risks, benefits, and alternatives to surgery. Patient agrees with above plan to proceed with right total knee arthroplasty  
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
## 2022-10-15 NOTE — H&P (Signed)
Chief Complaint  Patient presents with  Pre-op Exam  H&P - Right TKA - 10/15/22 scheduled with Dr. Leo Rod Deborah Jimenez is a 67 y.o. female who presents today for history and physical for right total knee arthroplasty with Dr. Karel Jarvis on 10/15/2022. She has severe degenerative changes throughout right knee medial lateral and patellofemoral compartments. She has complete loss of joint space in all 3 compartments of the right knee. She has had greater than 4 years of pain that was initially responding well to conservative treatment consisting of cortisone and gel injections but most recently she is no longer receiving any relief. She has severe debilitating pain in the right knee medial compartment that interferes with quality of life and activities daily living. She has been taking NSAIDs with little relief. She is also taking oxycodone 2.5 mg daily as needed for severe breakthrough pain with mild relief. She is ambulating with a walker. Her right knee will buckle and give way on her and interfere with her activities day living  Past Medical History: Past Medical History:  Diagnosis Date  Acute renal failure (ARF) (CMS-HCC) 2014  following hip surgery  Anemia 05/06/2018  Anxiety  B12 deficiency 08/11/2017  Carpal tunnel syndrome on both sides  Closed right fibular fracture 1991  Depression 08/12/2017  GERD (gastroesophageal reflux disease) 09/09/2017  Last Assessment & Plan: Chronic stable Continue pantoprazole 40 MG daily  Gout 02/03/2018  Patient previously on allopurinol 300 mg; off since bypass surgery  History of bariatric surgery 09/09/2017  History of polymyalgia rheumatica 08/11/2017  Hypertension 08/11/2017  Last Assessment & Plan: Chronic. Stable Will continue lisinopril 20 MG daily. Will check renal panel in 3 mos.  Hypothyroidism 08/11/2017  Last Assessment & Plan: Subjectively stable. Continue levothyroxine 225 mcgm (200 + 25) Will recheck TSH in 3 mos.  Insomnia  08/12/2017  Osteoarthritis 08/11/2017  Last Assessment & Plan: Chronic stable Continue Mobic 15 mg prn.  Osteoporosis 2022  Dx through [facility] & currently on Tymlos.  Right fibular fracture 1966  Vitamin D deficiency 08/11/2017  Wrist fracture, right 1965   Past Surgical History: Past Surgical History:  Procedure Laterality Date  TONSILLECTOMY Bilateral 1960  CESAREAN SECTION 1986  ARTHROSCOPY KNEE Left 1996  JOINT REPLACEMENT Left 2010  Hip  JOINT REPLACEMENT Right 2012  Hip  JOINT REPLACEMENT Left 2014  Hip  ROUX-EN-Y PROCEDURE 2017  CHOLECYSTECTOMY 2017  ARTHROSCOPY SHOULDER Right 07/18/2018  RIGHT MINI-OPEN ROTATOR CUFF REPAIR (SUPRASPINATUS) RIGHT ARTHROSCOPIC ROTATOR CUFF REAIR (SUBSCAPULARIS) RIGHT ARTHROSCOPIC DISTAL CLAVICLE EXCISION, RIGHT ARTHROSCOPIC EXTENSIVE DEBRIDMENT OF SHOULDER (GLENOHUMERAL AND SUBACROMIAL SPACES) RIGHT ARTHRSCOPIC SUBACROMIAL DECOMPRESSION  COLONOSCOPY 2020  Done in ~2020, pt reports was nml.  right reverse total shoulder arthroplasty. R biceos tenodesis Right 08/28/2019  Dr. Posey Pronto  ENDOSCOPIC CARPAL TUNNEL RELEASE Right 10/18/2019  Dr. Rudene Christians  ENDOSCOPIC CARPAL TUNNEL RELEASE Left 11/20/2019  L2-3 and L4-5 posterior spinal decompression 08/11/2021  Me[facility] Maw, [facility]   Past Family History: Family History  Problem Relation Age of Onset  Heart disease Mother  Aortic aneurysm Mother  High blood pressure (Hypertension) Mother  Heart disease Father  High blood pressure (Hypertension) Father  High blood pressure (Hypertension) Sister  Thyroid disease Sister  Heart disease Brother  Myocardial Infarction (Heart attack) Brother  No Known Problems Maternal Grandmother  mother adopted  No Known Problems Maternal Grandfather  mother adopted  Heart disease Paternal Grandmother  No Known Problems Paternal Grandfather   Medications: Current Outpatient Medications Ordered in Epic  Medication Sig  Dispense Refill  clonazePAM  (KLONOPIN) 0.5 MG tablet TAKE 0.5 TABLETS (0.25 MG TOTAL) BY MOUTH DAILY AS NEEDED FOR ANXIETY. PLEASE LIMIT USE  abaloparatide (TYMLOS) 80 mcg (3,120 mcg/1.56 mL) PnIj Inject 80 mg subcutaneously nightly (Patient not taking: Reported on 10/07/2022)  acetaminophen (TYLENOL) 500 MG tablet Take by mouth  buPROPion (WELLBUTRIN) 100 MG tablet Take 1 tablet by mouth once daily  busPIRone (BUSPAR) 10 MG tablet Take 10 mg by mouth 2 (two) times daily  calcium carbonate-vitamin D3 600 mg-20 mcg (800 unit) Tab Take by mouth  CALCIUM ORAL Take 600 mg by mouth once daily  celecoxib (CELEBREX) 100 MG capsule TAKE 1 CAPSULE BY MOUTH TWICE A DAY 180 capsule 1  cholecalciferol (VITAMIN D3) 1000 unit capsule Take 1,000 Units by mouth once daily  clindamycin (CLEOCIN) 300 MG capsule as needed. For dental procedures  DULoxetine (CYMBALTA) 60 MG DR capsule Take 1 capsule by mouth every morning  EPINEPHrine (EPIPEN) 0.3 mg/0.3 mL pen injector Inject 0.3 mLs into the muscle once daily as needed  ferrous sulfate (IRON ORAL) Take 130 mg by mouth once daily  ferrous sulfate 324 mg (65 mg iron) EC tablet Take 324 mg by mouth daily with breakfast  levothyroxine (SYNTHROID) 175 MCG tablet Take 1 tablet (175 mcg total) by mouth once daily Take on an empty stomach with a glass of water at least 30-60 minutes before breakfast. 90 tablet 1  lisinopriL (ZESTRIL) 20 MG tablet TAKE 1 TABLET BY MOUTH EVERY DAY 90 tablet 1  loratadine (CLARITIN) 10 mg tablet Take 10 mg by mouth once daily as needed for Allergies  multivitamin with minerals tablet Take 1 tablet by mouth 2 (two) times daily MVI gummi  pantoprazole (PROTONIX) 40 MG DR tablet TAKE 1 TABLET BY MOUTH EVERY DAY 90 tablet 1  zolpidem (AMBIEN) 10 mg tablet Take 5-10 mg by mouth nightly   No current Epic-ordered facility-administered medications on file.   Allergies: Allergies  Allergen Reactions  Bee Pollen Anaphylaxis  Penicillins Anaphylaxis  Adhesive Other (See  Comments)  Pulls off skin  Lanolin Hives and Itching  Latex Itching and Angioedema  Naproxen Diarrhea  Tramadol Anxiety and Other (See Comments)  Feeling of ants crawling on skin  Wool Hives and Itching  Tapentadol Other (See Comments)  Rips off top layer of skin. Paper tape is okay    Review of Systems:  A comprehensive 14 point ROS was performed, reviewed by me today, and the pertinent orthopaedic findings are documented in the HPI.  Exam: BP (!) 168/84  Ht 160 cm (5\' 3" )  Wt 73 kg (161 lb)  LMP (LMP Unknown)  BMI 28.52 kg/m  General:  Well developed, well nourished, no apparent distress, normal affect, antalgic gait with a walker  HEENT: Head normocephalic, atraumatic, PERRL.   Abdomen: Soft, non tender, non distended, Bowel sounds present.  Heart: Examination of the heart reveals regular, rate, and rhythm. There is no murmur noted on ascultation. There is a normal apical pulse.  Lungs: Lungs are clear to auscultation. There is no wheeze, rhonchi, or crackles. There is normal expansion of bilateral chest walls.   Comprehensive Knee Exam: Gait Antalgic and slow with a rolling walker  Alignment Neutral   Inspection Right Left  Skin Normal appearance with no obvious deformity. No ecchymosis or erythema. Normal appearance with no obvious deformity. No ecchymosis or erythema.  Soft Tissue No focal soft tissue swelling No focal soft tissue swelling  Quad Atrophy None None   Palpation  Right Left  Tenderness Medial joint line tenderness to palpation and parapatellar tenderness Medial joint line tenderness palpation and parapatellar tenderness  Crepitus + patellofemoral and tibiofemoral crepitus + patellofemoral and tibiofemoral crepitus  Effusion None None   Range of Motion              Right       Left  Flexion 15-95     10-105  Extension 10 degree flexion contracture not correctable 10 degree flexion contracture not correctable   Ligamentous Exam Right Left   Lachman Normal Normal  Valgus 0 Normal Normal  Valgus 30 Normal Normal  Varus 0 Normal Normal  Varus 30 Normal Normal  Anterior Drawer Normal Normal  Posterior Drawer Normal Normal   Meniscal Exam Right Left  Hyperflexion Test Positive Positive  Hyperextension Test Positive Positive  McMurray's Positive Positive   Neurovascular Right Left  Quadriceps Strength 5/5 5/5  Hamstring Strength 5/5 5/5  Hip Abductor Strength 4/5 4/5  Distal Motor Normal Normal  Distal Sensory Normal light touch sensation Normal light touch sensation  Distal Pulses Normal Normal    Imaging Studies: I have reviewed AP, lateral,sunrise, and flexed PA weight bearing knee X-rays (4 views) of the bilateral knees from 08/21/2022, these x-rays show severe degenerative changes with medial, lateral, and patellofemoral joint space narrowing with osteophyte formation, subchondral cysts, chondrocalcinosis, and sclerosis. There is lateral subluxation of both tibias under the femurs. Both knees are Kellgren-Lawrence grade 4.   Impression: Primary osteoarthritis of right knee [M17.11] Primary osteoarthritis of right knee (primary encounter diagnosis)  Plan:  41. 67 year old female with advanced right knee degenerative arthritis. She has complete loss of joint space in the medial lateral patellofemoral compartments with significant spurring. She has stiffness on exam. Her pain interferes with her quality of life and activities of daily living. Risks, benefits, complications of a right total knee arthroplasty have been discussed with the patient. Patient has agreed and consented to a right total knee arthroplasty with Dr. Karel Jarvis on 10/15/2022.  The hospitalization and post-operative care and rehabilitation were also discussed. The use of perioperative antibiotics and DVT prophylaxis were discussed. The risk, benefits and alternatives to a surgical intervention were discussed at length with the patient. The patient was also  advised of risks related to the medical comorbidities and elevated body mass index (BMI). A lengthy discussion took place to review the most common complications including but not limited to: stiffness, loss of function, complex regional pain syndrome, deep vein thrombosis, pulmonary embolus, heart attack, stroke, infection, wound breakdown, numbness, intraoperative fracture, damage to nerves, tendon,muscles, arteries or other blood vessels, death and other possible complications from anesthesia. The patient was told that we will take steps to minimize these risks by using sterile technique, antibiotics and DVT prophylaxis when appropriate and follow the patient postoperatively in the office setting to monitor progress. The possibility of recurrent pain, no improvement in pain and actual worsening of pain were also discussed with the patient.   This note was generated in part with voice recognition software and I apologize for any typographical errors that were not detected and corrected.

## 2022-10-15 NOTE — Evaluation (Signed)
Physical Therapy Evaluation Patient Details Name: Deborah Jimenez MRN: [id number] DOB: 26-Oct-1955 Today's Date: 10/15/2022  History of Present Illness  Pt is a 67 y.o. female s/p R TKA secondary to OA 10/15/22.  PMH includes renal failure, anemia, anxiety, CTS B, closed R fibular fx 1991, gout, h/o bariatric surgery 2019, h/o polymyalgia rheumatica 2019, htn, insomnia, L hip replacement 2010 and 2014, R reverse total shoulder arthroplasty 2021, R hip replacement 2012, and posterior spinal decompression.  Clinical Impression  Prior to surgery, pt was modified independent ambulating with rollator; lives with her husband in 1st floor apt.  R knee pain 5/10 at rest beginning of session and 6/10 at rest end of session (nurse notified of pt's request for pain meds).  Able to perform R LE SLR independently and R knee flexion AROM to 100 degrees.  Currently pt is SBA with bed mobility; CGA with transfers using walker; and CGA to ambulate a few feet bed to recliner with youth sized RW use.  Pt would currently benefit from skilled PT to address noted impairments and functional limitations (see below for any additional details).  Upon hospital discharge, pt would benefit from ongoing therapy.    Recommendations for follow up therapy are one component of a multi-disciplinary discharge planning process, led by the attending physician.  Recommendations may be updated based on patient status, additional functional criteria and insurance authorization.  Follow Up Recommendations Home health PT      Assistance Recommended at Discharge Intermittent Supervision/Assistance  Patient can return home with the following  A little help with walking and/or transfers;A little help with bathing/dressing/bathroom;Assistance with cooking/housework;Assist for transportation;Help with stairs or ramp for entrance    Equipment Recommendations Rolling walker (2 wheels) (youth sized)  Recommendations for Other Services        Functional Status Assessment Patient has had a recent decline in their functional status and demonstrates the ability to make significant improvements in function in a reasonable and predictable amount of time.     Precautions / Restrictions Precautions Precautions: Knee;Fall Precaution Booklet Issued: Yes (comment) Restrictions Weight Bearing Restrictions: Yes RLE Weight Bearing: Weight bearing as tolerated      Mobility  Bed Mobility Overal bed mobility: Needs Assistance Bed Mobility: Supine to Sit     Supine to sit: Supervision, HOB elevated     General bed mobility comments: no difficulties noted    Transfers Overall transfer level: Needs assistance Equipment used: Rolling walker (2 wheels) (youth sized) Transfers: Sit to/from Stand Sit to Stand: Min guard           General transfer comment: vc's for UE/LE placement and overall technique    Ambulation/Gait Ambulation/Gait assistance: Min guard Gait Distance (Feet): 3 Feet (bed to recliner) Assistive device: Rolling walker (2 wheels) (youth sized)   Gait velocity: decreased     General Gait Details: antalgic; decreased stance time R LE; steady with RW use  Stairs            Wheelchair Mobility    Modified Rankin (Stroke Patients Only)       Balance Overall balance assessment: Needs assistance Sitting-balance support: No upper extremity supported, Feet supported Sitting balance-Leahy Scale: Good Sitting balance - Comments: steady sitting reaching within BOS   Standing balance support: Bilateral upper extremity supported, During functional activity, Reliant on assistive device for balance Standing balance-Leahy Scale: Good Standing balance comment: steady taking steps with RW use  Pertinent Vitals/Pain Pain Assessment Pain Assessment: 0-10 Pain Score: 6  Pain Location: R knee Pain Descriptors / Indicators: Aching, Tender, Sore Pain Intervention(s):  Limited activity within patient's tolerance, Monitored during session, Repositioned, Patient requesting pain meds-RN notified, Other (comment) (polar care applied) Vitals (HR and O2 on room air) stable and WFL throughout treatment session.    Home Living Family/patient expects to be discharged to:: Private residence Living Arrangements: Spouse/significant other Available Help at Discharge: Family;Available PRN/intermittently Type of Home: Apartment (1st floor) Home Access: Stairs to enter Entrance Stairs-Rails: None Entrance Stairs-Number of Steps: 1 curb step to enter   Home Layout: One level Home Equipment: Rollator (4 wheels);Grab bars - tub/shower;Wheelchair - Brewing technologist;Toilet riser Additional Comments: Adjustable bed    Prior Function Prior Level of Function : Independent/Modified Independent             Mobility Comments: Modified independent ambulating with rollator; used manual w/c as needed in community.       Hand Dominance        Extremity/Trunk Assessment   Upper Extremity Assessment Upper Extremity Assessment: Overall WFL for tasks assessed    Lower Extremity Assessment Lower Extremity Assessment: RLE deficits/detail (L LE WFL) RLE Deficits / Details: able to perform R LE SLR independently; at least 3/5 AROM hip flexion and ankle DF/PF RLE: Unable to fully assess due to pain    Cervical / Trunk Assessment Cervical / Trunk Assessment: Normal  Communication   Communication: No difficulties  Cognition Arousal/Alertness: Awake/alert Behavior During Therapy: WFL for tasks assessed/performed Overall Cognitive Status: Within Functional Limits for tasks assessed                                          General Comments General comments (skin integrity, edema, etc.): R LE dressings in place.  Nursing cleared pt for participation in physical therapy.  Pt agreeable to PT session.    Exercises Total Joint Exercises Ankle  Circles/Pumps: AROM, Strengthening, Both, 10 reps, Supine Quad Sets: AROM, Strengthening, Both, 10 reps, Supine Heel Slides: AROM, Strengthening, Right, 10 reps, Supine Hip ABduction/ADduction: AROM, Strengthening, Right, 10 reps, Supine Straight Leg Raises: AROM, Strengthening, Right, 10 reps, Supine Goniometric ROM: R knee AROM 10 degrees short of neutral extension to 100 degrees flexion   Assessment/Plan    PT Assessment Patient needs continued PT services  PT Problem List Decreased strength;Decreased range of motion;Decreased activity tolerance;Decreased balance;Decreased mobility;Decreased knowledge of use of DME;Decreased knowledge of precautions;Decreased skin integrity;Pain       PT Treatment Interventions DME instruction;Gait training;Stair training;Functional mobility training;Therapeutic activities;Therapeutic exercise;Balance training;Patient/family education    PT Goals (Current goals can be found in the Care Plan section)  Acute Rehab PT Goals Patient Stated Goal: to improve pain and mobility PT Goal Formulation: With patient Time For Goal Achievement: 10/29/22 Potential to Achieve Goals: Good    Frequency BID     Co-evaluation               AM-PAC PT "6 Clicks" Mobility  Outcome Measure Help needed turning from your back to your side while in a flat bed without using bedrails?: None Help needed moving from lying on your back to sitting on the side of a flat bed without using bedrails?: A Little Help needed moving to and from a bed to a chair (including a wheelchair)?: A Little Help needed standing up from a chair  using your arms (e.g., wheelchair or bedside chair)?: A Little Help needed to walk in hospital room?: A Little Help needed climbing 3-5 steps with a railing? : A Little 6 Click Score: 19    End of Session Equipment Utilized During Treatment: Gait belt Activity Tolerance: Patient tolerated treatment well Patient left: in chair;with call  bell/phone within reach;with chair alarm set;with SCD's reapplied;Other (comment) (polar care in place; B heels floating via towel rolls) Nurse Communication: Mobility status;Precautions;Patient requests pain meds;Weight bearing status PT Visit Diagnosis: Other abnormalities of gait and mobility (R26.89);Muscle weakness (generalized) (M62.81);Pain Pain - Right/Left: Right Pain - part of body: Knee    Time: [id number] PT Time Calculation (min) (ACUTE ONLY): 30 min   Charges:   PT Evaluation $PT Eval Low Complexity: 1 Low PT Treatments $Therapeutic Exercise: 8-22 mins $Therapeutic Activity: 8-22 mins       Leitha Bleak, PT 10/15/22, 5:01 PM

## 2022-10-15 NOTE — TOC Progression Note (Signed)
Transition of Care [facility]) - Progression Note    Patient Details  Name: Deborah Jimenez MRN: [id number] Date of Birth: February 27, 1956  [facility] of [facility] CM/SW Contact  Cecil Cobbs Phone Number: 10/15/2022, 6:26 PM  Clinical Narrative:    Patient has home health set up through [facility].  Preplanned prior to hospital stay.        Expected Discharge Plan and Services                                               Social Determinants of Health (SDOH) Interventions SDOH Screenings   Food Insecurity: Food Insecurity Present (01/03/2018)  Transportation Needs: Unmet Transportation Needs (01/03/2018)  Depression (PHQ2-9): Medium Risk (08/27/2022)  Financial Resource Strain: Medium Risk (09/19/2018)  Physical Activity: Inactive (09/19/2018)  Social Connections: Moderately Integrated (01/03/2018)  Stress: Stress Concern Present (01/03/2018)  Tobacco Use: Medium Risk (10/15/2022)    Readmission Risk Interventions     No data to display

## 2022-10-15 NOTE — Transfer of Care (Signed)
Immediate Anesthesia Transfer of Care Note  Patient: Deborah Jimenez  Procedure(s) Performed: TOTAL KNEE ARTHROPLASTY (Right: Knee)  Patient Location: PACU  Anesthesia Type:Spinal  Level of Consciousness: awake, alert , and oriented  Airway & Oxygen Therapy: Patient Spontanous Breathing  Post-op Assessment: Report given to RN and Post -op Vital signs reviewed and stable  Post vital signs: Reviewed and stable  Last Vitals:  Vitals Value Taken Time  BP 110/64 1311  Temp 35.9 1311  Pulse 61 1311  Resp 17 1311  SpO2 97 1311    Last Pain:  Vitals:   10/15/22 0857  TempSrc: Temporal  PainSc: 0-No pain         Complications: No notable events documented.

## 2022-10-15 NOTE — Op Note (Signed)
Patient Name: Deborah Jimenez  [id number]  Pre-Operative Diagnosis: Right knee Osteoarthritis  Post-Operative Diagnosis: (same)  Procedure: Right Total Knee Arthroplasty  Components/Implants: Femur: Persona Size 7 PS   Tibia: Persona Size E w 14x78mm stem extension  Poly: 52mm CPS  Patella: 32x8.22mm Symmetric  Femoral Valgus Cut Angle: 5 degrees  Distal Femoral Re-cut: none  Patella Resurfacing: yes   Date of Surgery: 10/15/2022  Surgeon: Steffanie Rainwater MD  Assistant: Dorise Hiss PA (present and scrubbed throughout the case, critical for assistance with exposure, retraction, instrumentation, and closure)   Anesthesiologist: Danne Baxter  Anesthesia: Spinal  Tourniquet Time: 78 min  EBL: 50cc  IVF: [id number]  Complications: None   Brief history: The patient is a 67 year old female with a history of osteoarthritis of the right knee with pain limiting their range of motion and activities of daily living, which has failed multiple attempts at conservative therapy.  The risks and benefits of total knee arthroplasty as definitive surgical treatment were discussed with the patient, who opted to proceed with the operation.  After outpatient medical clearance and optimization was completed the patient was admitted to [facility] for the procedure.  All preoperative films were reviewed and an appropriate surgical plan was made prior to surgery. Preoperative range of motion was 15 to 95 with a 10 flexion contracture.  Description of procedure: The patient was brought to the operating room where laterality was confirmed by all those present to be the right side.   Spinal anesthesia was administered and the patient received an intravenous dose of antibiotics for surgical prophylaxis and a dose of tranexamic acid.  Patient is positioned supine on the operating room table with all bony prominences well-padded.  A well-padded tourniquet was applied to the right thigh.   The knee was then prepped and draped in usual sterile fashion with multiple layers of adhesive and nonadhesive drapes.  All of those present in the operating room participated in a surgical timeout laterality and patient were confirmed.   An Esmarch was wrapped around the extremity and the leg was elevated and the knee flexed.  The tourniquet was inflated to a pressure of 275 mmHg. The Esmarch was removed and the leg was brought down to full extension.  The patella and tibial tubercle identified and outlined using a marking pen and a midline skin incision was made with a knife carried through the subcutaneous tissue down to the extensor retinaculum.  After exposure of the extensor mechanism the medial parapatellar arthrotomy was performed with a scalpel and electrocautery extending down medial and distal to the tibial tubercle taking care to avoid incising the patellar tendon.   A standard medial release was performed over the proximal tibia.  The knee was brought into extension in order to excise the fat pad taking care not to damage the patella tendon.  The superior soft tissue was removed from the anterior surface of the distal femur to visualize for the procedure.  The knee was then brought into flexion with the patella subluxed laterally and subluxing the tibia anteriorly.  The ACL was transected and removed with electrocautery and additional soft tissue was removed from the proximal surface of the tibia to fully expose. Remaining PCL tissue was also carefully removed with electrocautery.  An extramedullary tibial cutting guide was then applied to the leg with a spring-loaded ankle clamp placed around the distal tibia just above the malleoli the angulation of the guide was adjusted to give some posterior slope in the tibial  resection with an appropriate varus/valgus alignment.  The resection guide was then pinned to the proximal tibia and the proximal tibial surface was resected with an oscillating saw.   Careful attention was paid to ensure the blade did not disrupt any of the soft tissues including any lateral or medial ligament.  Attention was then turned to the femur, with the knee slightly flexed a opening drill was used to enter the medullary canal of the femur.  After removing the drill marrow was suctioned out to decompress the distal femur.  An intramedullary femoral guide was then inserted into the drill hole and the alignment guide was seated firmly against the distal end of the medial femoral condyle.  The distal femoral cutting guide was then attached and pinned securely to the anterior surface of the femur and the intramedullary rod and alignment guide was removed.  Distal femur resection was then performed with an oscillating saw with retractors protecting medial and laterally.   The distal cutting block was then removed and the extension gap was checked with a spacer.  Extension gap was found to be appropriately sized to accommodate the spacer block.   The femoral sizing guide was then placed securely into the posterior condyles of the femur and the femoral size was measured and determined to be 7.  The size 7; 4-in-1 cutting guide was placed in position and secured with 2 pins.  The anterior posterior and chamfer resections were then performed with an oscillating saw.  Bony fragments and osteophytes were then removed.  Using a lamina spreader the posterior medial and lateral condyles were checked for additional osteophytes and posterior soft tissue remnants.  Any remaining meniscus was removed at this time.  Periarticular injection was performed in the meniscal rims and posterior capsule with aspiration performed to ensure no intravascular injection.   The tibia was then exposed and the tibial trial was pinned onto the plateau after confirming appropriate orientation and rotation.  Using the drill bushing the tibia was prepared to the appropriate drill depth.  Tibial broach impactor was then  driven through the punch guide using a mallet.  The femoral trial component was then inserted onto the femur. The femoral box was then cut with an oscillating saw.  A trial tibial polyethylene bearing was then placed and the knee was reduced.  The knee achieved full extension with no hyperextension and was found to be balanced in flexion and extension with the trials in place.  The knee was then brought into full extension the patella was everted and held with 2 Kocher clamps.  The articular surface of the patella was then resected with an patella reamer and saw after careful measurement with a caliper.  The patella was then prepared with the drill guide and a trial patella was placed.  The knee was then taken through range of motion and it was found that the patella articulated appropriately with the trochlea and good patellofemoral motion without subluxation.    The correct final components for implantation were confirmed and opened by the circulator nurse.  The prepared surfaces of the patella femur and tibia were cleaned with pulsatile lavage to remove all blood fat and other material and then the surfaces were dried.  2 bags of cement were mixed under vacuum and the components were cemented into place.  Excess cement was removed with curettes and forceps. A trial polyethylene tibial component was placed and the knee was brought into extension to allow the cement to set.  At this time the periarticular injection cocktail was placed in the soft tissues surrounding the knee.  After full curing of the cement the balance of the knee was checked again and the final polyethylene size was confirmed. The tibial component was irrigated and locking mechanism checked to ensure it was clear of debris. The real polyethylene tibial component was implanted and the knee was brought through a range of motion.   The knee was then irrigated with copious amount of normal saline via pulsatile lavage to remove all loose bodies and  other debris.  The knee was then irrigated with irrisept wash and reirrigated with saline.  The tourniquet was then dropped and all bleeding vessels were identified and coagulated.  The arthrotomy was approximated with #1 Vicryl and closed with #2 Quill suture.  The knee was brought into slight flexion and the subcutaneous tissues were closed with 0 Vicryl, 2-0 Vicryl and a running subcuticular 3-0 monoderm quil suture.  Skin was then glued with Dermabond.  A sterile adhesive dressing was then placed along with a sequential compression device to the calf, a Ted stocking, and a cryotherapy cuff.   Sponge, needle, and Lap counts were all correct at the end of the case.   The patient was transferred off of the operating room table to a hospital bed, good pulses were found distally on the operative side.  The patient was transferred to the recovery room in stable condition.

## 2022-10-16 DIAGNOSIS — N189 Chronic kidney disease, unspecified: Secondary | ICD-10-CM | POA: Diagnosis not present

## 2022-10-16 DIAGNOSIS — Z96652 Presence of left artificial knee joint: Secondary | ICD-10-CM | POA: Diagnosis not present

## 2022-10-16 DIAGNOSIS — Z96611 Presence of right artificial shoulder joint: Secondary | ICD-10-CM | POA: Diagnosis not present

## 2022-10-16 DIAGNOSIS — E039 Hypothyroidism, unspecified: Secondary | ICD-10-CM | POA: Diagnosis not present

## 2022-10-16 DIAGNOSIS — Z85819 Personal history of malignant neoplasm of unspecified site of lip, oral cavity, and pharynx: Secondary | ICD-10-CM | POA: Diagnosis not present

## 2022-10-16 DIAGNOSIS — Z79899 Other long term (current) drug therapy: Secondary | ICD-10-CM | POA: Diagnosis not present

## 2022-10-16 DIAGNOSIS — Z96643 Presence of artificial hip joint, bilateral: Secondary | ICD-10-CM | POA: Diagnosis not present

## 2022-10-16 DIAGNOSIS — I129 Hypertensive chronic kidney disease with stage 1 through stage 4 chronic kidney disease, or unspecified chronic kidney disease: Secondary | ICD-10-CM | POA: Diagnosis not present

## 2022-10-16 DIAGNOSIS — M1711 Unilateral primary osteoarthritis, right knee: Secondary | ICD-10-CM | POA: Diagnosis not present

## 2022-10-16 LAB — CBC
HCT: 27.4 % — ABNORMAL LOW (ref 36.0–46.0)
Hemoglobin: 9 g/dL — ABNORMAL LOW (ref 12.0–15.0)
MCH: 32.5 pg (ref 26.0–34.0)
MCHC: 32.8 g/dL (ref 30.0–36.0)
MCV: 98.9 fL (ref 80.0–100.0)
Platelets: 177 10*3/uL (ref 150–400)
RBC: 2.77 MIL/uL — ABNORMAL LOW (ref 3.87–5.11)
RDW: 13.1 % (ref 11.5–15.5)
WBC: 10.2 10*3/uL (ref 4.0–10.5)
nRBC: 0 % (ref 0.0–0.2)

## 2022-10-16 LAB — BASIC METABOLIC PANEL
Anion gap: 11 (ref 5–15)
BUN: 37 mg/dL — ABNORMAL HIGH (ref 8–23)
CO2: 20 mmol/L — ABNORMAL LOW (ref 22–32)
Calcium: 8.6 mg/dL — ABNORMAL LOW (ref 8.9–10.3)
Chloride: 109 mmol/L (ref 98–111)
Creatinine, Ser: 1.09 mg/dL — ABNORMAL HIGH (ref 0.44–1.00)
GFR, Estimated: 55 mL/min — ABNORMAL LOW (ref >60)
Glucose, Bld: 83 mg/dL (ref 70–99)
Potassium: 3.8 mmol/L (ref 3.5–5.1)
Sodium: 140 mmol/L (ref 135–145)

## 2022-10-16 MED ORDER — OXYCODONE HCL 5 MG PO TABS: 2.5000 mg | ORAL_TABLET | ORAL | 0 refills | Status: DC | PRN

## 2022-10-16 MED ORDER — ENOXAPARIN SODIUM 40 MG/0.4ML IJ SOSY: 40.0000 mg | PREFILLED_SYRINGE | INTRAMUSCULAR | 0 refills | Status: AC

## 2022-10-16 MED ORDER — ONDANSETRON HCL 4 MG PO TABS: 4.0000 mg | ORAL_TABLET | Freq: Four times a day (QID) | ORAL | 0 refills | Status: AC | PRN

## 2022-10-16 MED ORDER — ZOLPIDEM TARTRATE 5 MG PO TABS
5.0000 mg | ORAL_TABLET | Freq: Every evening | ORAL | Status: DC | PRN
  Administered 2022-10-16: 5 mg via ORAL
  Filled 2022-10-16: qty 1

## 2022-10-16 MED ORDER — DOCUSATE SODIUM 100 MG PO CAPS: 100.0000 mg | ORAL_CAPSULE | Freq: Two times a day (BID) | ORAL | 0 refills | Status: AC

## 2022-10-16 NOTE — Plan of Care (Signed)
  Problem: Education: Goal: Knowledge of the prescribed therapeutic regimen will improve Outcome: Progressing   Problem: Pain Managment: Goal: General experience of comfort will improve Outcome: Progressing   Problem: Safety: Goal: Ability to remain free from injury will improve Outcome: Progressing   Problem: Elimination: Goal: Will not experience complications related to bowel motility Outcome: Progressing   Problem: Skin Integrity: Goal: Risk for impaired skin integrity will decrease Outcome: Progressing

## 2022-10-16 NOTE — Plan of Care (Signed)
Problem: Education: Goal: Knowledge of the prescribed therapeutic regimen will improve 10/16/2022 1247 by Jenifer Struve Bet, LPN Outcome: Adequate for Discharge 10/16/2022 1002 by Octavian Godek Bet, LPN Outcome: Progressing Goal: Individualized Educational Video(s) 10/16/2022 1247 by Silviano Neuser Bet, LPN Outcome: Adequate for Discharge 10/16/2022 1002 by Kyen Taite Bet, LPN Outcome: Progressing   Problem: Activity: Goal: Ability to avoid complications of mobility impairment will improve 10/16/2022 1247 by Nela Bascom Bet, LPN Outcome: Adequate for Discharge 10/16/2022 1002 by Agripina Guyette Bet, LPN Outcome: Progressing Goal: Range of joint motion will improve 10/16/2022 1247 by Renu Asby Bet, LPN Outcome: Adequate for Discharge 10/16/2022 1002 by Nesanel Aguila Bet, LPN Outcome: Progressing   Problem: Clinical Measurements: Goal: Postoperative complications will be avoided or minimized 10/16/2022 1247 by Bocephus Cali Bet, LPN Outcome: Adequate for Discharge 10/16/2022 1002 by Teryn Boerema Bet, LPN Outcome: Progressing   Problem: Pain Management: Goal: Pain level will decrease with appropriate interventions 10/16/2022 1247 by Verdell Dykman Bet, LPN Outcome: Adequate for Discharge 10/16/2022 1002 by Bryona Foxworthy Bet, LPN Outcome: Progressing   Problem: Skin Integrity: Goal: Will show signs of wound healing 10/16/2022 1247 by Jeyden Coffelt Bet, LPN Outcome: Adequate for Discharge 10/16/2022 1002 by Ross Bender Bet, LPN Outcome: Progressing   Problem: Education: Goal: Knowledge of General Education information will improve Description: Including pain rating scale, medication(s)/side effects and non-pharmacologic comfort measures 10/16/2022 1247 by Edeline Greening Bet, LPN Outcome: Adequate for Discharge 10/16/2022 1002 by Makaelah Cranfield Bet, LPN Outcome: Progressing   Problem: Health Behavior/Discharge Planning: Goal: Ability to manage health-related needs will improve 10/16/2022 1247 by Oluwaseun Cremer Bet,  LPN Outcome: Adequate for Discharge 10/16/2022 1002 by Michiah Mudry Bet, LPN Outcome: Progressing   Problem: Clinical Measurements: Goal: Ability to maintain clinical measurements within normal limits will improve 10/16/2022 1247 by Minette Manders Bet, LPN Outcome: Adequate for Discharge 10/16/2022 1002 by Markia Kyer Bet, LPN Outcome: Progressing Goal: Will remain free from infection 10/16/2022 1247 by Hooria Gasparini Bet, LPN Outcome: Adequate for Discharge 10/16/2022 1002 by Deandria Klute Bet, LPN Outcome: Progressing Goal: Diagnostic test results will improve 10/16/2022 1247 by Aubriel Khanna Bet, LPN Outcome: Adequate for Discharge 10/16/2022 1002 by Casaundra Takacs Bet, LPN Outcome: Progressing Goal: Respiratory complications will improve 10/16/2022 1247 by Maddeline Roorda Bet, LPN Outcome: Adequate for Discharge 10/16/2022 1002 by Reily Ilic Bet, LPN Outcome: Progressing Goal: Cardiovascular complication will be avoided 10/16/2022 1247 by Cresencia Asmus Bet, LPN Outcome: Adequate for Discharge 10/16/2022 1002 by Zebedee Segundo Bet, LPN Outcome: Progressing   Problem: Activity: Goal: Risk for activity intolerance will decrease 10/16/2022 1247 by Kyal Arts Bet, LPN Outcome: Adequate for Discharge 10/16/2022 1002 by Vesta Wheeland Bet, LPN Outcome: Progressing   Problem: Nutrition: Goal: Adequate nutrition will be maintained 10/16/2022 1247 by Manon Banbury Bet, LPN Outcome: Adequate for Discharge 10/16/2022 1002 by Auden Tatar Bet, LPN Outcome: Progressing   Problem: Coping: Goal: Level of anxiety will decrease 10/16/2022 1247 by Joshva Labreck Bet, LPN Outcome: Adequate for Discharge 10/16/2022 1002 by Kendale Rembold Bet, LPN Outcome: Progressing   Problem: Elimination: Goal: Will not experience complications related to bowel motility 10/16/2022 1247 by Sabre Leonetti Bet, LPN Outcome: Adequate for Discharge 10/16/2022 1002 by Ellan Tess Bet, LPN Outcome: Progressing Goal: Will not experience complications related to urinary  retention 10/16/2022 1247 by Erynne Kealey Bet, LPN Outcome: Adequate for Discharge 10/16/2022 1002 by Havyn Ramo Bet, LPN Outcome: Progressing   Problem: Pain Managment: Goal: General experience of comfort will improve 10/16/2022 1247 by  Garnell Phenix Bet, LPN Outcome: Adequate for Discharge 10/16/2022 1002 by Elah Avellino Bet, LPN Outcome: Progressing   Problem: Safety: Goal: Ability to remain free from injury will improve 10/16/2022 1247 by Jex Strausbaugh Bet, LPN Outcome: Adequate for Discharge 10/16/2022 1002 by Syrena Burges Bet, LPN Outcome: Progressing   Problem: Skin Integrity: Goal: Risk for impaired skin integrity will decrease 10/16/2022 1247 by Deazia Lampi Bet, LPN Outcome: Adequate for Discharge 10/16/2022 1002 by Jariel Drost Bet, LPN Outcome: Progressing   Problem: Acute Rehab PT Goals(only PT should resolve) Goal: Pt Will Go Supine/Side To Sit Outcome: Adequate for Discharge Goal: Patient Will Transfer Sit To/From Stand Outcome: Adequate for Discharge Goal: Pt Will Transfer Bed To Chair/Chair To Bed Outcome: Adequate for Discharge Goal: Pt Will Ambulate Outcome: Adequate for Discharge Goal: Pt Will Go Up/Down Stairs Outcome: Adequate for Discharge Goal: Pt/caregiver will Perform Home Exercise Program Outcome: Adequate for Discharge

## 2022-10-16 NOTE — Discharge Summary (Signed)
Physician Discharge Summary  Patient ID: Deborah Jimenez MRN: [id number] DOB/AGE: Apr 16, 1956 67 y.o.  Admit date: 10/15/2022 Discharge date: 10/16/2022  Admission Diagnoses:  S/P TKR (total knee replacement) using cement, right [Z96.651]   Discharge Diagnoses: Patient Active Problem List   Diagnosis Date Noted   S/P TKR (total knee replacement) using cement, right 10/15/2022   Bereavement 08/06/2021   MDD (major depressive disorder), recurrent, in partial remission ([place]) 05/06/2021   MDD (major depressive disorder), recurrent episode, mild ([place]) 02/26/2021   Primary insomnia 11/20/2020   Osteoporosis 07/27/2020   MDD (major depressive disorder), recurrent, in full remission ([place]) 12/07/2019   High risk medication use 11/28/2019   S/p reverse total shoulder arthroplasty 08/28/2019   Carpal tunnel syndrome, bilateral upper limbs 08/03/2019   Bilateral hand numbness 07/31/2019   MDD (major depressive disorder), recurrent episode, moderate ([place]) 01/10/2019   GAD (generalized anxiety disorder) 01/10/2019   Arthritis 12/06/2018   Anemia 05/06/2018   Hyperuricemia 02/03/2018   GERD (gastroesophageal reflux disease) 09/09/2017   History of bariatric surgery 09/09/2017   Insomnia due to mental condition 08/12/2017   Depression 08/12/2017   Gout 08/11/2017   Hypertension 08/11/2017   Hypothyroidism 08/11/2017   B12 deficiency 08/11/2017   Vitamin D deficiency 08/11/2017   History of polymyalgia rheumatica 08/11/2017   Osteoarthritis 08/11/2017   Cervical polyp 03/09/2017   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   Hyperlipidemia, unspecified 02/05/2015   Shortness of breath on exertion 02/05/2015   Dyspnea on exertion 02/05/2015   Acute renal failure ([place]) 12/27/2012   Hypoxia 10/13/2012   Polymyalgia rheumatica ([place]) 01/04/2012   History of cardiovascular disorder 01/04/2012   Auditory vertigo 09/10/2011   Status post hip replacement 09/18/2010   History of repair of hip joint  09/18/2010   Atrophy of thyroid (acquired) 01/16/2009   Chronic pain syndrome 04/19/2006   Left hip pain 04/01/2005   Extreme obesity 12/19/2003    Past Medical History:  Diagnosis Date   Anemia    vitamin b12 and iron deficiency   Anxiety    Arthritis    Cancer ([place])    lip   Chronic kidney disease 2012   kidneys stopped working after hip surgery, unsure why but okay now   Depression    Dyspnea    when over 300 lbs, prior to gastric bypass   GERD (gastroesophageal reflux disease)    Gout    Hypertension    Hypothyroidism    Insomnia    Neuromuscular disorder ([place])    BOTH LEGS   Pneumonia    Polymyalgia rheumatica syndrome ([place])    extensive steroid therapy in the initial stages of diagnosis   Thyroid disease    Weight loss      Transfusion: none   Consultants (if any):   Discharged Condition: Improved  Hospital Course: Deborah Jimenez is an 67 y.o. female who was admitted 10/15/2022 with a diagnosis of S/P TKR (total knee replacement) using cement, right and went to the operating room on 10/15/2022 and underwent the above named procedures.    Surgeries: Procedure(s): TOTAL KNEE ARTHROPLASTY on 10/15/2022 Patient tolerated the surgery well. Taken to PACU where she was stabilized and then transferred to the orthopedic floor.  Started on Lovenox 30 mg q 12 hrs. TEDs and SCDs applied bilaterally. Heels elevated on bed. No evidence of DVT. Negative [place]. Physical therapy started on day #1 for gait training and transfer. OT started day #1 for ADL and assisted devices.Patient's IV was d/c on day #1. Patient  was able to safely and independently complete all PT goals. PT recommending discharge to home.  On post op day #1 patient was stable and ready for discharge to home with HHPT.  Implants: Femur: Persona Size 7 PS   Tibia: Persona Size E w 14x20mm stem extension  Poly: 59mm CPS  Patella: 32x8.9mm Symmetric   She was given perioperative antibiotics:  Anti-infectives  (From admission, onward)    Start     Dose/Rate Route Frequency Ordered Stop   10/15/22 2330  vancomycin (VANCOCIN) IVPB 1000 mg/200 mL premix        1,000 mg 200 mL/hr over 60 Minutes Intravenous Every 12 hours 10/15/22 1500 10/16/22 0813   10/15/22 0745  vancomycin (VANCOCIN) IVPB 1000 mg/200 mL premix        1,000 mg 200 mL/hr over 60 Minutes Intravenous On call to O.R. 10/15/22 0739 10/15/22 1120     .  She was given sequential compression devices, early ambulation, and Lovenox TEDs for DVT prophylaxis.  She benefited maximally from the hospital stay and there were no complications.    Recent vital signs:  Vitals:   10/16/22 0416 10/16/22 0733  BP: (!) 113/58 138/62  Pulse: 71 68  Resp: 20 16  Temp: 98 F (36.7 C) 98.3 F (36.8 C)  SpO2: 98% 99%    Recent laboratory studies:  Lab Results  Component Value Date   HGB 9.0 (L) 10/16/2022   HGB 11.6 (L) 10/06/2022   HGB 12.1 08/01/2021   Lab Results  Component Value Date   WBC 10.2 10/16/2022   PLT 177 10/16/2022   Lab Results  Component Value Date   INR 0.9 08/01/2021   Lab Results  Component Value Date   NA 140 10/16/2022   K 3.8 10/16/2022   CL 109 10/16/2022   CO2 20 (L) 10/16/2022   BUN 37 (H) 10/16/2022   CREATININE 1.09 (H) 10/16/2022   GLUCOSE 83 10/16/2022    Discharge Medications:   Allergies as of 10/16/2022       Reactions   Bee Pollen Anaphylaxis   Swells wherever she is stung.  Needs an epipen   Penicillins Anaphylaxis   Has patient had a PCN reaction causing immediate rash, facial/tongue/throat swelling, SOB or lightheadedness with hypotension: Yes Has patient had a PCN reaction causing severe rash involving mucus membranes or skin necrosis: No Has patient had a PCN reaction that required hospitalization: No Has patient had a PCN reaction occurring within the last 10 years: No If all of the above answers are "NO", then may proceed with Cephalosporin use.   Latex Rash, Swelling    Allergic rash reaction to oral surgery equipment   Naproxen Diarrhea, Other (See Comments)   Wool Alcohol [lanolin] Hives, Itching   Adhesive [tape] Other (See Comments)   Rips off top layer of skin. Paper tape is okay   Tramadol Other (See Comments)   Feels like ants are crawling all over her        Medication List     TAKE these medications    acetaminophen 500 MG tablet Commonly known as: TYLENOL Take 1,000 mg by mouth every 6 (six) hours as needed for mild pain.   ALIVE WOMENS GUMMY PO Take 1 tablet by mouth 2 (two) times daily.   buPROPion 100 MG tablet Commonly known as: WELLBUTRIN TAKE 1 TABLET BY MOUTH ONCE DAILY IN THE MORNING   busPIRone 10 MG tablet Commonly known as: BUSPAR Take 2 tablets (20 mg total) by  mouth 2 (two) times daily.   Calcium Carb-Cholecalciferol 600-800 MG-UNIT Tabs Take 1 tablet by mouth daily. 600 mg - 12.5 mcg   celecoxib 100 MG capsule Commonly known as: CELEBREX Take 100 mg by mouth 2 (two) times daily.   cholecalciferol 25 MCG (1000 UT) tablet Generic drug: Cholecalciferol Take 1,000 Units by mouth daily.   clonazePAM 0.5 MG tablet Commonly known as: KlonoPIN Take 0.5 tablets (0.25 mg total) by mouth daily as needed for anxiety. Please limit use   docusate sodium 100 MG capsule Commonly known as: COLACE Take 1 capsule (100 mg total) by mouth 2 (two) times daily.   DULoxetine 60 MG capsule Commonly known as: CYMBALTA TAKE 1 CAPSULE BY MOUTH EVERY DAY   enoxaparin 40 MG/0.4ML injection Commonly known as: LOVENOX Inject 0.4 mLs (40 mg total) into the skin daily for 14 days.   EPINEPHrine 0.3 mg/0.3 mL Soaj injection Commonly known as: EPI-PEN Inject 0.3 mLs (0.3 mg total) into the muscle as needed. What changed: reasons to take this   famotidine 40 MG tablet Commonly known as: PEPCID   ferrous sulfate 324 MG Tbec Take 324 mg by mouth daily with breakfast.   Fluzone High-Dose Quadrivalent 0.7 ML Susy Generic drug:  Influenza Vac High-Dose Quad   levothyroxine 150 MCG tablet Commonly known as: SYNTHROID Take 175 mcg by mouth daily before breakfast.   lisinopril 20 MG tablet Commonly known as: ZESTRIL Take 1 tablet (20 mg total) by mouth daily.   loratadine 10 MG tablet Commonly known as: CLARITIN Take 10 mg by mouth daily as needed for allergies.   Magnesium 400 MG Tabs Take 400 mg by mouth daily.   ondansetron 4 MG tablet Commonly known as: ZOFRAN Take 1 tablet (4 mg total) by mouth every 6 (six) hours as needed for nausea.   oxyCODONE 5 MG immediate release tablet Commonly known as: Oxy IR/ROXICODONE Take 0.5-1 tablets (2.5-5 mg total) by mouth every 4 (four) hours as needed for moderate pain or severe pain (pain score 4-6).   pantoprazole 40 MG tablet Commonly known as: PROTONIX Take 1 tablet (40 mg total) by mouth daily. What changed: when to take this   Pfizer-BioNT COVID-19 Vac-TriS Susp injection Generic drug: COVID-19 mRNA Vac-TriS Therapist, music)   Shingrix injection Generic drug: Zoster Vaccine Adjuvanted   zolpidem 10 MG tablet Commonly known as: AMBIEN Take 0.5-1 tablets (5-10 mg total) by mouth at bedtime as needed for sleep.               Durable Medical Equipment  (From admission, onward)           Start     Ordered   10/15/22 1656  For home use only DME Walker rolling  Once       Comments: Youth sized  Question Answer Comment  Walker: With 5 Inch Wheels   Patient needs a walker to treat with the following condition Other abnormalities of gait and mobility      10/15/22 1656            Diagnostic Studies: DG HIP UNILAT WITH PELVIS 1V RIGHT  Result Date: 10/15/2022 CLINICAL DATA:  Status post surgery. EXAM: DG HIP (WITH OR WITHOUT PELVIS) 1V RIGHT COMPARISON:  July 31, 2022. FINDINGS: Status post right total hip arthroplasty. No fracture or dislocation is noted. IMPRESSION: No acute abnormality seen. Electronically Signed   By: Marijo Conception M.D.    On: 10/15/2022 13:38   DG Knee 1-2 Views Right  Result Date: 10/15/2022 CLINICAL DATA:  Status post right knee replacement. EXAM: RIGHT KNEE - 1-2 VIEW COMPARISON:  None Available. FINDINGS: The right femoral and tibial components are well situated. Expected postoperative changes are seen in the surrounding soft tissues. IMPRESSION: Status post right total knee arthroplasty. Electronically Signed   By: Marijo Conception M.D.   On: 10/15/2022 13:36    Disposition:        Signed: Dorise Hiss CHRISTOPHER 10/16/2022, 8:46 AM

## 2022-10-16 NOTE — Progress Notes (Signed)
Reviewed discharge instructions with pt. Pt verbalized understanding. Discharged with  a rollin walker, polar care, zero-bone foam, personal belongings, and bilateral ted hose on. Staff wheeled pt out. Pt transported to home via family vehicle.

## 2022-10-16 NOTE — TOC Transition Note (Signed)
Transition of Care [facility]) - CM/SW Discharge Note   Patient Details  Name: Deborah Jimenez MRN: [id number] Date of Birth: 1956/02/27  Transition of Care [facility]) CM/SW Contact:  Candie Chroman, LCSW Phone Number: 10/16/2022, 9:17 AM   Clinical Narrative:  Patient has orders to discharge home today. [facility] liaison is aware. PT recommending a youth-sized RW. Tried calling patient in the room but no answer. Ordered through [employer] and sent secure chat to RN to notify. No further concerns. CSW signing off.   Final next level of care: [place] Barriers to Discharge: Barriers Resolved   Patient Goals and CMS Choice      Discharge Placement                      Patient and family notified of of transfer: 10/16/22  Discharge Plan and Services Additional resources added to the After Visit Summary for                  DME Arranged: Gilford Rile youth DME Agency: [employer] Date DME Agency Contacted: 10/16/22   Representative spoke with at DME Agency: Rica Koyanagi HH Arranged: PT, OT [facility] Agency: [place] Date [place]: 10/16/22   Representative spoke with at [place]: Adela Lank  Social Determinants of Health (SDOH) Interventions SDOH Screenings   Food Insecurity: Food Insecurity Present (01/03/2018)  Transportation Needs: Unmet Transportation Needs (01/03/2018)  Depression (PHQ2-9): Medium Risk (08/27/2022)  Financial Resource Strain: Medium Risk (09/19/2018)  Physical Activity: Inactive (09/19/2018)  Social Connections: Moderately Integrated (01/03/2018)  Stress: Stress Concern Present (01/03/2018)  Tobacco Use: Medium Risk (10/15/2022)     Readmission Risk Interventions     No data to display

## 2022-10-16 NOTE — Progress Notes (Addendum)
Subjective: 1 Day Post-Op Procedure(s) (LRB): TOTAL KNEE ARTHROPLASTY (Right) Patient reports pain as mild.   Patient is well, and has had no acute complaints or problems Denies any CP, SOB, ABD pain. We will continue therapy today.  Plan is to go Home after hospital stay.  Objective: Vital signs in last 24 hours: Temp:  [97.2 F (36.2 C)-98.5 F (36.9 C)] 98.3 F (36.8 C) (03/22 0733) Pulse Rate:  [66-75] 68 (03/22 0733) Resp:  [14-23] 16 (03/22 0733) BP: (84-138)/(48-62) 138/62 (03/22 0733) SpO2:  [94 %-100 %] 99 % (03/22 0733) Weight:  [72.6 kg] 72.6 kg (03/21 0857)  Intake/Output from previous day: 03/21 0701 - 03/22 0700 In: 1000 [I.V.:600; IV Piggyback:400] Out: 825 [Urine:775; Blood:50] Intake/Output this shift: No intake/output data recorded.  Recent Labs    10/16/22 0633  HGB 9.0*   Recent Labs    10/16/22 0633  WBC 10.2  RBC 2.77*  HCT 27.4*  PLT 177   Recent Labs    10/16/22 0633  NA 140  K 3.8  CL 109  CO2 20*  BUN 37*  CREATININE 1.09*  GLUCOSE 83  CALCIUM 8.6*   No results for input(s): "LABPT", "INR" in the last 72 hours.  EXAM General - Patient is Alert, Appropriate, and Oriented Extremity - Neurovascular intact Sensation intact distally Intact pulses distally Dorsiflexion/Plantar flexion intact No cellulitis present Compartment soft Dressing - dressing C/D/I and no drainage Motor Function - intact, moving foot and toes well on exam.   Past Medical History:  Diagnosis Date   Anemia    vitamin b12 and iron deficiency   Anxiety    Arthritis    Cancer ([place])    lip   Chronic kidney disease 2012   kidneys stopped working after hip surgery, unsure why but okay now   Depression    Dyspnea    when over 300 lbs, prior to gastric bypass   GERD (gastroesophageal reflux disease)    Gout    Hypertension    Hypothyroidism    Insomnia    Neuromuscular disorder ([facility])    BOTH LEGS   Pneumonia    Polymyalgia rheumatica syndrome  ([facility])    extensive steroid therapy in the initial stages of diagnosis   Thyroid disease    Weight loss     Assessment/Plan:   1 Day Post-Op Procedure(s) (LRB): TOTAL KNEE ARTHROPLASTY (Right) Principal Problem:   S/P TKR (total knee replacement) using cement, right  Estimated body mass index is 29.26 kg/m as calculated from the following:   Height as of this encounter: 5\' 2"  (1.575 m).   Weight as of this encounter: 72.6 kg. Advance diet Up with therapy Vital signs are stable Labs are stable, slight drop in hemoglobin to 9.0.  Continue with iron supplement.  Patient asymptomatic. Care management to assist with discharge to home with home health PT today pending safe completion of all PT goals.  Patient is not able to walk the distance required to go the bathroom, or he/she is unable to safely negotiate stairs required to access the bathroom.  A 3in1 BSC will alleviate this problem.       DVT Prophylaxis - Lovenox, TED hose, and SCDs Weight-Bearing as tolerated to right leg   T. Rachelle Hora, PA-C [place] 10/16/2022, 8:25 AM  Patient seen and examined, agree with above plan.  The patient is doing well status post right total knee arthroplasty, no concerns at this time.  Pain is controlled.  Iron  for post op anemia. Discussed DVT prophylaxis, pain medication use, and safe transition to home.  All questions answered the patient agrees with above plan will go home after clears PT.   Steffanie Rainwater MD

## 2022-10-16 NOTE — Progress Notes (Signed)
Physical Therapy Treatment Patient Details Name: Deborah Jimenez MRN: [id number] DOB: May 13, 1956 Today's Date: 10/16/2022   History of Present Illness Pt is a 67 y.o. female s/p R TKA secondary to OA 10/15/22.  PMH includes renal failure, anemia, anxiety, CTS B, closed R fibular fx 1991, gout, h/o bariatric surgery 2019, h/o polymyalgia rheumatica 2019, htn, insomnia, L hip replacement 2010 and 2014, R reverse total shoulder arthroplasty 2021, R hip replacement 2012, and posterior spinal decompression.    PT Comments    Pt resting in bed upon PT arrival; agreeable to therapy despite pain (not due for pain meds yet); 8/10 R knee pain throughout session (pt received pain meds end of session).  During session pt modified independent supine to sitting edge of bed; CGA with transfers; SBA with ambulating 160 feet with youth sized RW use (altered gait mechanics noted--see below for details); and CGA navigating 4 steps with UE support.  Reviewed LE TKA HEP handout--pt demonstrating appropriate understanding.  Pt also educated on home safety, fall prevention, and safe car transfers: pt verbalizing appropriate understanding.  Pt appears safe to discharge home when medically appropriate (pt planning for HHPT)--MD, PA, TOC, and nurse notified; TOC ordered youth sized RW.   Recommendations for follow up therapy are one component of a multi-disciplinary discharge planning process, led by the attending physician.  Recommendations may be updated based on patient status, additional functional criteria and insurance authorization.  Follow Up Recommendations  Home health PT     Assistance Recommended at Discharge Intermittent Supervision/Assistance  Patient can return home with the following A little help with walking and/or transfers;A little help with bathing/dressing/bathroom;Assistance with cooking/housework;Assist for transportation;Help with stairs or ramp for entrance   Equipment Recommendations   Rolling walker (2 wheels) (youth sized)    Recommendations for Other Services       Precautions / Restrictions Precautions Precautions: Knee;Fall Precaution Booklet Issued: Yes (comment) Restrictions Weight Bearing Restrictions: Yes RLE Weight Bearing: Weight bearing as tolerated     Mobility  Bed Mobility Overal bed mobility: Needs Assistance Bed Mobility: Supine to Sit     Supine to sit: Modified independent (Device/Increase time)     General bed mobility comments: bed flat; no difficulties noted    Transfers Overall transfer level: Needs assistance Equipment used: Rolling walker (2 wheels) (youth sized) Transfers: Sit to/from Stand Sit to Stand: Min guard           General transfer comment: initial vc's for UE/LE placement; steady and safe    Ambulation/Gait Ambulation/Gait assistance: Supervision Gait Distance (Feet): 160 Feet Assistive device: Rolling walker (2 wheels) (youth sized)   Gait velocity: decreased     General Gait Details: antalgic; decreased stance time R LE; increased L weightshift/lean during L LE stance phase; R hip hike and hip rotation forward to advance R LE; steady with RW use   Stairs Stairs: Yes Stairs assistance: Min guard Stair Management: Step to pattern, Forwards Number of Stairs: 4 General stair comments: ascended/descended 3 steps with B railings and 1 step with RW; initial vc's for technique/LE sequencing and then pt able to perform safely without further cueing   Wheelchair Mobility    Modified Rankin (Stroke Patients Only)       Balance Overall balance assessment: Needs assistance Sitting-balance support: No upper extremity supported, Feet supported Sitting balance-Leahy Scale: Normal Sitting balance - Comments: steady sitting reaching outside BOS   Standing balance support: Bilateral upper extremity supported, During functional activity, Reliant on assistive device for balance  Standing balance-Leahy Scale:  Good Standing balance comment: steady ambulating with RW use                            Cognition Arousal/Alertness: Awake/alert Behavior During Therapy: WFL for tasks assessed/performed Overall Cognitive Status: Within Functional Limits for tasks assessed                                          Exercises Total Joint Exercises Ankle Circles/Pumps: AROM, Strengthening, Both, 10 reps, Supine Quad Sets: AROM, Strengthening, Both, 10 reps, Supine Short Arc Quad: AROM, Strengthening, Right, 10 reps, Supine Heel Slides: AROM, Strengthening, Right, 10 reps, Supine Hip ABduction/ADduction: AROM, Strengthening, Right, 10 reps, Supine Straight Leg Raises: AROM, Strengthening, Right, 10 reps, Supine Goniometric ROM: R knee AROM 8 degrees short of neutral extension to 110 degrees flexion    General Comments General comments (skin integrity, edema, etc.): R LE dressings in place.  Nursing cleared pt for participation in physical therapy.  Pt agreeable to PT session.      Pertinent Vitals/Pain Pain Assessment Pain Assessment: 0-10 Pain Score: 8  Pain Location: R knee Pain Descriptors / Indicators: Constant, Sharp Pain Intervention(s): Limited activity within patient's tolerance, Monitored during session, Repositioned, Patient requesting pain meds-RN notified, Other (comment) (RN gave pain meds end of session) Vitals (HR and O2 on room air) stable and WFL throughout treatment session.    Home Living                          Prior Function            PT Goals (current goals can now be found in the care plan section) Acute Rehab PT Goals Patient Stated Goal: to improve pain and mobility PT Goal Formulation: With patient Time For Goal Achievement: 10/29/22 Potential to Achieve Goals: Good Progress towards PT goals: Progressing toward goals    Frequency    BID      PT Plan Current plan remains appropriate    Co-evaluation               AM-PAC PT "6 Clicks" Mobility   Outcome Measure  Help needed turning from your back to your side while in a flat bed without using bedrails?: None Help needed moving from lying on your back to sitting on the side of a flat bed without using bedrails?: None Help needed moving to and from a bed to a chair (including a wheelchair)?: A Little Help needed standing up from a chair using your arms (e.g., wheelchair or bedside chair)?: A Little Help needed to walk in hospital room?: A Little Help needed climbing 3-5 steps with a railing? : A Little 6 Click Score: 20    End of Session Equipment Utilized During Treatment: Gait belt Activity Tolerance: Patient tolerated treatment well Patient left: in chair;with call bell/phone within reach;with chair alarm set;with SCD's reapplied;Other (comment) (polar care in place; B heels floating via towel rolls) Nurse Communication: Mobility status;Precautions;Patient requests pain meds;Weight bearing status PT Visit Diagnosis: Other abnormalities of gait and mobility (R26.89);Muscle weakness (generalized) (M62.81);Pain Pain - Right/Left: Right Pain - part of body: Knee     Time: 0917-0950 PT Time Calculation (min) (ACUTE ONLY): 33 min  Charges:  $Gait Training: 8-22 mins $Therapeutic Exercise: 8-22 mins  Leitha Bleak, PT 10/16/22, 10:19 AM

## 2022-10-16 NOTE — Anesthesia Postprocedure Evaluation (Signed)
Anesthesia Post Note  Patient: Deborah Jimenez  Procedure(s) Performed: TOTAL KNEE ARTHROPLASTY (Right: Knee)  Patient location during evaluation: Nursing Unit Anesthesia Type: Spinal Level of consciousness: oriented and awake and alert Pain management: pain level controlled Vital Signs Assessment: post-procedure vital signs reviewed and stable Respiratory status: spontaneous breathing and respiratory function stable Cardiovascular status: blood pressure returned to baseline and stable Postop Assessment: no headache, no backache, no apparent nausea or vomiting and patient able to bend at knees Anesthetic complications: no   No notable events documented.   Last Vitals:  Vitals:   10/16/22 0416 10/16/22 0733  BP: (!) 113/58 138/62  Pulse: 71 68  Resp: 20 16  Temp: 36.7 C 36.8 C  SpO2: 98% 99%    Last Pain:  Vitals:   10/16/22 0625  TempSrc:   PainSc: [place]

## 2022-10-16 NOTE — Discharge Instructions (Signed)
Instructions after Total Knee Replacement   Steffanie Rainwater M.D.     Dept. of [facility]  [address] [place], [place]  [address]  Phone: [phone]   Fax: [phone]    DIET: Drink plenty of non-alcoholic fluids. Resume your normal diet. Include foods high in fiber.  ACTIVITY:  You may use crutches or a walker with weight-bearing as tolerated, unless instructed otherwise. You may be weaned off of the walker or crutches by your Physical Therapist.  Do NOT place pillows under the knee. Anything placed under the knee could limit your ability to straighten the knee.   Continue doing gentle exercises. Exercising will reduce the pain and swelling, increase motion, and prevent muscle weakness.   Please continue to use the TED compression stockings for 2 weeks. You may remove the stockings at night, but should reapply them in the morning. Do not drive or operate any equipment until instructed.  WOUND CARE:  Continue to use the PolarCare or ice packs periodically to reduce pain and swelling. You may begin showering 3 days after surgery with honeycomb dressing. Remove honeycomb dressing 7 days after surgery and continue showering. Allow dermabond to fall off on its own.  MEDICATIONS: You may resume your regular medications. Please take the pain medication as prescribed on the medication. Do not take pain medication on an empty stomach. You have been given a prescription for a blood thinner (Lovenox or Coumadin). Please take the medication as instructed. (NOTE: After completing a 2 week course of Lovenox, take one 81 mg Enteric-coated aspirin twice a day for 3 additional weeks. This along with elevation will help reduce the possibility of phlebitis in your operated leg.) Do not drive or drink alcoholic beverages when taking pain medications.  POSTOPERATIVE CONSTIPATION PROTOCOL Constipation - defined medically as fewer than three stools per  week and severe constipation as less than one stool per week.  One of the most common issues patients have following surgery is constipation.  Even if you have a regular bowel pattern at home, your normal regimen is likely to be disrupted due to multiple reasons following surgery.  Combination of anesthesia, postoperative narcotics, change in appetite and fluid intake all can affect your bowels.  In order to avoid complications following surgery, here are some recommendations in order to help you during your recovery period.  Colace (docusate) - Pick up an over-the-counter form of Colace or another stool softener and take twice a day as long as you are requiring postoperative pain medications.  Take with a full glass of water daily.  If you experience loose stools or diarrhea, hold the colace until you stool forms back up.  If your symptoms do not get better within 1 week or if they get worse, check with your doctor.  Dulcolax (bisacodyl) - Pick up over-the-counter and take as directed by the product packaging as needed to assist with the movement of your bowels.  Take with a full glass of water.  Use this product as needed if not relieved by Colace only.   MiraLax (polyethylene glycol) - Pick up over-the-counter to have on hand.  MiraLax is a solution that will increase the amount of water in your bowels to assist with bowel movements.  Take as directed and can mix with a glass of water, juice, soda, coffee, or tea.  Take if you go more than two days without a movement. Do not use MiraLax more than once per day.  Call your doctor if you are still constipated or irregular after using this medication for 7 days in a row.  If you continue to have problems with postoperative constipation, please contact the office for further assistance and recommendations.  If you experience "the worst abdominal pain ever" or develop nausea or vomiting, please contact the office immediatly for further recommendations for  treatment.   CALL THE OFFICE FOR: Temperature above 101 degrees Excessive bleeding or drainage on the dressing. Excessive swelling, coldness, or paleness of the toes. Persistent nausea and vomiting.  FOLLOW-UP:  You should have an appointment to return to the office in 14 days after surgery. Arrangements have been made for continuation of Physical Therapy (either home therapy or outpatient therapy).

## 2022-10-16 NOTE — Progress Notes (Signed)
Pt refused mobility with this nurse at this time.

## 2022-10-17 ENCOUNTER — Encounter: Payer: Self-pay | Admitting: Orthopedic Surgery

## 2022-10-18 DIAGNOSIS — G5603 Carpal tunnel syndrome, bilateral upper limbs: Secondary | ICD-10-CM | POA: Diagnosis not present

## 2022-10-18 DIAGNOSIS — M109 Gout, unspecified: Secondary | ICD-10-CM | POA: Diagnosis not present

## 2022-10-18 DIAGNOSIS — Z96651 Presence of right artificial knee joint: Secondary | ICD-10-CM | POA: Diagnosis not present

## 2022-10-18 DIAGNOSIS — K219 Gastro-esophageal reflux disease without esophagitis: Secondary | ICD-10-CM | POA: Diagnosis not present

## 2022-10-18 DIAGNOSIS — N189 Chronic kidney disease, unspecified: Secondary | ICD-10-CM | POA: Diagnosis not present

## 2022-10-18 DIAGNOSIS — D631 Anemia in chronic kidney disease: Secondary | ICD-10-CM | POA: Diagnosis not present

## 2022-10-18 DIAGNOSIS — F3342 Major depressive disorder, recurrent, in full remission: Secondary | ICD-10-CM | POA: Diagnosis not present

## 2022-10-18 DIAGNOSIS — Z471 Aftercare following joint replacement surgery: Secondary | ICD-10-CM | POA: Diagnosis not present

## 2022-10-18 DIAGNOSIS — I129 Hypertensive chronic kidney disease with stage 1 through stage 4 chronic kidney disease, or unspecified chronic kidney disease: Secondary | ICD-10-CM | POA: Diagnosis not present

## 2022-10-19 ENCOUNTER — Encounter: Payer: Self-pay | Admitting: Orthopedic Surgery

## 2022-10-20 DIAGNOSIS — D631 Anemia in chronic kidney disease: Secondary | ICD-10-CM | POA: Diagnosis not present

## 2022-10-20 DIAGNOSIS — Z96651 Presence of right artificial knee joint: Secondary | ICD-10-CM | POA: Diagnosis not present

## 2022-10-20 DIAGNOSIS — K219 Gastro-esophageal reflux disease without esophagitis: Secondary | ICD-10-CM | POA: Diagnosis not present

## 2022-10-20 DIAGNOSIS — F3342 Major depressive disorder, recurrent, in full remission: Secondary | ICD-10-CM | POA: Diagnosis not present

## 2022-10-20 DIAGNOSIS — N189 Chronic kidney disease, unspecified: Secondary | ICD-10-CM | POA: Diagnosis not present

## 2022-10-20 DIAGNOSIS — M109 Gout, unspecified: Secondary | ICD-10-CM | POA: Diagnosis not present

## 2022-10-20 DIAGNOSIS — I129 Hypertensive chronic kidney disease with stage 1 through stage 4 chronic kidney disease, or unspecified chronic kidney disease: Secondary | ICD-10-CM | POA: Diagnosis not present

## 2022-10-20 DIAGNOSIS — G5603 Carpal tunnel syndrome, bilateral upper limbs: Secondary | ICD-10-CM | POA: Diagnosis not present

## 2022-10-20 DIAGNOSIS — Z471 Aftercare following joint replacement surgery: Secondary | ICD-10-CM | POA: Diagnosis not present

## 2022-10-27 ENCOUNTER — Telehealth: Payer: Self-pay

## 2022-10-27 DIAGNOSIS — F5101 Primary insomnia: Secondary | ICD-10-CM

## 2022-10-27 NOTE — Telephone Encounter (Signed)
  pt left a message that she needs a refill on the [facility]. pt was last seen on 08-27-22 and next appt on 11-26-22     Disp Refills Start End   zolpidem (AMBIEN) 10 MG tablet 90 tablet 0 08/03/2022    Sig - Route: Take 0.5-1 tablets (5-10 mg total) by mouth at bedtime as needed for sleep. - Oral   Sent to pharmacy as: zolpidem (AMBIEN) 10 MG tablet   Notes to Pharmacy: For next refill place on file please, DX CODE F51.01 OK TO REFILL EARLY PATIENT GOING OUT OF TOWN   E-Prescribing Status: Receipt confirmed by pharmacy (08/03/2022  5:06 PM EST)

## 2022-10-28 DIAGNOSIS — G5603 Carpal tunnel syndrome, bilateral upper limbs: Secondary | ICD-10-CM | POA: Diagnosis not present

## 2022-10-28 DIAGNOSIS — K219 Gastro-esophageal reflux disease without esophagitis: Secondary | ICD-10-CM | POA: Diagnosis not present

## 2022-10-28 DIAGNOSIS — Z96651 Presence of right artificial knee joint: Secondary | ICD-10-CM | POA: Diagnosis not present

## 2022-10-28 DIAGNOSIS — N189 Chronic kidney disease, unspecified: Secondary | ICD-10-CM | POA: Diagnosis not present

## 2022-10-28 DIAGNOSIS — D631 Anemia in chronic kidney disease: Secondary | ICD-10-CM | POA: Diagnosis not present

## 2022-10-28 DIAGNOSIS — M109 Gout, unspecified: Secondary | ICD-10-CM | POA: Diagnosis not present

## 2022-10-28 DIAGNOSIS — I129 Hypertensive chronic kidney disease with stage 1 through stage 4 chronic kidney disease, or unspecified chronic kidney disease: Secondary | ICD-10-CM | POA: Diagnosis not present

## 2022-10-28 DIAGNOSIS — Z471 Aftercare following joint replacement surgery: Secondary | ICD-10-CM | POA: Diagnosis not present

## 2022-10-28 DIAGNOSIS — F3342 Major depressive disorder, recurrent, in full remission: Secondary | ICD-10-CM | POA: Diagnosis not present

## 2022-10-28 MED ORDER — ZOLPIDEM TARTRATE 10 MG PO TABS: 5.0000 mg | ORAL_TABLET | Freq: Every evening | ORAL | 0 refills | Status: DC | PRN

## 2022-10-28 NOTE — Telephone Encounter (Signed)
I have sent zolpidem to pharmacy. 

## 2022-10-29 DIAGNOSIS — Z471 Aftercare following joint replacement surgery: Secondary | ICD-10-CM | POA: Diagnosis not present

## 2022-10-29 NOTE — Telephone Encounter (Signed)
left message that rx has been sent to the pharamcy

## 2022-10-30 DIAGNOSIS — Z471 Aftercare following joint replacement surgery: Secondary | ICD-10-CM | POA: Diagnosis not present

## 2022-10-30 DIAGNOSIS — N189 Chronic kidney disease, unspecified: Secondary | ICD-10-CM | POA: Diagnosis not present

## 2022-10-30 DIAGNOSIS — F3342 Major depressive disorder, recurrent, in full remission: Secondary | ICD-10-CM | POA: Diagnosis not present

## 2022-10-30 DIAGNOSIS — G5603 Carpal tunnel syndrome, bilateral upper limbs: Secondary | ICD-10-CM | POA: Diagnosis not present

## 2022-10-30 DIAGNOSIS — I129 Hypertensive chronic kidney disease with stage 1 through stage 4 chronic kidney disease, or unspecified chronic kidney disease: Secondary | ICD-10-CM | POA: Diagnosis not present

## 2022-10-30 DIAGNOSIS — D631 Anemia in chronic kidney disease: Secondary | ICD-10-CM | POA: Diagnosis not present

## 2022-10-30 DIAGNOSIS — K219 Gastro-esophageal reflux disease without esophagitis: Secondary | ICD-10-CM | POA: Diagnosis not present

## 2022-10-30 DIAGNOSIS — M109 Gout, unspecified: Secondary | ICD-10-CM | POA: Diagnosis not present

## 2022-10-30 DIAGNOSIS — Z96651 Presence of right artificial knee joint: Secondary | ICD-10-CM | POA: Diagnosis not present

## 2022-11-02 DIAGNOSIS — N189 Chronic kidney disease, unspecified: Secondary | ICD-10-CM | POA: Diagnosis not present

## 2022-11-02 DIAGNOSIS — Z96651 Presence of right artificial knee joint: Secondary | ICD-10-CM | POA: Diagnosis not present

## 2022-11-02 DIAGNOSIS — Z471 Aftercare following joint replacement surgery: Secondary | ICD-10-CM | POA: Diagnosis not present

## 2022-11-02 DIAGNOSIS — M109 Gout, unspecified: Secondary | ICD-10-CM | POA: Diagnosis not present

## 2022-11-02 DIAGNOSIS — D631 Anemia in chronic kidney disease: Secondary | ICD-10-CM | POA: Diagnosis not present

## 2022-11-02 DIAGNOSIS — K219 Gastro-esophageal reflux disease without esophagitis: Secondary | ICD-10-CM | POA: Diagnosis not present

## 2022-11-02 DIAGNOSIS — F3342 Major depressive disorder, recurrent, in full remission: Secondary | ICD-10-CM | POA: Diagnosis not present

## 2022-11-02 DIAGNOSIS — I129 Hypertensive chronic kidney disease with stage 1 through stage 4 chronic kidney disease, or unspecified chronic kidney disease: Secondary | ICD-10-CM | POA: Diagnosis not present

## 2022-11-02 DIAGNOSIS — G5603 Carpal tunnel syndrome, bilateral upper limbs: Secondary | ICD-10-CM | POA: Diagnosis not present

## 2022-11-03 DIAGNOSIS — M25561 Pain in right knee: Secondary | ICD-10-CM | POA: Diagnosis not present

## 2022-11-03 DIAGNOSIS — M25661 Stiffness of right knee, not elsewhere classified: Secondary | ICD-10-CM | POA: Diagnosis not present

## 2022-11-03 DIAGNOSIS — G8929 Other chronic pain: Secondary | ICD-10-CM | POA: Diagnosis not present

## 2022-11-03 DIAGNOSIS — Z96651 Presence of right artificial knee joint: Secondary | ICD-10-CM | POA: Diagnosis not present

## 2022-11-03 DIAGNOSIS — R531 Weakness: Secondary | ICD-10-CM | POA: Diagnosis not present

## 2022-11-10 DIAGNOSIS — Z96651 Presence of right artificial knee joint: Secondary | ICD-10-CM | POA: Diagnosis not present

## 2022-11-10 DIAGNOSIS — R531 Weakness: Secondary | ICD-10-CM | POA: Diagnosis not present

## 2022-11-10 DIAGNOSIS — G8929 Other chronic pain: Secondary | ICD-10-CM | POA: Diagnosis not present

## 2022-11-10 DIAGNOSIS — M25661 Stiffness of right knee, not elsewhere classified: Secondary | ICD-10-CM | POA: Diagnosis not present

## 2022-11-10 DIAGNOSIS — M25561 Pain in right knee: Secondary | ICD-10-CM | POA: Diagnosis not present

## 2022-11-12 DIAGNOSIS — G8929 Other chronic pain: Secondary | ICD-10-CM | POA: Diagnosis not present

## 2022-11-12 DIAGNOSIS — M25661 Stiffness of right knee, not elsewhere classified: Secondary | ICD-10-CM | POA: Diagnosis not present

## 2022-11-12 DIAGNOSIS — M25561 Pain in right knee: Secondary | ICD-10-CM | POA: Diagnosis not present

## 2022-11-12 DIAGNOSIS — R531 Weakness: Secondary | ICD-10-CM | POA: Diagnosis not present

## 2022-11-12 DIAGNOSIS — Z96651 Presence of right artificial knee joint: Secondary | ICD-10-CM | POA: Diagnosis not present

## 2022-11-16 DIAGNOSIS — Z96651 Presence of right artificial knee joint: Secondary | ICD-10-CM | POA: Diagnosis not present

## 2022-11-16 DIAGNOSIS — M25661 Stiffness of right knee, not elsewhere classified: Secondary | ICD-10-CM | POA: Diagnosis not present

## 2022-11-16 DIAGNOSIS — G8929 Other chronic pain: Secondary | ICD-10-CM | POA: Diagnosis not present

## 2022-11-16 DIAGNOSIS — M25561 Pain in right knee: Secondary | ICD-10-CM | POA: Diagnosis not present

## 2022-11-16 DIAGNOSIS — R531 Weakness: Secondary | ICD-10-CM | POA: Diagnosis not present

## 2022-11-23 ENCOUNTER — Other Ambulatory Visit: Payer: Self-pay | Admitting: Psychiatry

## 2022-11-23 DIAGNOSIS — F3342 Major depressive disorder, recurrent, in full remission: Secondary | ICD-10-CM

## 2022-11-23 DIAGNOSIS — F411 Generalized anxiety disorder: Secondary | ICD-10-CM

## 2022-11-26 ENCOUNTER — Telehealth: Payer: Medicare HMO | Admitting: Psychiatry

## 2022-12-28 ENCOUNTER — Encounter: Payer: Self-pay | Admitting: Psychiatry

## 2022-12-28 ENCOUNTER — Telehealth (INDEPENDENT_AMBULATORY_CARE_PROVIDER_SITE_OTHER): Payer: Medicare HMO | Admitting: Psychiatry

## 2022-12-28 DIAGNOSIS — F330 Major depressive disorder, recurrent, mild: Secondary | ICD-10-CM | POA: Diagnosis not present

## 2022-12-28 DIAGNOSIS — F411 Generalized anxiety disorder: Secondary | ICD-10-CM

## 2022-12-28 DIAGNOSIS — F5101 Primary insomnia: Secondary | ICD-10-CM

## 2022-12-28 MED ORDER — BUPROPION HCL ER (XL) 150 MG PO TB24: 150.0000 mg | ORAL_TABLET | Freq: Every morning | ORAL | 0 refills | Status: DC

## 2022-12-28 MED ORDER — DULOXETINE HCL 60 MG PO CPEP: 60.0000 mg | ORAL_CAPSULE | Freq: Every day | ORAL | 1 refills | Status: DC
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
## 2023-01-27 ENCOUNTER — Other Ambulatory Visit: Payer: Self-pay | Admitting: Psychiatry

## 2023-01-27 ENCOUNTER — Telehealth: Payer: Self-pay

## 2023-01-27 DIAGNOSIS — F5101 Primary insomnia: Secondary | ICD-10-CM

## 2023-01-27 MED ORDER — ZOLPIDEM TARTRATE 10 MG PO TABS: 5.0000 mg | ORAL_TABLET | Freq: Every evening | ORAL | 0 refills | Status: DC | PRN

## 2023-01-27 NOTE — Telephone Encounter (Signed)
Medication manangement - Telephone call with patient to inform Dr. Elna Breslow was out today but Dr. Jerold Coombe had sent in her requested new Zolpidem order to her [facility] in [facility]. Pt to call back if any issues.

## 2023-01-27 NOTE — Telephone Encounter (Signed)
Medication refill - Call message from patient requesting a new Zolpidem 10 mg order be sent into her [facility] in [place], last provided 10/28/22 for 90 days. Pt. stated when she was recently seen by Dr. Elna Breslow 12/28/22 she thought a new order would be sent in then but the order was not at her pharmacy. Patient requests this order be sent in as soon as possible as she is leaving to go out of town later today.

## 2023-01-27 NOTE — Telephone Encounter (Signed)
Rx sent 

## 2023-02-08 ENCOUNTER — Other Ambulatory Visit: Payer: Self-pay | Admitting: Psychiatry

## 2023-02-08 DIAGNOSIS — F411 Generalized anxiety disorder: Secondary | ICD-10-CM

## 2023-02-08 DIAGNOSIS — F3342 Major depressive disorder, recurrent, in full remission: Secondary | ICD-10-CM

## 2023-02-15 ENCOUNTER — Telehealth: Payer: Self-pay

## 2023-02-15 DIAGNOSIS — F5101 Primary insomnia: Secondary | ICD-10-CM

## 2023-02-15 MED ORDER — ZOLPIDEM TARTRATE 10 MG PO TABS: 5.0000 mg | ORAL_TABLET | Freq: Every evening | ORAL | 0 refills | Status: DC | PRN
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
## 2023-02-15 NOTE — Telephone Encounter (Signed)
sent a message to front desk staff to call patient to set up a follow up appt. pt was set up for 8-26

## 2023-02-15 NOTE — Telephone Encounter (Signed)
Patient reported her Ambien was lost while she was at the hospital with her daughter.  I have sent a 30-day supply of Ambien to help pharmacy.

## 2023-02-15 NOTE — Telephone Encounter (Signed)
Noted  

## 2023-03-15 ENCOUNTER — Other Ambulatory Visit: Payer: Self-pay | Admitting: Psychiatry

## 2023-03-15 DIAGNOSIS — F5101 Primary insomnia: Secondary | ICD-10-CM

## 2023-03-22 ENCOUNTER — Encounter: Payer: Self-pay | Admitting: Psychiatry

## 2023-03-22 ENCOUNTER — Telehealth: Payer: Medicare HMO | Admitting: Psychiatry

## 2023-03-22 DIAGNOSIS — F3342 Major depressive disorder, recurrent, in full remission: Secondary | ICD-10-CM | POA: Diagnosis not present

## 2023-03-22 DIAGNOSIS — F5101 Primary insomnia: Secondary | ICD-10-CM

## 2023-03-22 DIAGNOSIS — F411 Generalized anxiety disorder: Secondary | ICD-10-CM | POA: Diagnosis not present

## 2023-03-22 MED ORDER — ZOLPIDEM TARTRATE 10 MG PO TABS
5.0000 mg | ORAL_TABLET | Freq: Every evening | ORAL | 0 refills | Status: AC | PRN
Start: 2023-03-22 — End: ?

## 2023-03-22 MED ORDER — BUPROPION HCL ER (XL) 150 MG PO TB24
150.0000 mg | ORAL_TABLET | Freq: Every morning | ORAL | 0 refills | Status: DC
Start: 2023-03-22 — End: 2023-06-18

## 2023-03-22 MED ORDER — BUSPIRONE HCL 10 MG PO TABS
20.0000 mg | ORAL_TABLET | Freq: Two times a day (BID) | ORAL | 1 refills | Status: DC
Start: 2023-03-22 — End: 2023-09-29
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
## 2023-04-01 ENCOUNTER — Other Ambulatory Visit: Payer: Self-pay | Admitting: Psychiatry

## 2023-04-01 DIAGNOSIS — F411 Generalized anxiety disorder: Secondary | ICD-10-CM

## 2023-04-01 DIAGNOSIS — F3342 Major depressive disorder, recurrent, in full remission: Secondary | ICD-10-CM

## 2023-04-28 DIAGNOSIS — F5101 Primary insomnia: Secondary | ICD-10-CM

## 2023-04-28 MED ORDER — ZOLPIDEM TARTRATE 10 MG PO TABS
5.0000 mg | ORAL_TABLET | Freq: Every evening | ORAL | 0 refills | Status: DC | PRN
Start: 2023-04-28 — End: 2023-07-23

## 2023-04-28 NOTE — Telephone Encounter (Signed)
I have sent zolpidem 10 mg tablet, 90 days supply to pharmacy as requested.

## 2023-05-13 ENCOUNTER — Other Ambulatory Visit: Payer: Self-pay | Admitting: Psychiatry

## 2023-05-13 DIAGNOSIS — F3342 Major depressive disorder, recurrent, in full remission: Secondary | ICD-10-CM

## 2023-05-13 DIAGNOSIS — F411 Generalized anxiety disorder: Secondary | ICD-10-CM

## 2023-06-18 ENCOUNTER — Other Ambulatory Visit: Payer: Self-pay | Admitting: Psychiatry

## 2023-06-18 DIAGNOSIS — F3342 Major depressive disorder, recurrent, in full remission: Secondary | ICD-10-CM

## 2023-06-24 ENCOUNTER — Other Ambulatory Visit: Payer: Self-pay | Admitting: Psychiatry

## 2023-06-24 DIAGNOSIS — F411 Generalized anxiety disorder: Secondary | ICD-10-CM

## 2023-07-23 ENCOUNTER — Telehealth: Payer: Self-pay

## 2023-07-23 DIAGNOSIS — F5101 Primary insomnia: Secondary | ICD-10-CM

## 2023-07-23 MED ORDER — ZOLPIDEM TARTRATE 10 MG PO TABS: 5.0000 mg | ORAL_TABLET | Freq: Every evening | ORAL | 0 refills | Status: DC | PRN

## 2023-07-23 NOTE — Addendum Note (Signed)
Addended by: Thresa Ross on: 07/23/2023 08:57 AM   Modules accepted: Orders

## 2023-07-23 NOTE — Telephone Encounter (Signed)
Called patient to make aware that Ambien 10 mg has been sent to her preferred pharmacy

## 2023-07-23 NOTE — Telephone Encounter (Signed)
Patient called in to request a refill for the following medication please advise  zolpidem (AMBIEN) 10 MG tablet   Last visit 03-22-23 Next visit 08-02-23  Preferred pharmacy  [id number], [place] - [address] Phone: [phone]  Fax: [phone]

## 2023-08-02 ENCOUNTER — Telehealth (INDEPENDENT_AMBULATORY_CARE_PROVIDER_SITE_OTHER): Payer: Medicare HMO | Admitting: Psychiatry

## 2023-08-02 ENCOUNTER — Encounter: Payer: Self-pay | Admitting: Psychiatry

## 2023-08-02 DIAGNOSIS — F5101 Primary insomnia: Secondary | ICD-10-CM | POA: Diagnosis not present

## 2023-08-02 DIAGNOSIS — F330 Major depressive disorder, recurrent, mild: Secondary | ICD-10-CM | POA: Diagnosis not present

## 2023-08-02 DIAGNOSIS — F411 Generalized anxiety disorder: Secondary | ICD-10-CM

## 2023-08-02 MED ORDER — DIAZEPAM 2 MG PO TABS
2.0000 mg | ORAL_TABLET | Freq: Every day | ORAL | 0 refills | Status: AC | PRN
Start: 2023-08-02 — End: 2023-09-01
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
## 2023-08-03 DIAGNOSIS — E790 Hyperuricemia without signs of inflammatory arthritis and tophaceous disease: Secondary | ICD-10-CM | POA: Diagnosis not present

## 2023-08-03 DIAGNOSIS — I10 Essential (primary) hypertension: Secondary | ICD-10-CM | POA: Diagnosis not present

## 2023-08-03 DIAGNOSIS — G4700 Insomnia, unspecified: Secondary | ICD-10-CM | POA: Diagnosis not present

## 2023-08-03 DIAGNOSIS — F411 Generalized anxiety disorder: Secondary | ICD-10-CM | POA: Diagnosis not present

## 2023-08-03 DIAGNOSIS — Z79899 Other long term (current) drug therapy: Secondary | ICD-10-CM | POA: Diagnosis not present

## 2023-08-03 DIAGNOSIS — M810 Age-related osteoporosis without current pathological fracture: Secondary | ICD-10-CM | POA: Diagnosis not present

## 2023-08-03 DIAGNOSIS — E039 Hypothyroidism, unspecified: Secondary | ICD-10-CM | POA: Diagnosis not present

## 2023-08-03 DIAGNOSIS — Z9884 Bariatric surgery status: Secondary | ICD-10-CM | POA: Diagnosis not present

## 2023-08-03 DIAGNOSIS — Z23 Encounter for immunization: Secondary | ICD-10-CM | POA: Diagnosis not present

## 2023-08-03 DIAGNOSIS — F331 Major depressive disorder, recurrent, moderate: Secondary | ICD-10-CM | POA: Diagnosis not present

## 2023-08-03 DIAGNOSIS — Z0000 Encounter for general adult medical examination without abnormal findings: Secondary | ICD-10-CM | POA: Diagnosis not present

## 2023-08-03 DIAGNOSIS — E559 Vitamin D deficiency, unspecified: Secondary | ICD-10-CM | POA: Diagnosis not present

## 2023-08-03 DIAGNOSIS — E538 Deficiency of other specified B group vitamins: Secondary | ICD-10-CM | POA: Diagnosis not present

## 2023-08-03 DIAGNOSIS — Z1331 Encounter for screening for depression: Secondary | ICD-10-CM | POA: Diagnosis not present

## 2023-08-03 DIAGNOSIS — K219 Gastro-esophageal reflux disease without esophagitis: Secondary | ICD-10-CM | POA: Diagnosis not present

## 2023-08-04 ENCOUNTER — Other Ambulatory Visit: Payer: Self-pay | Admitting: Nurse Practitioner

## 2023-08-04 DIAGNOSIS — Z1231 Encounter for screening mammogram for malignant neoplasm of breast: Secondary | ICD-10-CM

## 2023-09-25 ENCOUNTER — Other Ambulatory Visit: Payer: Self-pay | Admitting: Psychiatry

## 2023-09-25 DIAGNOSIS — F3342 Major depressive disorder, recurrent, in full remission: Secondary | ICD-10-CM

## 2023-09-25 DIAGNOSIS — F411 Generalized anxiety disorder: Secondary | ICD-10-CM

## 2023-09-29 ENCOUNTER — Other Ambulatory Visit: Payer: Self-pay | Admitting: Psychiatry

## 2023-09-29 DIAGNOSIS — F411 Generalized anxiety disorder: Secondary | ICD-10-CM

## 2023-10-07 DIAGNOSIS — M810 Age-related osteoporosis without current pathological fracture: Secondary | ICD-10-CM | POA: Diagnosis not present

## 2023-10-13 ENCOUNTER — Encounter: Payer: Self-pay | Admitting: Psychiatry

## 2023-10-13 ENCOUNTER — Ambulatory Visit (INDEPENDENT_AMBULATORY_CARE_PROVIDER_SITE_OTHER): Payer: Medicare HMO | Admitting: Psychiatry

## 2023-10-13 ENCOUNTER — Other Ambulatory Visit: Payer: Self-pay

## 2023-10-13 VITALS — BP 142/88 | HR 74 | Temp 95.9°F | Ht 62.0 in | Wt 146.6 lb

## 2023-10-13 DIAGNOSIS — F330 Major depressive disorder, recurrent, mild: Secondary | ICD-10-CM

## 2023-10-13 DIAGNOSIS — F411 Generalized anxiety disorder: Secondary | ICD-10-CM | POA: Diagnosis not present

## 2023-10-13 DIAGNOSIS — F5101 Primary insomnia: Secondary | ICD-10-CM | POA: Diagnosis not present

## 2023-10-13 MED ORDER — DULOXETINE HCL 20 MG PO CPEP: 20.0000 mg | ORAL_CAPSULE | Freq: Every day | ORAL | 0 refills | Status: DC

## 2023-10-13 MED ORDER — ZOLPIDEM TARTRATE 10 MG PO TABS: 5.0000 mg | ORAL_TABLET | Freq: Every evening | ORAL | 1 refills | Status: DC | PRN

## 2023-10-13 MED ORDER — DIAZEPAM 10 MG PO TABS: 5.0000 mg | ORAL_TABLET | Freq: Every day | ORAL | 0 refills | Status: DC | PRN
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
## 2023-10-14 DIAGNOSIS — M810 Age-related osteoporosis without current pathological fracture: Secondary | ICD-10-CM | POA: Diagnosis not present

## 2023-10-21 ENCOUNTER — Ambulatory Visit (INDEPENDENT_AMBULATORY_CARE_PROVIDER_SITE_OTHER): Payer: Medicare HMO | Admitting: Professional Counselor

## 2023-10-21 DIAGNOSIS — F411 Generalized anxiety disorder: Secondary | ICD-10-CM

## 2023-10-21 DIAGNOSIS — F330 Major depressive disorder, recurrent, mild: Secondary | ICD-10-CM | POA: Diagnosis not present
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
## 2023-10-26 DIAGNOSIS — M810 Age-related osteoporosis without current pathological fracture: Secondary | ICD-10-CM | POA: Diagnosis not present

## 2023-11-04 ENCOUNTER — Ambulatory Visit: Admitting: Professional Counselor

## 2023-11-05 ENCOUNTER — Other Ambulatory Visit: Payer: Self-pay | Admitting: Psychiatry

## 2023-11-05 DIAGNOSIS — F411 Generalized anxiety disorder: Secondary | ICD-10-CM

## 2023-12-07 ENCOUNTER — Ambulatory Visit: Admitting: Professional Counselor

## 2023-12-08 ENCOUNTER — Other Ambulatory Visit: Payer: Self-pay | Admitting: Psychiatry

## 2023-12-08 DIAGNOSIS — F411 Generalized anxiety disorder: Secondary | ICD-10-CM

## 2024-01-08 ENCOUNTER — Other Ambulatory Visit: Payer: Self-pay | Admitting: Psychiatry

## 2024-01-08 DIAGNOSIS — F330 Major depressive disorder, recurrent, mild: Secondary | ICD-10-CM

## 2024-01-08 DIAGNOSIS — F411 Generalized anxiety disorder: Secondary | ICD-10-CM

## 2024-01-09 ENCOUNTER — Telehealth: Payer: Self-pay | Admitting: Psychiatry

## 2024-01-09 NOTE — Telephone Encounter (Signed)
Please contact patient for an appointment 

## 2024-01-25 ENCOUNTER — Telehealth: Admitting: Psychiatry

## 2024-01-25 ENCOUNTER — Encounter: Payer: Self-pay | Admitting: Psychiatry

## 2024-01-25 DIAGNOSIS — F330 Major depressive disorder, recurrent, mild: Secondary | ICD-10-CM | POA: Diagnosis not present

## 2024-01-25 DIAGNOSIS — F411 Generalized anxiety disorder: Secondary | ICD-10-CM

## 2024-01-25 DIAGNOSIS — F5101 Primary insomnia: Secondary | ICD-10-CM | POA: Diagnosis not present
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
## 2024-02-06 ENCOUNTER — Other Ambulatory Visit: Payer: Self-pay | Admitting: Psychiatry

## 2024-02-06 DIAGNOSIS — F411 Generalized anxiety disorder: Secondary | ICD-10-CM

## 2024-03-04 ENCOUNTER — Other Ambulatory Visit: Payer: Self-pay | Admitting: Psychiatry

## 2024-03-04 DIAGNOSIS — F411 Generalized anxiety disorder: Secondary | ICD-10-CM

## 2024-03-10 ENCOUNTER — Telehealth: Payer: Self-pay

## 2024-03-10 DIAGNOSIS — F411 Generalized anxiety disorder: Secondary | ICD-10-CM

## 2024-03-10 NOTE — Telephone Encounter (Signed)
 called pharmacy why was only 5 pill given. per the pharmacist pt inusrance will only do 30 day supply. so that is why they gave only 5 pills. because rx was written must last 90 days

## 2024-03-10 NOTE — Telephone Encounter (Signed)
 pt called states that she picked up rx and they only gave her 5 pill and it says 8 refill must last 90 days. pt was last seen on 7-1 next appt 10-2

## 2024-03-10 NOTE — Telephone Encounter (Signed)
 called pt to explain and she states that she has been taking 1/2 pill every day because of her nerves. 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  Pt was told that dr. Coby was not in the office today and that because of the medication the on call provider may just want to wait until dr. Coby comes back into the office.  Pt states that she has enough until Monday.

## 2024-03-10 NOTE — Telephone Encounter (Signed)
 Please notify the patient about this. Thanks.

## 2024-03-10 NOTE — Telephone Encounter (Signed)
 The prescription indicates 15 tablets for 90 days, which comes out to 5 tablets per month. This likely reflects an as needed use, which seems consistent with Dr. Senora original intent. Will route this message to Dr. Eappen when she returns next week for confirmation. Please advise her that If there are any changes to the plan, we'll update her accordingly.

## 2024-03-10 NOTE — Telephone Encounter (Signed)
 Noted, will route this to Dr. Coby.

## 2024-03-13 NOTE — Telephone Encounter (Signed)
 Please let patient know that I do not recommend increasing the dosage of Valium  due to long-term side effects.  I do not recommend that she use this medication daily.  If she is in need for further medication changes please advise her to schedule a follow-up appointment soon.

## 2024-03-13 NOTE — Telephone Encounter (Signed)
 Please clarify with pharmacy how we can fix this since the prescription that I sent out says 15 pills and not 8 pills.

## 2024-03-13 NOTE — Telephone Encounter (Signed)
Called patient to inform of the message from provider no answer left voicemail for patient to return call to office

## 2024-03-13 NOTE — Telephone Encounter (Signed)
 Patient returned call she stated that she is not requesting an increase she is requesting that the prescription be written the way it was previously due to the cost she is on a limited income she can not afford the $45 please advise  15 pills with 2 refills only cost her $5  5 pills with 8 refills cost her $45

## 2024-03-15 MED ORDER — DIAZEPAM 10 MG PO TABS: 5.0000 mg | ORAL_TABLET | Freq: Every day | ORAL | 0 refills | Status: DC | PRN

## 2024-03-15 NOTE — Telephone Encounter (Signed)
 Called pharmacy spoke to SJ she stated that she would need a new Rx that says  15 tablets for 30 days with the same sig

## 2024-03-15 NOTE — Telephone Encounter (Signed)
 I have sent a new prescription with 15 tablets for Valium  .  Will have staff contact pharmacy to cancel any pending refills on the previous prescription for Valium  which was sent out for a 90-day supply.

## 2024-03-16 NOTE — Telephone Encounter (Signed)
 Called the pharmacy spoke to Zack he stated that he would cancel the Rx for 90 days

## 2024-03-28 ENCOUNTER — Other Ambulatory Visit: Payer: Self-pay | Admitting: Psychiatry

## 2024-03-28 DIAGNOSIS — F411 Generalized anxiety disorder: Secondary | ICD-10-CM

## 2024-04-05 ENCOUNTER — Other Ambulatory Visit: Payer: Self-pay | Admitting: Psychiatry

## 2024-04-05 DIAGNOSIS — F411 Generalized anxiety disorder: Secondary | ICD-10-CM

## 2024-04-05 DIAGNOSIS — F330 Major depressive disorder, recurrent, mild: Secondary | ICD-10-CM

## 2024-04-11 ENCOUNTER — Other Ambulatory Visit: Payer: Self-pay | Admitting: Psychiatry

## 2024-04-11 ENCOUNTER — Telehealth: Payer: Self-pay

## 2024-04-11 DIAGNOSIS — F411 Generalized anxiety disorder: Secondary | ICD-10-CM

## 2024-04-11 DIAGNOSIS — F5101 Primary insomnia: Secondary | ICD-10-CM

## 2024-04-11 MED ORDER — ZOLPIDEM TARTRATE 10 MG PO TABS: 5.0000 mg | ORAL_TABLET | Freq: Every evening | ORAL | 1 refills | Status: AC | PRN

## 2024-04-11 NOTE — Addendum Note (Signed)
 Addended byBETHA COBY HEIGHT on: 04/11/2024 05:52 PM   Modules accepted: Orders

## 2024-04-11 NOTE — Telephone Encounter (Signed)
 Patient called requesting that a new Rx be sent to her pharmacy for diazepam  (VALIUM ) 10 MG tablet and also zolpidem  (AMBIEN ) 10 MG tablet (Expired)   Last visit 01-25-24  Next visit 10.2.25    Preferred Pharmacies   [id number] - [place], [place] - [address] Phone: [phone]  Fax: [phone]

## 2024-04-11 NOTE — Telephone Encounter (Signed)
 I have sent Ambien  to pharmacy as requested.  Valium  is too soon to be refilled.  She got a prescription for 5 tablets on 03/07/2024 and another 1 for 15 tablets on 03/16/2024.  So she got 20 tablets which is more than what she usually gets for 30 days.  Will have CMA contact patient to clarify how much Valium  she is taking.  Patient to limit the use of Valium .  I will not recommend using this medication daily.

## 2024-04-12 NOTE — Telephone Encounter (Signed)
 Spoke to patient she stated that she only taking 1/2 of a pill but August and September was hard for her she stated the last time she picked it up was was August 21 for 15 pills

## 2024-04-12 NOTE — Telephone Encounter (Signed)
 I have sent diazepam  with date specified to pharmacy.

## 2024-04-14 ENCOUNTER — Ambulatory Visit (INDEPENDENT_AMBULATORY_CARE_PROVIDER_SITE_OTHER): Admitting: Professional Counselor

## 2024-04-14 DIAGNOSIS — F411 Generalized anxiety disorder: Secondary | ICD-10-CM

## 2024-04-14 DIAGNOSIS — F330 Major depressive disorder, recurrent, mild: Secondary | ICD-10-CM | POA: Diagnosis not present

## 2024-04-14 NOTE — Progress Notes (Signed)
  THERAPIST PROGRESS NOTE  Virtual Visit via Video Note  I connected with Deborah Jimenez on 04/15/24 at  9:00 AM EDT by a video enabled telemedicine application and verified that I am speaking with the correct person using two identifiers.  Location: Patient: Home Provider: Remote office   I discussed the limitations of evaluation and management by telemedicine and the availability of in person appointments. The patient expressed understanding and agreed to proceed.  I discussed the assessment and treatment plan with the patient. The patient was provided an opportunity to ask questions and all were answered. The patient agreed with the plan and demonstrated an understanding of the instructions.   The patient was advised to call back or seek an in-person evaluation if the symptoms worsen or if the condition fails to improve as anticipated.  I provided 40 minutes of non-face-to-face time during this encounter. Deborah Jimenez, [facility]  Session Time: 9:06 AM - 9:46 AM   Participation Level: Active  Behavioral Response: CasualAlertAnxious and Dysphoric  Type of Therapy: Individual Therapy  Treatment Goals addressed: Active OP Depression  LTG: A lot of the 18 months, I feel alone taking care of my daughter. I've been married 24 years and I don't feel married. I don't want to go back to what he thinks our normal was. I get a little more freedom here.     Start:  04/14/24    Expected End:  04/13/25     STG: To improve sense of self AEB identifying VITALS and action plan to support those over the next 90 days.   STG: To reduce sxs of anxiety and depression AEB reduction in GAD7 and PHQ9 scores over the next 12 weeks.   ProgressTowards Goals: Initial  Interventions: Motivational Interviewing and Supportive  Summary: Deborah Jimenez is a 68 y.o. female who presents with a history of anxiety and depression. She appeared alert and oriented x5. She shared updates over the  last few months since her initial CCA. This included updates with her daughter and daughter's healing process. This also included updates within her marriage. Deborah Jimenez engaged in developing her treatment plan and would like to work on reducing symptoms of anxiety and depression and improving sense of self.  Therapist Response: Conducted session with [name]. Began session with check-in/update since previous session. Utilized empathetic and reflective listening. Used open-ended questions to facilitate discussion and summarized Deborah Jimenez's thoughts/feelings. Developed treatment plan with input from [place] on current strengths and needs. Scheduled additional appointment and concluded session.   Suicidal/Homicidal: No  Plan: Return again in 3 weeks.  Diagnosis: MDD (major depressive disorder), recurrent episode, mild (HCC)  GAD (generalized anxiety disorder)  Collaboration of Care: Medication Management AEB chart review  Patient/Guardian was advised Release of Information must be obtained prior to any record release in order to collaborate their care with an outside provider. Patient/Guardian was advised if they have not already done so to contact the registration department to sign all necessary forms in order for us  to release information regarding their care.   Consent: Patient/Guardian gives verbal consent for treatment and assignment of benefits for services provided during this visit. Patient/Guardian expressed understanding and agreed to proceed.   Deborah Jimenez, [facility] 04/15/2024

## 2024-04-27 ENCOUNTER — Telehealth: Admitting: Psychiatry

## 2024-04-27 ENCOUNTER — Encounter: Payer: Self-pay | Admitting: Psychiatry

## 2024-04-27 DIAGNOSIS — F411 Generalized anxiety disorder: Secondary | ICD-10-CM | POA: Diagnosis not present

## 2024-04-27 DIAGNOSIS — F330 Major depressive disorder, recurrent, mild: Secondary | ICD-10-CM | POA: Diagnosis not present

## 2024-04-27 DIAGNOSIS — F5101 Primary insomnia: Secondary | ICD-10-CM

## 2024-04-27 MED ORDER — DIAZEPAM 10 MG PO TABS: 5.0000 mg | ORAL_TABLET | Freq: Every day | ORAL | 5 refills | Status: AC | PRN
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
## 2024-05-01 ENCOUNTER — Ambulatory Visit (INDEPENDENT_AMBULATORY_CARE_PROVIDER_SITE_OTHER): Admitting: Professional Counselor

## 2024-05-01 DIAGNOSIS — F330 Major depressive disorder, recurrent, mild: Secondary | ICD-10-CM

## 2024-05-01 DIAGNOSIS — F411 Generalized anxiety disorder: Secondary | ICD-10-CM | POA: Diagnosis not present

## 2024-05-01 NOTE — Progress Notes (Unsigned)
  THERAPIST PROGRESS NOTE  Virtual Visit via Video Note  I connected with Deborah Jimenez on 05/02/24 at  2:00 PM EDT by a video enabled telemedicine application and verified that I am speaking with the correct person using two identifiers.  Location: Patient: Home Provider: Remote office   I discussed the limitations of evaluation and management by telemedicine and the availability of in person appointments. The patient expressed understanding and agreed to proceed.   I discussed the assessment and treatment plan with the patient. The patient was provided an opportunity to ask questions and all were answered. The patient agreed with the plan and demonstrated an understanding of the instructions.   The patient was advised to call back or seek an in-person evaluation if the symptoms worsen or if the condition fails to improve as anticipated.  I provided 41 minutes of non-face-to-face time during this encounter. Deborah Jimenez, [facility]  Session Time: 2:00 PM - 2:41 PM  Participation Level: Active  Behavioral Response: Casual, Alert, Anxious  Type of Therapy: Individual Therapy  Treatment Goals addressed:  Active OP Depression  LTG: A lot of the 18 months, I feel alone taking care of my daughter. I've been married 24 years and I don't feel married. I don't want to go back to what he thinks our normal was. I get a little more freedom here.                 Start:  04/14/24    Expected End:  04/13/25      STG: To improve sense of self AEB identifying VITALS and action plan to support those over the next 90 days.    STG: To reduce sxs of anxiety and depression AEB reduction in GAD7 and PHQ9 scores over the next 12 weeks.   ProgressTowards Goals: Progressing  Interventions: Motivational Interviewing and Supportive  Summary: Deborah Jimenez is a 67 y.o. female who presents with a history of anxiety and depression. She appeared alert and oriented x5. She shared some  paintings she has done lately. Deborah Jimenez enjoys doing this as a hobby. She shared updates about her daughter. Deborah Jimenez noted her daughter has been going to therapy and she has been asked to attend the next session. She expressed some anxiety around this. She also continues to worry about what life will look like when she returns to her home and no longer has to help her daughter daily.   Therapist Response: Conducted session with [name]. Began session with check-in/update since previous session. Utilized empathetic and reflective listening. Used open-ended questions to facilitate discussion and summarized Deborah Jimenez's thoughts/feelings. Explored ways to respond and show up with daughter/therapy. Scheduled additional appointment and concluded session.   Suicidal/Homicidal: No  Plan: Return again in 3 weeks.  Diagnosis: GAD (generalized anxiety disorder)  MDD (major depressive disorder), recurrent episode, mild  Collaboration of Care: Medication Management AEB chart review  Patient/Guardian was advised Release of Information must be obtained prior to any record release in order to collaborate their care with an outside provider. Patient/Guardian was advised if they have not already done so to contact the registration department to sign all necessary forms in order for us  to release information regarding their care.   Consent: Patient/Guardian gives verbal consent for treatment and assignment of benefits for services provided during this visit. Patient/Guardian expressed understanding and agreed to proceed.   Deborah Jimenez, [facility] 05/02/2024

## 2024-05-22 ENCOUNTER — Ambulatory Visit: Admitting: Professional Counselor

## 2024-06-19 ENCOUNTER — Ambulatory Visit (INDEPENDENT_AMBULATORY_CARE_PROVIDER_SITE_OTHER): Admitting: Professional Counselor

## 2024-06-19 DIAGNOSIS — F330 Major depressive disorder, recurrent, mild: Secondary | ICD-10-CM | POA: Diagnosis not present

## 2024-06-19 DIAGNOSIS — F411 Generalized anxiety disorder: Secondary | ICD-10-CM

## 2024-06-19 NOTE — Progress Notes (Unsigned)
  THERAPIST PROGRESS NOTE  Virtual Visit via Video Note  I connected with Deborah Jimenez on 06/19/24 at  2:00 PM EST by a video enabled telemedicine application and verified that I am speaking with the correct person using two identifiers.  Location: Patient: Home Provider: Remote office   I discussed the limitations of evaluation and management by telemedicine and the availability of in person appointments. The patient expressed understanding and agreed to proceed.   I discussed the assessment and treatment plan with the patient. The patient was provided an opportunity to ask questions and all were answered. The patient agreed with the plan and demonstrated an understanding of the instructions.   The patient was advised to call back or seek an in-person evaluation if the symptoms worsen or if the condition fails to improve as anticipated.  I provided 35 minutes of non-face-to-face time during this encounter. Deborah Jimenez, [facility]  Session Time: 2:00 PM - 2:35 PM   Participation Level: Active  Behavioral Response: Casual, Alert, Dysphoric  Type of Therapy: Individual Therapy  Treatment Goals addressed:  Active OP Depression  LTG: A lot of the 18 months, I feel alone taking care of my daughter. I've been married 24 years and I don't feel married. I don't want to go back to what he thinks our normal was. I get a little more freedom here.                 Start:  04/14/24    Expected End:  04/13/25      STG: To improve sense of self AEB identifying VITALS and action plan to support those over the next 90 days.    STG: To reduce sxs of anxiety and depression AEB reduction in GAD7 and PHQ9 scores over the next 12 weeks.   ProgressTowards Goals: Progressing  Interventions: Motivational Interviewing, Conservator, Museum/gallery, and Supportive  Summary: Deborah Jimenez is a 68 y.o. female who presents with a history of anxiety and depression. She reported she and her  daughter have pneumonia and bronchitis. She expressed frustration with her husband's behavior. [name] engaged in discussion about how she can be more assertive/direct with him about her expectations/boundaries. She provided additional updates with her daughter's condition/employment.   Therapist Response: Conducted session with [name]. Began session with check-in/update since previous session. Utilized empathetic and reflective listening. Used open-ended questions to facilitate discussion and summarized Deborah Jimenez's thoughts/feelings. Explored ways to be assertive and more direct with communication. Scheduled additional appointment and concluded session.   Suicidal/Homicidal: No  Plan: Return again in 4 weeks.  Diagnosis: GAD (generalized anxiety disorder)  MDD (major depressive disorder), recurrent episode, mild  Collaboration of Care: Medication Management AEB chart review  Patient/Guardian was advised Release of Information must be obtained prior to any record release in order to collaborate their care with an outside provider. Patient/Guardian was advised if they have not already done so to contact the registration department to sign all necessary forms in order for us  to release information regarding their care.   Consent: Patient/Guardian gives verbal consent for treatment and assignment of benefits for services provided during this visit. Patient/Guardian expressed understanding and agreed to proceed.   Deborah Jimenez, [facility] 06/19/2024

## 2024-06-29 ENCOUNTER — Telehealth: Payer: Self-pay

## 2024-06-29 DIAGNOSIS — F411 Generalized anxiety disorder: Secondary | ICD-10-CM

## 2024-06-29 MED ORDER — DULOXETINE HCL 60 MG PO CPEP: 60.0000 mg | ORAL_CAPSULE | Freq: Every day | ORAL | 3 refills | Status: AC

## 2024-06-29 NOTE — Addendum Note (Signed)
 Addended byBETHA COBY HEIGHT on: 06/29/2024 03:25 PM   Modules accepted: Orders

## 2024-06-29 NOTE — Telephone Encounter (Signed)
 Cymbalta  has been approved and sent to pharmacy as requested.

## 2024-06-29 NOTE — Telephone Encounter (Signed)
 Patients pharmacy is requesting a new prescription for DULoxetine  (CYMBALTA ) 60 MG capsule  To be sent to    Preferred Pharmacies   [id number],  - [address] Phone: [phone]  Fax: [phone]

## 2024-07-13 ENCOUNTER — Ambulatory Visit: Admitting: Psychiatry

## 2024-07-17 ENCOUNTER — Ambulatory Visit: Admitting: Professional Counselor

## 2024-08-02 ENCOUNTER — Telehealth: Admitting: Psychiatry

## 2024-08-02 DIAGNOSIS — F411 Generalized anxiety disorder: Secondary | ICD-10-CM

## 2024-08-02 DIAGNOSIS — F5101 Primary insomnia: Secondary | ICD-10-CM

## 2024-08-02 DIAGNOSIS — F330 Major depressive disorder, recurrent, mild: Secondary | ICD-10-CM

## 2024-08-02 NOTE — Progress Notes (Signed)
 Patient had connection problems and hence this session could not be completed.  Patient agrees to schedule an in person visit, however would like to figure out transportation and will call the office to reschedule this appointment.

## 2024-08-07 ENCOUNTER — Other Ambulatory Visit: Payer: Self-pay | Admitting: Nurse Practitioner

## 2024-08-07 DIAGNOSIS — Z1231 Encounter for screening mammogram for malignant neoplasm of breast: Secondary | ICD-10-CM

## 2024-08-15 ENCOUNTER — Other Ambulatory Visit: Payer: Self-pay | Admitting: Psychiatry

## 2024-08-15 DIAGNOSIS — F3342 Major depressive disorder, recurrent, in full remission: Secondary | ICD-10-CM
